# Patient Record
Sex: Female | Born: 1940 | Race: White | Hispanic: No | Smoking: Former smoker
Health system: Southern US, Community
[De-identification: ages and names within clinical notes are randomized; demographics above are authoritative.]

## PROBLEM LIST (undated history)

## (undated) DIAGNOSIS — I272 Pulmonary hypertension, unspecified: Secondary | ICD-10-CM

## (undated) DIAGNOSIS — E785 Hyperlipidemia, unspecified: Secondary | ICD-10-CM

## (undated) DIAGNOSIS — M169 Osteoarthritis of hip, unspecified: Secondary | ICD-10-CM

## (undated) DIAGNOSIS — I10 Essential (primary) hypertension: Secondary | ICD-10-CM

## (undated) DIAGNOSIS — Z9889 Other specified postprocedural states: Secondary | ICD-10-CM

## (undated) DIAGNOSIS — M19019 Primary osteoarthritis, unspecified shoulder: Secondary | ICD-10-CM

## (undated) DIAGNOSIS — G5600 Carpal tunnel syndrome, unspecified upper limb: Secondary | ICD-10-CM

## (undated) DIAGNOSIS — I340 Nonrheumatic mitral (valve) insufficiency: Secondary | ICD-10-CM

## (undated) DIAGNOSIS — I4891 Unspecified atrial fibrillation: Secondary | ICD-10-CM

## (undated) DIAGNOSIS — R600 Localized edema: Secondary | ICD-10-CM

## (undated) DIAGNOSIS — I38 Endocarditis, valve unspecified: Secondary | ICD-10-CM

## (undated) DIAGNOSIS — I5022 Chronic systolic (congestive) heart failure: Secondary | ICD-10-CM

## (undated) DIAGNOSIS — L03115 Cellulitis of right lower limb: Secondary | ICD-10-CM

## (undated) DIAGNOSIS — I872 Venous insufficiency (chronic) (peripheral): Secondary | ICD-10-CM

## (undated) HISTORY — DX: Chronic systolic (congestive) heart failure: I50.22

## (undated) HISTORY — DX: Venous insufficiency (chronic) (peripheral): I87.2

## (undated) HISTORY — DX: Localized edema: R60.0

## (undated) HISTORY — DX: Nonrheumatic mitral (valve) insufficiency: I34.0

## (undated) HISTORY — DX: Osteoarthritis of hip, unspecified: M16.9

## (undated) HISTORY — DX: Cellulitis of right lower limb: L03.115

## (undated) HISTORY — DX: Primary osteoarthritis, unspecified shoulder: M19.019

## (undated) HISTORY — DX: Essential (primary) hypertension: I10

## (undated) HISTORY — DX: Other specified postprocedural states: Z98.890

## (undated) HISTORY — PX: OTHER SURGICAL HISTORY: SHX169

## (undated) HISTORY — DX: Carpal tunnel syndrome, unspecified upper limb: G56.00

## (undated) HISTORY — PX: ABDOMINAL HYSTERECTOMY: SHX81

## (undated) HISTORY — DX: Unspecified atrial fibrillation: I48.91

---

## 2007-09-29 ENCOUNTER — Ambulatory Visit: Payer: Self-pay | Admitting: Family Medicine

## 2008-08-26 ENCOUNTER — Ambulatory Visit: Payer: Self-pay | Admitting: Cardiology

## 2008-08-26 ENCOUNTER — Ambulatory Visit: Payer: Self-pay | Admitting: Otolaryngology

## 2008-09-30 ENCOUNTER — Ambulatory Visit: Payer: Self-pay | Admitting: Otolaryngology

## 2009-02-26 ENCOUNTER — Emergency Department: Payer: Self-pay | Admitting: Unknown Physician Specialty

## 2012-05-04 ENCOUNTER — Emergency Department: Payer: Self-pay | Admitting: Internal Medicine

## 2012-05-07 ENCOUNTER — Ambulatory Visit: Payer: Self-pay | Admitting: Specialist

## 2012-05-07 LAB — HEMOGLOBIN: HGB: 13.9 g/dL (ref 12.0–16.0)

## 2012-05-09 ENCOUNTER — Ambulatory Visit: Payer: Self-pay | Admitting: Specialist

## 2014-05-05 ENCOUNTER — Ambulatory Visit: Payer: Self-pay | Admitting: General Practice

## 2014-05-05 LAB — CBC
HCT: 41.2 % (ref 35.0–47.0)
HGB: 13.2 g/dL (ref 12.0–16.0)
MCH: 28.5 pg (ref 26.0–34.0)
MCHC: 32.1 g/dL (ref 32.0–36.0)
MCV: 89 fL (ref 80–100)
Platelet: 152 10*3/uL (ref 150–440)
RBC: 4.65 10*6/uL (ref 3.80–5.20)
RDW: 14.5 % (ref 11.5–14.5)
WBC: 6.6 10*3/uL (ref 3.6–11.0)

## 2014-05-05 LAB — BASIC METABOLIC PANEL
ANION GAP: 6 — AB (ref 7–16)
BUN: 13 mg/dL (ref 7–18)
CO2: 34 mmol/L — AB (ref 21–32)
CREATININE: 0.74 mg/dL (ref 0.60–1.30)
Calcium, Total: 8.7 mg/dL (ref 8.5–10.1)
Chloride: 96 mmol/L — ABNORMAL LOW (ref 98–107)
EGFR (Non-African Amer.): >60
GLUCOSE: 120 mg/dL — AB (ref 65–99)
Osmolality: 273 (ref 275–301)
POTASSIUM: 4.1 mmol/L (ref 3.5–5.1)
Sodium: 136 mmol/L (ref 136–145)

## 2014-05-05 LAB — SEDIMENTATION RATE: ERYTHROCYTE SED RATE: 10 mm/h (ref 0–30)

## 2014-05-05 LAB — URINALYSIS, COMPLETE
BILIRUBIN, UR: NEGATIVE
Glucose,UR: NEGATIVE mg/dL (ref 0–75)
Ketone: NEGATIVE
Nitrite: POSITIVE
PROTEIN: NEGATIVE
Ph: 6 (ref 4.5–8.0)
Specific Gravity: 1.01 (ref 1.003–1.030)

## 2014-05-05 LAB — PROTIME-INR
INR: 1.1
PROTHROMBIN TIME: 14.4 s (ref 11.5–14.7)

## 2014-05-05 LAB — APTT: Activated PTT: 28.3 secs (ref 23.6–35.9)

## 2014-05-05 LAB — MRSA PCR SCREENING

## 2014-05-07 LAB — URINE CULTURE

## 2014-05-18 DIAGNOSIS — I5022 Chronic systolic (congestive) heart failure: Secondary | ICD-10-CM | POA: Insufficient documentation

## 2014-05-18 DIAGNOSIS — I340 Nonrheumatic mitral (valve) insufficiency: Secondary | ICD-10-CM

## 2014-05-18 HISTORY — DX: Nonrheumatic mitral (valve) insufficiency: I34.0

## 2014-05-18 HISTORY — DX: Chronic systolic (congestive) heart failure: I50.22

## 2014-06-08 ENCOUNTER — Ambulatory Visit: Payer: Self-pay | Admitting: General Practice

## 2014-06-08 DIAGNOSIS — M169 Osteoarthritis of hip, unspecified: Secondary | ICD-10-CM

## 2014-06-08 HISTORY — DX: Osteoarthritis of hip, unspecified: M16.9

## 2014-06-08 HISTORY — PX: JOINT REPLACEMENT: SHX530

## 2014-06-08 LAB — BASIC METABOLIC PANEL
Anion Gap: 6 — ABNORMAL LOW (ref 7–16)
BUN: 19 mg/dL — AB (ref 7–18)
CALCIUM: 9.7 mg/dL (ref 8.5–10.1)
CO2: 36 mmol/L — AB (ref 21–32)
CREATININE: 0.83 mg/dL (ref 0.60–1.30)
Chloride: 94 mmol/L — ABNORMAL LOW (ref 98–107)
Glucose: 100 mg/dL — ABNORMAL HIGH (ref 65–99)
Osmolality: 274 (ref 275–301)
Potassium: 3.9 mmol/L (ref 3.5–5.1)
SODIUM: 136 mmol/L (ref 136–145)

## 2014-06-08 LAB — URINALYSIS, COMPLETE
BILIRUBIN, UR: NEGATIVE
BLOOD: NEGATIVE
Bacteria: NONE SEEN
Glucose,UR: NEGATIVE mg/dL (ref 0–75)
KETONE: NEGATIVE
LEUKOCYTE ESTERASE: NEGATIVE
Nitrite: NEGATIVE
PH: 5 (ref 4.5–8.0)
Protein: NEGATIVE
RBC,UR: <1 /HPF (ref 0–5)
SPECIFIC GRAVITY: 1.017 (ref 1.003–1.030)
Squamous Epithelial: 2

## 2014-06-08 LAB — CBC
HCT: 45.4 % (ref 35.0–47.0)
HGB: 14.6 g/dL (ref 12.0–16.0)
MCH: 28.8 pg (ref 26.0–34.0)
MCHC: 32.1 g/dL (ref 32.0–36.0)
MCV: 90 fL (ref 80–100)
PLATELETS: 162 10*3/uL (ref 150–440)
RBC: 5.07 10*6/uL (ref 3.80–5.20)
RDW: 14.8 % — AB (ref 11.5–14.5)
WBC: 7.6 10*3/uL (ref 3.6–11.0)

## 2014-06-08 LAB — APTT: Activated PTT: 28.6 secs (ref 23.6–35.9)

## 2014-06-08 LAB — SEDIMENTATION RATE: Erythrocyte Sed Rate: 14 mm/hr (ref 0–30)

## 2014-06-08 LAB — PROTIME-INR
INR: 1.1
PROTHROMBIN TIME: 13.9 s (ref 11.5–14.7)

## 2014-06-08 LAB — MRSA PCR SCREENING

## 2014-06-09 LAB — URINE CULTURE

## 2014-06-23 ENCOUNTER — Inpatient Hospital Stay: Payer: Self-pay | Admitting: General Practice

## 2014-06-24 LAB — BASIC METABOLIC PANEL
Anion Gap: 5 — ABNORMAL LOW (ref 7–16)
BUN: 10 mg/dL (ref 7–18)
CALCIUM: 8.2 mg/dL — AB (ref 8.5–10.1)
CHLORIDE: 96 mmol/L — AB (ref 98–107)
Co2: 31 mmol/L (ref 21–32)
Creatinine: 0.97 mg/dL (ref 0.60–1.30)
EGFR (African American): >60
GFR CALC NON AF AMER: 60 — AB
Glucose: 122 mg/dL — ABNORMAL HIGH (ref 65–99)
Osmolality: 265 (ref 275–301)
Potassium: 3.5 mmol/L (ref 3.5–5.1)
SODIUM: 132 mmol/L — AB (ref 136–145)

## 2014-06-24 LAB — PLATELET COUNT: PLATELETS: 112 10*3/uL — AB (ref 150–440)

## 2014-06-24 LAB — HEMOGLOBIN: HGB: 10.9 g/dL — ABNORMAL LOW (ref 12.0–16.0)

## 2014-06-25 LAB — BASIC METABOLIC PANEL WITH GFR
Anion Gap: 8 (ref 7–16)
BUN: 10 mg/dL (ref 7–18)
Calcium, Total: 8.1 mg/dL — ABNORMAL LOW (ref 8.5–10.1)
Chloride: 94 mmol/L — ABNORMAL LOW (ref 98–107)
Co2: 30 mmol/L (ref 21–32)
Creatinine: 0.8 mg/dL (ref 0.60–1.30)
EGFR (African American): >60
EGFR (Non-African Amer.): >60
Glucose: 98 mg/dL (ref 65–99)
Osmolality: 264 (ref 275–301)
Potassium: 3.2 mmol/L — ABNORMAL LOW (ref 3.5–5.1)
Sodium: 132 mmol/L — ABNORMAL LOW (ref 136–145)

## 2014-06-25 LAB — HEMOGLOBIN: HGB: 10 g/dL — ABNORMAL LOW (ref 12.0–16.0)

## 2014-06-25 LAB — PLATELET COUNT: Platelet: 92 x10 3/mm 3 — ABNORMAL LOW (ref 150–440)

## 2014-06-26 LAB — BASIC METABOLIC PANEL
Anion Gap: 6 — ABNORMAL LOW (ref 7–16)
BUN: 12 mg/dL (ref 7–18)
CALCIUM: 8 mg/dL — AB (ref 8.5–10.1)
CHLORIDE: 94 mmol/L — AB (ref 98–107)
CREATININE: 0.81 mg/dL (ref 0.60–1.30)
Co2: 32 mmol/L (ref 21–32)
EGFR (Non-African Amer.): >60
Glucose: 104 mg/dL — ABNORMAL HIGH (ref 65–99)
Osmolality: 265 (ref 275–301)
POTASSIUM: 3.7 mmol/L (ref 3.5–5.1)
SODIUM: 132 mmol/L — AB (ref 136–145)

## 2014-06-27 ENCOUNTER — Encounter: Payer: Self-pay | Admitting: Internal Medicine

## 2014-07-06 LAB — CBC WITH DIFFERENTIAL/PLATELET
Basophil #: 0 10*3/uL (ref 0.0–0.1)
Basophil %: 0.5 %
Eosinophil #: 0.2 10*3/uL (ref 0.0–0.7)
Eosinophil %: 2.8 %
HCT: 36.1 % (ref 35.0–47.0)
HGB: 11.8 g/dL — AB (ref 12.0–16.0)
LYMPHS PCT: 25.5 %
Lymphocyte #: 1.9 10*3/uL (ref 1.0–3.6)
MCH: 29.7 pg (ref 26.0–34.0)
MCHC: 32.7 g/dL (ref 32.0–36.0)
MCV: 91 fL (ref 80–100)
MONOS PCT: 7.3 %
Monocyte #: 0.6 x10 3/mm (ref 0.2–0.9)
NEUTROS ABS: 4.9 10*3/uL (ref 1.4–6.5)
NEUTROS PCT: 63.9 %
Platelet: 308 10*3/uL (ref 150–440)
RBC: 3.97 10*6/uL (ref 3.80–5.20)
RDW: 15.3 % — ABNORMAL HIGH (ref 11.5–14.5)
WBC: 7.6 10*3/uL (ref 3.6–11.0)

## 2014-07-06 LAB — BASIC METABOLIC PANEL
Anion Gap: 3 — ABNORMAL LOW (ref 7–16)
BUN: 9 mg/dL (ref 7–18)
CHLORIDE: 91 mmol/L — AB (ref 98–107)
CREATININE: 0.83 mg/dL (ref 0.60–1.30)
Calcium, Total: 8.8 mg/dL (ref 8.5–10.1)
Co2: 38 mmol/L — ABNORMAL HIGH (ref 21–32)
EGFR (African American): >60
EGFR (Non-African Amer.): >60
Glucose: 94 mg/dL (ref 65–99)
Osmolality: 263 (ref 275–301)
POTASSIUM: 3.4 mmol/L — AB (ref 3.5–5.1)
Sodium: 132 mmol/L — ABNORMAL LOW (ref 136–145)

## 2014-07-09 ENCOUNTER — Encounter: Payer: Self-pay | Admitting: Internal Medicine

## 2014-07-13 LAB — BASIC METABOLIC PANEL
ANION GAP: 6 — AB (ref 7–16)
BUN: 11 mg/dL (ref 7–18)
Calcium, Total: 9.1 mg/dL (ref 8.5–10.1)
Chloride: 94 mmol/L — ABNORMAL LOW (ref 98–107)
Co2: 34 mmol/L — ABNORMAL HIGH (ref 21–32)
Creatinine: 0.86 mg/dL (ref 0.60–1.30)
Glucose: 86 mg/dL (ref 65–99)
Osmolality: 267 (ref 275–301)
Potassium: 3.7 mmol/L (ref 3.5–5.1)
SODIUM: 134 mmol/L — AB (ref 136–145)

## 2014-08-22 ENCOUNTER — Inpatient Hospital Stay: Payer: Self-pay | Admitting: Internal Medicine

## 2014-08-27 ENCOUNTER — Encounter: Payer: Self-pay | Admitting: Internal Medicine

## 2014-09-07 ENCOUNTER — Encounter
Admit: 2014-09-07 | Disposition: A | Discharge status: Home / Self Care | Payer: Self-pay | Attending: Internal Medicine | Admitting: Internal Medicine

## 2014-09-13 DIAGNOSIS — IMO0002 Reserved for concepts with insufficient information to code with codable children: Secondary | ICD-10-CM | POA: Insufficient documentation

## 2014-09-13 DIAGNOSIS — E782 Mixed hyperlipidemia: Secondary | ICD-10-CM | POA: Insufficient documentation

## 2014-09-13 DIAGNOSIS — I890 Lymphedema, not elsewhere classified: Secondary | ICD-10-CM | POA: Insufficient documentation

## 2014-09-13 DIAGNOSIS — R7881 Bacteremia: Secondary | ICD-10-CM | POA: Insufficient documentation

## 2014-09-19 DIAGNOSIS — Z9889 Other specified postprocedural states: Secondary | ICD-10-CM | POA: Insufficient documentation

## 2014-09-19 HISTORY — DX: Other specified postprocedural states: Z98.890

## 2014-10-08 ENCOUNTER — Encounter
Admit: 2014-10-08 | Disposition: A | Discharge status: Home / Self Care | Payer: Self-pay | Attending: Internal Medicine | Admitting: Internal Medicine

## 2014-10-25 DIAGNOSIS — I10 Essential (primary) hypertension: Secondary | ICD-10-CM | POA: Insufficient documentation

## 2014-10-25 HISTORY — DX: Essential (primary) hypertension: I10

## 2014-10-26 NOTE — Consult Note (Signed)
Brief Consult Note: Diagnosis: Left distal radius fx.   Patient was seen by consultant.   Consult note dictated.   Recommend to proceed with surgery or procedure.   Recommend further assessment or treatment.   Orders entered.   Discussed with Attending MD.   Comments: 74 [facility]; fall onto left wrist; had immediate onset of pain and deformity about the left DR; denies open wounds, bleeding or exposed bone; RHD; works at a [employer];  PE AFVSS Left UE swelling and deformity noted about the left distal radius small dorsal abrasion over the 1st metacarpal; no active bleeding no exposed bone no evidence of an open fx;  decreased sensation to light touch in the median n dist; radial and ulnar nerves; motor function appears intact in the r/u/m nerves but difficult to determine secondary to guarding due to pain cap refill < 2sec in all 5 digits and warm;  xray show a dorsally displaced and comminuted fx of the left distal radius with intra-articular extension  A/P 74 yo female with comminuted intra-articular left distal radius fx consent for hematoma block and closed reduction reduce and splint post reduction xrays f/u in next 3 days with Dr. Katrinka BlazingSmith for further eval and possible surgical intervention keep splint clean and dry ice and elevate the left UE to help with swelling and pain control.  Electronic Signatures: Francis Gainesalderon, [place] Maulden D (MD)  (Signed 27-Oct-13 14:30)  Authored: Brief Consult Note   Last Updated: 27-Oct-13 14:30 by Francis Gainesalderon, Drusilla Wampole D (MD)

## 2014-10-26 NOTE — Op Note (Signed)
PATIENT NAME:  [name], [name] MR#:  [id number] DATE OF BIRTH:  [birth year]  DATE OF PROCEDURE:  05/09/2012  PREOPERATIVE DIAGNOSIS: Severely comminuted distal left radius fracture.   POSTOPERATIVE DIAGNOSIS: Severely comminuted distal left radius fracture.  PROCEDURE: Closed reduction and application of external fixator distal left radius fracture.   SURGEON: Myra Rudehristopher Lamaya Hyneman, MD   ANESTHESIA: General.   COMPLICATIONS: None.   DESCRIPTION OF PROCEDURE: One gram of Ancef was given intravenously prior to the procedure. General anesthesia is induced. The left upper extremity is thoroughly prepped with alcohol and ChloraPrep and draped in standard sterile fashion. The fracture is reduced with the usual reduction maneuver under FluoroScan control. Two parallel external fixator pins are inserted into the second metacarpal and then also into the midportion of the radial shaft. The fingers were placed in the finger traps with 10 pounds longitudinal traction. The reduction is checked in the AP and lateral views and was seen to be satisfactory. The external fixator was then applied in the distracted position and secured. Final AP and lateral views demonstrated satisfactory positioning given the degree of comminution of this fracture. Betadine dressings were applied to the pin sites, and a soft bulky dressing is applied. The patient is awakened and returned to the recovery room in satisfactory condition having tolerated the procedure quite well.  ____________________________ Marie Gandyhristopher E. Maximo Spratling, MD ces:cbb D: 05/09/2012 11:51:07 ET T: 05/09/2012 12:12:35 ET JOB#: [id number]  cc: Marie Gandyhristopher E. Zadaya Cuadra, MD, <Dictator> Marie GandyHRISTOPHER E Tailer Volkert MD ELECTRONICALLY SIGNED 05/09/2012 16:16

## 2014-10-26 NOTE — Consult Note (Signed)
PATIENT NAME:  [name], [name] MR#:  [id number] DATE OF BIRTH:  09-06-1940  DATE OF CONSULTATION:  05/04/2012  ER REFERRING PHYSICIAN:   Dr. Mindi Junker CONSULTING PHYSICIAN:  Dimas Alexandria. Jeanelle Malling, MD  CHIEF COMPLAINT: Left wrist pain.   HISTORY OF PRESENT ILLNESS: This is a pleasant 74 year old female who was in her usual state of health until this afternoon when she was exiting a [employer] where she slipped on some wet pavement, fell to the ground, tried to break her fall with her left upper extremity and upon impact had immediate onset of deformity and pain about the left distal wrist. She denies any exposed bone or bleeding or open wounds noted at that time. She was brought to Emergency Department, evaluated, and found to have a left intraarticular comminuted distal radius fracture and I was consulted. She is right-hand dominant.   PAST MEDICAL HISTORY: Varicose veins, hypertension, brain tumor, blood clots.   PAST SURGICAL HISTORY: Significant for right eye surgery, right wrist surgery, and partial hysterectomy.   CURRENT MEDICATIONS:  1. Tylenol. 2. Ibuprofen. 3. Carvedilol. 4. ASA 81 mg.   ALLERGIES: No known drug allergies.   SOCIAL HISTORY: The patient works at a winery putting labels on wine bottles and is right-hand dominant.   PHYSICAL EXAMINATION:  VITAL SIGNS: She is afebrile. Vital signs stable.   LEFT UPPER EXTREMITY:  Focused examination of the left upper extremity shows shoulder and elbow are intact with good and pain-free range of motion. She is able to move all digits. Motor function is intact in the radial, ulnar, and median nerve distribution. There is some numbness to light touch in the median nerve distribution. The radial and ulnar nerve distribution appear intact. Motor function appears intact, however is limited secondary to pain. There is a small abrasion noted distal to the fracture area over the base of the first metacarpal. This is not actively bleeding and does  not communicate with the fracture, and thus not an open fracture. She has capillary refill less than 2 seconds in all five digits and all digits are warm. She is tender to palpation over the area of the fracture. The remainder of her left upper extremity is intact with no tenderness to palpation. No known lymphadenopathy.   X-RAYS: X-rays of the left distal radius were reviewed and show a dorsally displaced and angulated comminuted and intraarticular left distal radius fracture.   ASSESSMENT: This is a 74 year old female with a left intraarticular comminuted distal radius fracture.   PLAN: We will go in consent for hematoma block and closed reduction. After this we will splint her.  We will have her ice and elevate the left upper extremity at home.  She will need to follow up with Dr. Katrinka Blazing in the next three days for further evaluation and x-rays to determine need for surgical intervention. I discussed all the above with the patient and she voiced her understanding and agreed with the above-noted plan.   PROCEDURE NOTE: I discussed the risks, benefits, and alternatives to a hematoma block and closed reduction of the left distal radius. The patient had an opportunity to discuss the procedure. She had an opportunity to ask questions, was given satisfactory answers, and then signed a written consent for a hematoma block and closed reduction of the left distal radius.   The area over the left distal radius was prepped using an alcohol swab and then approximately 10 mL of 1% lidocaine were injected into the fracture site. The needle was withdrawn and  an alcohol swab was replaced and pressure was applied.   Once the lidocaine had set up and provided adequate anesthesia, the left distal radius was closed reduced using longitudinal traction, recreation of the injury, and a volar-directed force and angulation. Mini C-arm was used to evaluate the reduction to near anatomic alignment. There was significant  comminution both dorsally and intra-articularly. Her radial length and inclination were returned to normal. Her radial/volar tilt was returned to neutral. The patient was then placed in a well-padded sugar tong splint.   The patient tolerated the procedure well.   There were no complications.   Postreduction physical exam of the left upper extremity showed continued mild numbness in the median nerve distribution with intact sensation in the radial and ulnar nerve distribution. Motor function had significantly increased after the reduction in all three nerve distributions- radial, median, and ulnar nerves.   The patient was then sent for postreduction x-rays. Those were pending at the time of this dictation.    ____________________________ Dimas Alexandriaoberto D. Jeanelle Mallingalderon, MD rdc:bjt D: 05/04/2012 14:38:38 ET T: 05/04/2012 15:04:05 ET JOB#: [id number]  cc: Dimas Alexandriaoberto D. Jeanelle Mallingalderon, MD, <Dictator> Francis GainesOBERTO D Madolin Twaddle MD ELECTRONICALLY SIGNED 05/08/2012 9:43

## 2014-10-30 NOTE — Op Note (Signed)
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   PROCEDURE PERFORMED: Left total hip arthroplasty.   SURGEON: Illene Labrador. Angie Fava., MD.   ASSISTANTMarland Kitchen Van Clines, PA (required to maintain retraction throughout the procedure).   ANESTHESIA: Spinal.   ESTIMATED BLOOD LOSS: 150 mL.   FLUIDS REPLACED: 800 mL of crystalloid.   DRAINS: Two medium drains to Hemovac reservoir.   IMPLANTS UTILIZED: DePuy 19.5 mm small stature AML femoral stem, 56 mm outer diameter Pinnacle 100 acetabular shell, +4 mm neutral Pinnacle Marathon polyethylene liner, and a 36 mm M-SPEC femoral head with a +1.5 mm neck length.   INDICATIONS FOR SURGERY: The patient is a 74 year old female who was seen for complaints of severe progressive left hip and groin pain. X-rays demonstrate severe degenerative changes with bone-on-bone articulation appreciated. After discussion of the risks and benefits of surgical intervention, the patient expressed understanding of the risks and benefits, and agreed with plans for surgical intervention.   PROCEDURE IN DETAIL: The patient was brought into the Operating Room, and after adequate spinal anesthesia was achieved, the patient was placed a right lateral decubitus position. Axillary roll was placed and all bony prominences were well padded. The patient's left hip and leg were cleaned and prepped with alcohol and DuraPrep, draped in the usual sterile fashion. A "timeout" was performed as per usual protocol. A lateral curvilinear incision was made gently curving towards the posterior superior iliac spine. IT band was incised in line with the skin incision and fibers of the gluteus maximus were split in line. Piriformis tendon was identified, skeletonized, incised at its insertion on the  proximal femur and reflected posteriorly. In a similar fashion, short external rotators were incised and reflected posteriorly. A T-Type posterior capsulotomy was performed. Prior to dislocation of the femoral head, a threaded Steinmann pin was inserted through a separate stab incision into the pelvis, superior to the acetabulum and then bent in the form of a stylus so as to assess limb length and hip offset throughout the procedure. The femoral head was then dislocated posteriorly. Severe degenerative changes were noted with full-thickness loss of articular cartilage and gross irregularity of the surface of the femoral head. The femoral neck cut was performed using an oscillating saw. The anterior capsule was elevated off the femoral neck. Inspection of the acetabulum also demonstrated severe degenerative changes. The labrum was excised using electrocautery. The acetabulum was reamed in a sequential fashion up to a 55 mm diameter. This allowed for good punctate bleeding bone. A 56 mm Pinnacle 100 acetabular component was positioned and impacted into place. Good scratch fit was appreciated. A +4 mm neutral polyethylene trial was inserted and attention was directed to the proximal femur. Pilot hole for reaming of the proximal femoral canal was created using a high-speed bur. Proximal femoral canal was reamed in a sequential fashion up to a 19 mm  diameter. Given the quality of the bone, it was subsequently elected to ream line-to-line to a 19.5 mm in anticipation of placement of a 19.5 mm stem. Proximal femur was then prepared using 19.5 mm aggressive side-biting reamer. Serial broaches were inserted up to a 19.5 mm small stature broach. The calcar region was planed and a 36 mm hip ball with a +1.5 mm neck length was placed on the trunnion and  the hip was reduced. Good equalization of limb lengths was appreciated and excellent stability was appreciated both anteriorly and posteriorly. The hip was then dislocated and  the trial components removed. The acetabular shell was irrigated with normal saline with antibiotic solution and then suctioned dry. A +4 mm neutral Pinnacle Marathon polyethylene liner was positioned and impacted into place. Next, a 19.5 mm small stature AML femoral component was positioned and impacted into place. Good scratch fit was appreciated. Trial reduction was again performed with a 36 mm hip ball with a +1.5 mm neck length. Again, good equalization of limb lengths and excellent anterior and posterior stability was appreciated. Trial hip ball was removed. [facility] was cleaned and dried and a 36 mm M-SPEC femoral head with a +1.5 mm neck length was placed on the trunnion and impacted into place. The hip was reduced and placed through a range of motion. Excellent stability and equalization of limb lengths was appreciated.   The wound was irrigated with copious amounts of normal saline with antibiotic solution using pulsatile lavage and then suctioned dry. Good hemostasis was appreciated. The posterior capsulotomy was repaired using #5 Ethibond. The piriformis tendon was reapproximated on the surface of the gluteus medius tendon using #5 Ethibond. Two medium drains were placed in the wound bed and brought out through a separate stab incision to be attached to a reinfusion system. The IT band was repaired using interrupted sutures of #1 Vicryl. The subcutaneous tissue was approximated in layers using first #0 Vicryl, followed by #2-0 Vicryl. Skin was closed with skin staples. Sterile dressing was applied.   The patient tolerated the procedure well. She was transported to the recovery room in stable condition.   ____________________________ Illene LabradorJames P. Angie FavaHooten Jr., MD jph:mw D: 06/24/2014 06:24:15 ET T: 06/24/2014 11:12:55 ET JOB#: [id number]  cc: Illene LabradorJames P. Angie FavaHooten Jr., MD, <Dictator> Illene LabradorJAMES P Angie FavaHOOTEN JR MD ELECTRONICALLY SIGNED 06/27/2014 18:18

## 2014-11-01 LAB — SURGICAL PATHOLOGY

## 2014-11-03 DIAGNOSIS — I872 Venous insufficiency (chronic) (peripheral): Secondary | ICD-10-CM | POA: Insufficient documentation

## 2014-11-03 NOTE — Discharge Summary (Signed)
PATIENT NAME:  Marie MaplesSIMMONS, Milla L MR#:  [id number] DATE OF BIRTH:  03/01/1941  DATE OF ADMISSION:  06/23/2014 DATE OF DISCHARGE:  06/26/2014  ADMITTING DIAGNOSIS: Degenerative arthrosis of the left hip.   DISCHARGE DIAGNOSIS: Degenerative arthrosis of the left hip.   OPERATION: On 06/23/2014, the patient had a left total hip arthroplasty.   SURGEON: Francesco SorJames Hooten, M.D.   ASSISTANT: Van ClinesJon Wolfe, [facility]   ANESTHESIA: Spinal.   ESTIMATED BLOOD LOSS:  Was 150 mL.   IMPLANTS USED: DePuy 19.5 mm small stature, AML, femoral stem, 56 mm outer diameter, Pinnacle 100 acetabular shell, +4 mm neutral Pinnacle Marathon polyethylene liner, 36 mm M-Spec femoral head with +1.5 mm neck length. The patient was stabilized, brought to the recovery room and then brought down to the orthopedic floor.   HISTORY: The patient is a 74 year old female, who presented for an upcoming total hip replacement on the left side on 06/23/2014. The patient has been refractory to conservative treatments continued to have groin pain, difficulties with activities of daily living.   PHYSICAL EXAMINATION: GENERAL: Well-developed, well-nourished female with an antalgic gait and a hip lurch on the left.  LUNGS: Clear to auscultation.  CARDIAC: Regular rate and rhythm with no murmur.  MUSCULOSKELETAL: In regard to the left hip, the patient has soft tissue swelling with tenderness on the anterior aspect of the hip joint. The patient has range of motion, full extension, 90 degrees of flexion; the patient has 10 degrees internal rotation, and 20 degrees external rotation.   HOSPITAL COURSE: After initial admission on 06/23/2014, the patient was brought to the orthopedic floor. On postoperative day 1, the patient had the hemoglobin of 10.9, which went to 10.0, on postoperative day 2. The patient did well and is ready to go to rehab on 06/26/2014.   CONDITION AT DISCHARGE: Stable.   DISPOSITION: The patient was sent to rehab.   DISCHARGE  INSTRUCTIONS: The patient will do weight bear as tolerated on the affected leg. The patient will elevate her leg with 1 to 2 pillows. The patient will use knee-high TED hose on both legs to be removed 1 hour every 8 hour shift. The patient will elevate her heels off the bed and use incentive spirometer. The patient was encouraged to do cough and deep breathing. The patient will do a regular diet. The patient will try not to get her dressing dirty or wet. The patient will have her dressing left on and then changed on a p.r.n. basis. The patient will have her staples removed around December 30th, with application of Steri-Strips and benzoin. The patient will call the clinic if there is any bright red bleeding, calf pain, or bowel or bladder difficulty, or any fever greater than 101.5. The patient will do physical therapy and occupational therapy per protocol. The patient is not to get her staples wet or take any shower until her staples are out. The patient does have a follow up on 08/05/2014.   DISCHARGE MEDICATIONS:  Carvedilol 25 mg 1 tablet b.i.d., Tylenol 500 mg 2 tablets q. 6 hours as needed for pain, furosemide 80 mg 1 tablet daily; tramadol 50 mg 1 to 2 tablets every 4 hours as needed for pain that is mild to moderate; Lovenox 40 mg subcutaneous once a day for 14 days, then discontinue, and start of 81 mg aspirin once a day; oxycodone 5 mg 1 to 2 tablets every 4 hours as needed for severe pain; milk of magnesia 30 mL b.i.d. or p.r.n., bisacodyl  10 mg per rectally p.r.n. for constipation, Senokot-S 1 tablet b.i.d., pantoprazole 40 mg 1 tablet b.i.d.    ____________________________ J. Dedra Skeens, [place] jtm:nt D: 06/28/2014 15:02:10 ET T: 06/28/2014 15:19:24 ET JOB#: [id number]  cc: J. Dedra Skeens, [place], <Dictator> J Dequarius Jeffries [facility] PA ELECTRONICALLY SIGNED 07/13/2014 11:53

## 2014-11-07 NOTE — Discharge Summary (Signed)
PATIENT NAME:  [name] MR#:  [name] DATE OF BIRTH:  Mar 05, 1941  DATE OF ADMISSION:  08/22/2014 DATE OF DISCHARGE:  08/27/2014 (anticipated)   ADMITTING DIAGNOSES:  1.  Sepsis with tachycardia, tachypnea and leukocytosis probably secondary to right lower extremity cellulitis.  2.  Chronic atrial fibrillation with rapid ventricular response.  3.  Encephalopathy. 4.  Hypertension.   DISCHARGE DIAGNOSES:  1.  Sepsis with right lower extremity cellulitis and group B streptococcus bacteremia.  2.  Acute on chronic atrial fibrillation with rapid ventricular response.  3.  Encephalopathy, resolved.  4.  Hypertension, stable.   PROCEDURES: None.   CONSULTATIONS: Infectious disease Dr. Sampson GoonFitzgerald, neurology and palliative care.     BRIEF HISTORY OF PRESENT ILLNESS AND HOSPITAL COURSE: The patient is a 74 year old female. 1.  Sepsis. The patient is a 74 year old female brought in to the ED for shortness of breath and weakness. The patient was having problems with worsening weakness associated with shortness of breath for 2 weeks and at the time of arrival, the patient was hypoxic and she was requiring supplemental oxygen to maintain her saturation greater than 92%. Temperature was 104 degrees Fahrenheit at home, but at the hospital she was afebrile. Tachycardic with heart rate 130s to 140s.  The sepsis was thought to be from right lower extremity cellulitis. The patient has received aggressive hydration with IV fluids. Blood cultures were obtained and she was started on IV vancomycin. The patient was found to be encephalopathic and found to be lethargic during the hospital course. Her blood cultures were also positive with some gram positive cocci. Delirium was thought to be from sepsis from right lower extremity cellulitis bacteremia. However, as the patient has been persistently lethargic, a CAT scan of the head was done which was negative.  Cardiac enzymes were negative. A 2-D echocardiogram  was performed which was also normal. Her TSH level was normal. As there was a great concern about the patient's lethargy, ID consult, neurology consult and palliative care consult was placed.  MRA of the brain was ordered, but as per my discussion with neurology, it was deferred. The patient was evaluated by Dr. Sampson GoonFitzgerald as well and he has recommended to discontinue Zosyn, Levaquin and recommended to continue Rocephin and to change to Keflex 500 mg for a total of 10 day course.  Eventually the patient's delirium was resolved and the patient started feeling better. Physical therapy has evaluated the patient and they have recommended a skilled nursing facility for rehabilitation. The patient will be continued with Keflex 500 mg p.o. every 8 hours for the next 5 days as she already has finished the IV antibiotics for 5 days.  2.  Chronic atrial fibrillation with RVR with IV fluids the rate is improved. We will continue her home medications, including Coreg and aspirin.  3.  Hypertension. Blood pressure is stable. We will continue her home medication of Coreg.    Overall condition is stable and the patient is getting transferred to a skilled nursing facility soon as a bed is available.  VITAL SIGNS: Today, temperature 97.8, pulse 82, respirations 20, blood pressure (Dictation Anomaly) <<MISSING TEXT>, pulse oximetry 95%.  GENERAL APPEARANCE: Not in acute distress. Moderately built and nourished.  HEENT: Normocephalic, atraumatic. Pupils are equally reactive taken to light and accommodation. No scleral icterus. No conjunctival injection. No sinus tenderness. Moist mucous membranes.  NECK: Supple. No JVD. No thyromegaly. No masses.  LUNGS: Clear to auscultation bilaterally, no accessory muscle usage.  No anterior  chest wall tenderness on palpation.   CARDIOVASCULAR: Irregularly irregular.  GASTROINTESTINAL: Soft.  Bowel sounds are positive in all 4 quadrants. Nontender. Nondistended.  No masse.    NEUROLOGIC:  More awake and alert, oriented x 3. Answer questions appropriately, following verbal commands. Reflexes are 2+.  EXTREMITIES: Positive lymphedema right lower extremity erythema and edema significantly improved, still 1+ pitting edema is present. No cyanosis. No clubbing.  LABORATORY AND IMAGING STUDIES: On 08/26/2014 glucose is 93, BUN and creatinine are normal. Sodium is 134, potassium 3.5, chloride is 89, GFR greater than 60. Anion gap is 1. Serum osmolality and calcium are normal. TSH is normal.  WBC 7.3, hemoglobin 11.1, hematocrit 36.0, platelet count is at 128. Blood cultures collected on 08/22/2014 have revealed Streptococcus agalactiae, sensitivity to levofloxacin, ampicillin and vancomycin.  Urine culture 1000 colony-forming units of gram-negative rods.  Repeat blood culture on 08/25/2014 has revealed no growth x 2.  CAT scan of the head performed on 08/24/2014 no acute intracranial abnormalities, old lacunar infarct, paranasal sinus disease with evidence of acute maxillary sinusitis bilaterally.   MEDICATIONS AT THE TIME OF DISCHARGE: Coreg 25 mg 1 tablet p.o. 2 times a day, furosemide 80 mg p.o. once daily, aspirin 81 mg p.o. once daily, Colace 100 mg p.o. 2 times a day, Proventil 2 puffs inhalation every 4 hours as needed for shortness of breath, Keflex 500 mg 1 capsule p.o. 2 times a day for 5 days.   DIET: Low sodium.   ACTIVITY: As tolerated, as recommended by physical therapy.   FOLLOW-UP:  With primary care physician in 1 week and infectious disease as-needed basis in 2 to 4 weeks.   The patient will be transferred to a skilled nursing facility as soon as a bed is available.  The diagnosis and plan of care was discussed in detail with the patient and her family members.  They all verbalized understanding of the plan.   CODE STATUS: She is FULL CODE.   TOTAL TIME SPENT ON THE DISCHARGE: 40 minutes.     ____________________________ Ramonita Lab,  MD ag:DT D: 08/27/2014 13:17:53 ET T: 08/27/2014 13:58:23 ET JOB#: [id number]  cc: Ramonita Lab, MD, <Dictator> Ramonita Lab MD ELECTRONICALLY SIGNED 09/03/2014 15:06

## 2014-11-07 NOTE — Consult Note (Signed)
Referring Physician:  Lytle Butte :   Reason for Consult: Admit Date: 22-Aug-2014  Chief Complaint: "The food sucks"  Reason for Consult: altered mental status   History of Present Illness: History of Present Illness:   [name] is a 74 yo woman with PMH notable for HTN, atrial fibrillation, and prior TIA who presented with weakness, shortness of breath, and confusion and was noted to have sepsis in the setting of cellulitis and UTI. Blood cultures have since returned positive for Strep, presumably from her cellulitis. The Neurology service has been consulted to assist in evaluation of ongoing confusion and fluctuating level of arousal.  has been confused and disoriented since admission. Per family, has been excessively somnolent. Last night had an episode of near-unresponsiveness and required sternal rub for arousal. Brain CT x 2 (including after RRT for unresponsiveness) has showed no acute intracranial process.  ROS:  Review of Systems   Unable to accurately obtain due to the patient's altered mental status.  Past Medical/Surgical Hx:  Varicose Veins:   Hypertension:   Blood Clots:   L Hip Surgery:   Eye Surgery - Right:   Wrist Surgery - Right:   Hysterectomy - Partial:   Home Medications: Medication Instructions Last Modified Date/Time  Aspirin Enteric Coated 81 mg oral delayed release tablet 1 tab(s) orally once a day 14-Feb-16 18:26  carvedilol 25 mg oral tablet 1 tab(s) orally 2 times a day 14-Feb-16 18:26  furosemide 80 mg oral tablet 1 tab(s) orally once a day (in the morning) 14-Feb-16 18:26   Allergies:  No Known Allergies:   Social/Family History: Social History: Denies smoking, EtOH, or illicit drug use  Family History: No FH of stroke or seizure   Vital Signs: **Vital Signs.:   17-Feb-16 13:07  Temperature Temperature (F) 99.7  Celsius 37.6  Temperature Source axillary  Pulse Pulse 101  Respirations Respirations 22  Systolic BP Systolic BP 423   Diastolic BP (mmHg) Diastolic BP (mmHg) 88  Mean BP 107  Pulse Ox % Pulse Ox % 95  Pulse Ox Activity Level  At rest  Oxygen Delivery 2L   EXAM: Well-developed, well-nourished, in NAD. No conjunctival injection or scleral edema. Oropharynx clear with poor dentition. No carotid bruits auscultated. Normal S1, S2 and irregular cardiac rhythm on exam. Lungs clear to auscultation bilaterally. Abdomen soft and nontender. Peripheral pulses difficult to palpate in legs; radial pulses palpable. 3+ pitting edema to bilateral legs, with warmth and erythema to right shin.  MENTAL STATUS: Alert and oriented to person, place, and month (year is "19-something"). Language fluent and appropriate. Fund of knowledge is poor and her thought process is relatively disorganized and disinhibited (kept asking me about my gender despite my clearly quite manly beard). CRANIAL NERVES: Visual fields full to confrontation. PERRL. EOMI. Facial sensation intact. Facial muscles full and symmetric. Hearing intact to finger rub. Uvula midline with symmetric palatal elevation. Tongue midline without fasciculations. MOTOR: Normal bulk and tone. Strength 5/5 in deltoids, biceps, triceps, wrist flexors and extensors, and hand intrinsics bilaterally. Strength 5/5 in iliopsoas, glutes, hamstrings, quads, and tib ant bilaterally. REFLEXES: trace in biceps, triceps, patella, and achilles bilaterally. Flexor plantar responses bilaterally. SENSORY: Intact to vibration and pinprick throughout without extinction. COORDINATION: No ataxia or dysmetria on finger-nose maneuvers; unable to perform heel-shin GAIT: Not assessed.  Lab Results: LabObservation:  15-Feb-16 07:26   OBSERVATION Reason for Test  Hepatic:  14-Feb-16 18:24   Bilirubin, Total 0.7  Alkaline Phosphatase  128  SGPT (ALT)  17  SGOT (AST) 31  Total Protein, Serum 7.9  Albumin, Serum 3.5  TDMs:  16-Feb-16 14:15   Vancomycin, Trough LAB 11 (Result(s) reported on 24 Aug 2014 at 03:01PM.)  Routine Micro:  14-Feb-16 18:24   Organism Name Streptococcus agalactiae  Ampicillin Sensitivity S  Clindamycin Sensitivity R  Vancomycin Sensitivity S  Levofloxacin Sensitivity S  Linezolid Sensitivity S  Micro Text Report BLOOD CULTURE   ORGANISM 1                Streptococcus agalactiae   COMMENT                   IN AEROBIC BOTTLE ONLY   GRAM STAIN                GRAM POSITIVE COCCI IN CHAINS   ANTIBIOTIC                    ORG#1     AMPICILLIN   S         CLINDAMYCIN                   R         LEVOFLOXACIN                  S         LINEZOLID                     S         VANCOMYCIN                    S  Organism 1 Streptococcus agalactiae  Culture Comment IN AEROBIC BOTTLE ONLY  Culture Comment . ID TO FOLLOW  Gram Stain 1 GRAM POSITIVE COCCI IN CHAINS  Organism Quantity 1000 CFU/ML  Specimen Source IN/OUT CATH  Routine Chem:  14-Feb-16 18:24   Result Comment AEROBIC BOTTLE - NOTIFIED OF CRITICAL VALUE  - READ-BACK PROCESS PERFORMED.  - SHARON YOW 08/23/14 @ 0715.Marland KitchenMarland KitchenMTV GROUP B STREP - There is no known Penicillin Resistant  - Beta Streptococcus in the [place]  For   - patients that are Penicillin-allergic,  - Erythromycin is 85-94% susceptible, and  - Clindamycin is 80% susceptible.  Contact   - Microbiology within 7 days if   - sensitivity testing is required.  Result(s) reported on 25 Aug 2014 at 08:21AM.  Magnesium, Serum 1.9 (1.8-2.4 THERAPEUTIC RANGE: 4-7 mg/dL TOXIC: > 10 mg/dL  -----------------------)  Phosphorus, Serum 2.5 (Result(s) reported on 22 Aug 2014 at 07:03PM.)  15-Feb-16 04:57   Glucose, Serum  164  BUN  20  Sodium, Serum 141  Potassium, Serum 3.5  Chloride, Serum 98  CO2, Serum  37  Calcium (Total), Serum  8.0  Anion Gap  6  Osmolality (calc) 288  16-Feb-16 04:22   Creatinine (comp) 1.20  eGFR (African American)  57  eGFR (Non-African American)  47 (eGFR values <10mL/min/1.73 m2 may be an indication of chronic kidney  disease (CKD). Calculated eGFR, using the MRDR Study equation, is useful in  patients with stable renal function. The eGFR calculation will not be reliable in acutely ill patients when serum creatinine is changing rapidly. It is not useful in patients on dialysis. The eGFR calculation may not be applicable to patients at the low and high extremes of body sizes, pregnant women, and vegetarians.)    10:35   B-Type Natriuretic Peptide([facility])  2048 (Result(s)  reported on 24 Aug 2014 at 11:23AM.)  Cardiac:  16-Feb-16 15:05   Troponin I < 0.02 (0.00-0.05 0.05 ng/mL or less: NEGATIVE  Repeat testing in 3-6 hrs  if clinically indicated. >0.05 ng/mL: POTENTIAL  MYOCARDIAL INJURY. Repeat  testing in 3-6 hrs if  clinically indicated. NOTE: An increase or decrease  of 30% or more on serial  testing suggests a  clinically important change)  CK, Total 28 (Result(s) reported on 24 Aug 2014 at 03:54PM.)  Routine UA:  14-Feb-16 18:24   Color (UA) Yellow  Clarity (UA) Clear  Glucose (UA) Negative  Bilirubin (UA) Negative  Ketones (UA) Trace  Specific Gravity (UA) 1.020  Blood (UA) 1+  pH (UA) 7.0  Protein (UA) 100 mg/dL  Nitrite (UA) Negative  Leukocyte Esterase (UA) Trace (Result(s) reported on 22 Aug 2014 at 07:06PM.)  RBC (UA) 4 /HPF  WBC (UA) 9 /HPF  Bacteria (UA) NONE SEEN  Epithelial Cells (UA) 1 /HPF (Result(s) reported on 22 Aug 2014 at 07:06PM.)  Routine Coag:  14-Feb-16 18:24   Prothrombin 14.9 (11.4-15.0 NOTE: New Reference Range  08/06/14)  INR 1.2 (INR reference interval applies to patients on anticoagulant therapy. A single INR therapeutic range for coumarins is not optimal for all indications; however, the suggested range for most indications is 2.0 - 3.0. Exceptions to the INR Reference Range may include: Prosthetic heart valves, acute myocardial infarction, prevention of myocardial infarction, and combinations of aspirin and anticoagulant. The need for a higher or  lower target INR must be assessed individually. Reference: The Pharmacology and Management of the Vitamin K  antagonists: the seventh ACCP Conference on Antithrombotic and Thrombolytic Therapy. Chest.2004 Sept:126 (3suppl): [id number]. A HCT value >55% may artifactually increase the PT.  In one study,  the increase was an average of 25%. Reference:  "Effect on Routine and Special Coagulation Testing Values of Citrate Anticoagulant Adjustment in Patients with High HCT Values." American Journal of Clinical Pathology 2006;126:400-405.)  Routine Hem:  16-Feb-16 10:35   WBC (CBC) 8.9  RBC (CBC) 4.13  Hemoglobin (CBC)  11.4  Hematocrit (CBC) 36.4  MCV 88  MCH 27.7  MCHC  31.4  RDW  14.9  Neutrophil % 68.8  Lymphocyte % 21.2  Monocyte % 8.7  Eosinophil % 0.8  Basophil % 0.5  Neutrophil # 6.1  Lymphocyte # 1.9  Monocyte # 0.8  Eosinophil # 0.1  Basophil # 0.0 (Result(s) reported on 24 Aug 2014 at 10:54AM.)  17-Feb-16 03:40   Platelet Count (CBC)  128 (Result(s) reported on 25 Aug 2014 at 04:54AM.)   Radiology Results: CT:    15-Feb-16 01:00, CT Head Without Contrast  CT Head Without Contrast   REASON FOR EXAM:    encephalopathy  COMMENTS:       PROCEDURE: CT  - CT HEAD WITHOUT CONTRAST  - Aug 23 2014  1:00AM     CLINICAL DATA:  Weakness and altered mental status for 2 weeks.    EXAM:  CT HEAD WITHOUT CONTRAST    TECHNIQUE:  Contiguous axial images were obtained from the base of the skull  through the vertex without intravenous contrast.    COMPARISON:  None.  FINDINGS:  There is no acute intracranial abnormality including hemorrhage,  infarct, mass lesion, mass effect, midline shift orabnormal  extra-axial fluid collection. No hydrocephalus or pneumocephalus.  Short air-fluid levels are seen the maxillary sinuses bilaterally.  Scattered ethmoid air cell disease is noted.     IMPRESSION:  No acute intracranial abnormality.  Shortair-fluid levels in the maxillary  sinuses compatible with  acute sinusitis.      Electronically Signed    By: Inge Rise M.D.    On: 08/23/2014 01:08         Verified By: Ramond Dial, M.D.,    16-Feb-16 10:54, CT Head Without Contrast  CT Head Without Contrast   REASON FOR EXAM:    change in LOC  COMMENTS:       PROCEDURE: CT  - CT HEAD WITHOUT CONTRAST  - Aug 24 2014 10:54AM     CLINICAL DATA:  74 year old female with altered level of  consciousness.    EXAM:  CT HEAD WITHOUT CONTRAST    TECHNIQUE:  Contiguous axial images were obtained from the base of the skull  through the vertex without intravenous contrast.  COMPARISON:  Head CT 08/23/2014.    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 Well-defined low-attenuation in the right  basal ganglia (image 13 of series 2) is also unchanged, compatible  with old lacunar infarction. Faint physiologic calcifications in the  basal ganglia bilaterally. No acute intracranial abnormalities.  Specifically, no evidence of acute intracranial hemorrhage, no  definite findings of acute/subacute cerebral ischemia, no mass  effect, and no hydrocephalus or abnormal intra or extra-axial fluid  collections. No acute displaced skull fractures are identified.  Small air-fluid levels are again noted in the maxillary sinuses  bilaterally, and multiple ethmoid sinuses are opacified.     IMPRESSION:  1. No acute intracranial abnormalities.  2. 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  3. 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  4. Paranasal sinus disease with evidence of acute maxillary  sinusitis bilaterally redemonstrated.      Electronically Signed    By: Vinnie Langton M.D.    On: 08/24/2014 11:05         Verified By: Etheleen Mayhew, M.D.,   Impression/Recommendations: Recommendations:   [name] is a 74 yo woman with a history of atrial fibrillation, HTN, and prior TIA who presented with weakness, confusion, and shortness of breath and was found to have sepsis likely due to cellulitis, as well as a UTI. Her neurologic exam shows mild executive dysfunction but otherwise no focal or lateralizing deficits. Brain CT has been negative for acute intracranial process x 2 during this hospital stay. Her history of fluctuating arousal in the setting of systemic infection is most consistent with delirium. continue excellent management of infectious issueswould defer MRI at this time given reassuring exam and history very consistent with deliriummaintain day/night cuing (quiet and dark during night, open shades during day)minimize medications when possible and avoid benzodiazepines. If agitation becomes a problem, would recommend a short course of an antipsychotic such as risperidone you for the opportunity to participate in [name]' care. Please page Neurology with further questions. Mar Daring, MD   Electronic Signatures: Carmin Richmond (MD)  (Signed 17-Feb-16 15:15)  Authored: REFERRING PHYSICIAN, Consult, History of Present Illness, Review of Systems, PAST MEDICAL/SURGICAL HISTORY, HOME MEDICATIONS, ALLERGIES, Social/Family History,  NURSING VITAL SIGNS, Physical Exam-, LAB RESULTS, RADIOLOGY RESULTS, Recommendations   Last Updated: 17-Feb-16 15:15 by Carmin Richmond (MD)

## 2014-11-07 NOTE — H&P (Signed)
PATIENT NAME:  Marie MaplesSIMMONS, Alesandra L MR#:  [id number] DATE OF BIRTH:  1940-12-21  DATE OF ADMISSION:  08/22/2014  REFERRING PHYSICIAN:  Kathreen DevoidKevin A. Paduchowski, MD  PRIMARY CARE PHYSICIAN:  Duanne Limerickeanna C. Jones, MD   CHIEF COMPLAINT:  Shortness of breath and weakness.   HISTORY OF PRESENT ILLNESS:  The patient is unable to provide any meaningful information given mental status and medical condition. Apparently, she has been having gradual progressive weakness for the last 2 weeks' duration, particularly worse today. She also describes shortness of breath and noted to have worsening lower extremity edema for the same duration. Upon arrival to the Emergency Department, she was noted to be hypoxic, requiring supplemental O2 to keep SaO2 greater than 92%. Temperature at home:  She was febrile, documented at 104 degrees Fahrenheit; however, here, she is afebrile. Regardless, on arrival, she was noted to be in atrial fibrillation with rapid ventricular response and heart rate into the 130s and 140s. She received IV fluid hydration and heart rate has improved.   REVIEW OF SYSTEMS:  Unobtainable given the patient's mental status and medical condition.   PAST MEDICAL HISTORY:  Includes atrial fibrillation and essential hypertension as well as remote history of DVT.   SOCIAL HISTORY:  Unable to obtain given the patient's mental status and medical condition.   FAMILY HISTORY:  Unable to obtain given the patient's mental status and medical condition.   ALLERGIES:  No known drug allergies.   HOME MEDICATIONS:  Include aspirin 81 mg p.o. daily, Coreg 25 mg p.o. b.i.d., Lasix 80 mg p.o. daily.   PHYSICAL EXAMINATION: VITAL SIGNS:  Temperature 98.3, heart rate on arrival 133, currently 91, respirations 36, blood pressure 151/97, saturating 95% on 5 liters nasal cannula. Weight 103.2 kg, BMI 36.7.  GENERAL:  Ill-appearing Caucasian female currently in minimal-to-moderate distress given mental status and respiratory  status.  HEAD:  Normocephalic, atraumatic.  EYES:  Pupils are equal, round, and reactive to light. Extraocular muscles:  Unable to fully assess given the patient's mental status. No scleral icterus.  MOUTH:  Moist mucosal membranes. Dentition is intact. No abscess noted.  EARS, NOSE, AND THROAT:  Clear without exudates. No external lesions.  NECK:  Supple. No thyromegaly. No nodules. No JVD.  PULMONARY:  Decreased breath sounds secondary to poor respiratory effort; however, no frank wheezes, rales, or rhonchi. No use of accessory muscles. Poor respiratory effort.  CHEST:  Nontender to palpation.  CARDIOVASCULAR:  S1 and S2. Irregular rate, irregular rhythm. No murmurs, rubs, or gallops. She has 3+ edema to the thighs bilaterally with the right slightly worse than the left. Pedal pulses 2+ bilaterally.  GASTROINTESTINAL:  Soft, nontender, nondistended. No masses. Positive bowel sounds. No hepatosplenomegaly.  MUSCULOSKELETAL:  Passive range of motion full in all extremities. No swelling, clubbing, or edema other than stated above.  NEUROLOGIC:  Unable to fully assess given the patient's mental status and medical condition. She is currently lethargic and will respond briefly to painful stimuli; however, falls promptly back to sleep. Unable to provide any meaningful information or follow commands.  SKIN:  Grossly erythematous right lower extremity essentially from the knee and distally, which is warm to touch. No actual lesions noted.  PSYCHIATRIC:  Unable to fully assess given the patient's mental status and medical condition. She is once again lethargic. She will awake briefly to painful stimuli, but falls promptly back to sleep.   LABORATORY DATA:  Chest x-ray performed, which reveals cardiomegaly, however, no acute cardiopulmonary findings. Ultrasound of  the right lower extremity reveals no DVT. However, there is extensive soft tissue edema. Remainder of laboratory data:  Sodium 140, potassium 3.4,  chloride 98, bicarbonate 35, BUN 17, creatinine 0.86, glucose 132. LFTs:  Alkaline phosphatase 128, otherwise within normal limits. Troponin is less than 0.02. WBC is 16.1, hemoglobin 13.9, platelets 172,000. Urinalysis:  WBCs 9, RBCs 4, leukocyte esterase trace, nitrite negative. Lactic acid is 1.5.   ASSESSMENT AND PLAN:  This is a 74 year old Caucasian female with a history of hypertension and atrial fibrillation, presenting with weakness and shortness of breath.   1.  Sepsis, meeting septic criteria by heart rate, respiratory rate, and leukocytosis, likely secondary to right lower extremity cellulitis. She has received 30 mL per kg intravenous fluid bolus in the Emergency Department, however, does have evidence of gross volume overload with bilateral lower extremity edema. She has no pulmonary compromise. We will hold on further intravenous fluid hydration, however, keep mean arterial pressure greater than 65. Blood cultures have been obtained. Antibiotic coverage with vancomycin.  2.  Atrial fibrillation with rapid ventricular response. Rate is improved after intravenous fluid hydration. Goal heart rate is less than 120. Provide intravenous Cardizem if required. We will also get a transthoracic echocardiogram.  3.  Encephalopathy. Avoid further sedating agents. Neurological checks q. 4 hours. We will get a CT of the head now to see if there are any further etiologies.  4.  Hypertension, essential. Continue with Coreg.  5.  Venous thromboembolism prophylaxis with heparin subcutaneously.   CODE STATUS:  The patient is a full code.   TIME SPENT:  45 minutes.    ____________________________ Cletis Athens. Lorette Peterkin, MD dkh:nb D: 08/23/2014 00:07:42 ET T: 08/23/2014 03:00:23 ET JOB#: [id number]  cc: Cletis Athens. Aspasia Rude, MD, <Dictator> Deandrew Hoecker Synetta Shadow MD ELECTRONICALLY SIGNED 08/23/2014 20:46

## 2014-11-07 NOTE — Consult Note (Signed)
PATIENT NAME:  [name], [name] MR#:  [id number] DATE OF BIRTH:  January 11, 1941  DATE OF CONSULTATION:  08/25/2014  REFERRING PHYSICIAN:  Ramonita LabAruna Gouru, MD  CONSULTING PHYSICIAN:  Stann Mainlandavid P. Sampson GoonFitzgerald, MD  REASON FOR CONSULTATION: Sepsis and bacteremia and cellulitis.   HISTORY OF PRESENT ILLNESS: This is a pleasant 74 year old female with a history of atrial fibrillation, hypertension, and prior DVT. She was admitted February 14 with shortness of breath, weakness, leg swelling, and fever to 104. She was found to be in atrial fibrillation with rapid ventricular response. The patient was also found to have a cellulitis. Since then, she has been treated with IV antibiotics. She has had positive blood cultures. She has been somewhat delirious and has been seen by neurology. Atrial fibrillation has been controlled with Cardizem .   PAST MEDICAL HISTORY: 1. Hypertension.  2. Varicose veins.  3. Prior blood clots.   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 She has also had eye surgery, wrist surgery, and a hysterectomy.   ALLERGIES: No known drug allergies.   SOCIAL HISTORY: Does not smoke, drink, or use drugs. Family is at the bedside.   FAMILY HISTORY: Noncontributory.   ANTIBIOTICS SINCE ADMISSION: Include levofloxacin given February 15 and 16, vancomycin begun February 14, and Zosyn given February 13.  REVIEW OF SYSTEMS: Unable to be obtained, as the patient is somewhat lethargic.   PHYSICAL EXAMINATION:  VITAL SIGNS: Temperature 99.7, pulse 101, blood pressure 147/88, respirations 22, saturation 95% on 2 liters. She has been afebrile since admission, but did have a fever on admission.  GENERAL: She is sleepy, somewhat lethargic.  HEENT: Pupils are reactive.  OROPHARYNX: Clear.  NECK: Supple.  HEART: Regular tachycardic.  LUNGS: Clear.  ABDOMEN: Soft, obese, nontender, nondistended. No hepatosplenomegaly.  EXTREMITIES: Bilateral 1+ edema. On her right, she has  fading erythema. She has no open sores.  NEUROLOGIC: She is confused, somewhat lethargic.   LABORATORY DATA: White blood count on admission was 16.1 on February 14, now is 8.9, hemoglobin 11.4, platelets 122,000. INR was normal. Blood cultures 2 of 2 are growing group B streptococcus sensitive to ampicillin, vancomycin, levofloxacin, and linezolid, but resistant to clindamycin. Urine culture has 1000 colony-forming units of a gram-negative rod. Urinalysis had only 9 white cells. Echocardiogram showed EF 55% to 60%, mild left ventricular hypertrophy, severe increased left ventricular posterior wall thickness, mild mitral and mild tricuspid regurgitation. BNP was elevated at 2000. LFTs were normal. Troponins were negative.   IMPRESSION: A 74 year old female with a history of bilateral lower extremity lymphedema, admitted with cellulitis in her right lower extremity, presented with sepsis, atrial fibrillation, temperature of 104, and a white count of 16. She is currently improving, but remains delirious. White count has normalized and she is afebrile. She has group B Streptococcus bacteremia.   RECOMMENDATIONS: 1. Transition to ceftriaxone 2 grams q. 24.  2. Discontinue vancomycin and levofloxacin. There is no evidence of a urinary tract infection.  3. Elevate the leg with 4-5 pillows to help decrease the edema.  4. She will likely need another day or so of IV antibiotics, then can be transitioned to oral Keflex 500 mg t.i.d.  Would suggest a total of 10 days of antibiotics from admission.  5. I can see in clinic in 10-14 days to discuss prevention of recurrent cellulitis, as she is at high risk given her lymphedema.  6. Thank you for the consult. I will be glad to follow with you  ____________________________ Stann Mainland. Sampson Goon, MD dpf:mw D: 08/25/2014 15:52:14 ET T: 08/25/2014 16:09:35 ET JOB#: [id number]  cc: Stann Mainland. Sampson Goon, MD, <Dictator> Trevionne Advani Sampson Goon MD ELECTRONICALLY SIGNED  08/31/2014 21:27

## 2014-12-18 ENCOUNTER — Inpatient Hospital Stay
Admission: EM | Admit: 2014-12-18 | Discharge: 2014-12-21 | DRG: 872 | Disposition: A | Discharge status: Home / Self Care | Payer: Medicare Other | Attending: Internal Medicine | Admitting: Internal Medicine

## 2014-12-18 ENCOUNTER — Encounter: Payer: Self-pay | Admitting: Emergency Medicine

## 2014-12-18 ENCOUNTER — Emergency Department: Payer: Medicare Other

## 2014-12-18 DIAGNOSIS — Z96642 Presence of left artificial hip joint: Secondary | ICD-10-CM | POA: Diagnosis present

## 2014-12-18 DIAGNOSIS — A419 Sepsis, unspecified organism: Principal | ICD-10-CM | POA: Diagnosis present

## 2014-12-18 DIAGNOSIS — E876 Hypokalemia: Secondary | ICD-10-CM | POA: Diagnosis present

## 2014-12-18 DIAGNOSIS — Z87891 Personal history of nicotine dependence: Secondary | ICD-10-CM | POA: Diagnosis not present

## 2014-12-18 DIAGNOSIS — Z7982 Long term (current) use of aspirin: Secondary | ICD-10-CM | POA: Diagnosis not present

## 2014-12-18 DIAGNOSIS — Z823 Family history of stroke: Secondary | ICD-10-CM | POA: Diagnosis not present

## 2014-12-18 DIAGNOSIS — I10 Essential (primary) hypertension: Secondary | ICD-10-CM | POA: Diagnosis present

## 2014-12-18 DIAGNOSIS — I482 Chronic atrial fibrillation, unspecified: Secondary | ICD-10-CM | POA: Insufficient documentation

## 2014-12-18 DIAGNOSIS — I890 Lymphedema, not elsewhere classified: Secondary | ICD-10-CM | POA: Diagnosis present

## 2014-12-18 DIAGNOSIS — R600 Localized edema: Secondary | ICD-10-CM | POA: Diagnosis present

## 2014-12-18 DIAGNOSIS — I272 Other secondary pulmonary hypertension: Secondary | ICD-10-CM | POA: Diagnosis present

## 2014-12-18 DIAGNOSIS — D696 Thrombocytopenia, unspecified: Secondary | ICD-10-CM | POA: Diagnosis present

## 2014-12-18 DIAGNOSIS — I38 Endocarditis, valve unspecified: Secondary | ICD-10-CM | POA: Diagnosis present

## 2014-12-18 DIAGNOSIS — E785 Hyperlipidemia, unspecified: Secondary | ICD-10-CM | POA: Diagnosis present

## 2014-12-18 DIAGNOSIS — N3001 Acute cystitis with hematuria: Secondary | ICD-10-CM | POA: Diagnosis present

## 2014-12-18 DIAGNOSIS — Z79899 Other long term (current) drug therapy: Secondary | ICD-10-CM

## 2014-12-18 DIAGNOSIS — L0390 Cellulitis, unspecified: Secondary | ICD-10-CM | POA: Diagnosis present

## 2014-12-18 DIAGNOSIS — Z8249 Family history of ischemic heart disease and other diseases of the circulatory system: Secondary | ICD-10-CM

## 2014-12-18 DIAGNOSIS — I4891 Unspecified atrial fibrillation: Secondary | ICD-10-CM

## 2014-12-18 DIAGNOSIS — L03115 Cellulitis of right lower limb: Secondary | ICD-10-CM | POA: Diagnosis present

## 2014-12-18 HISTORY — DX: Localized edema: R60.0

## 2014-12-18 HISTORY — DX: Cellulitis of right lower limb: L03.115

## 2014-12-18 HISTORY — DX: Unspecified atrial fibrillation: I48.91

## 2014-12-18 HISTORY — DX: Essential (primary) hypertension: I10

## 2014-12-18 HISTORY — DX: Hyperlipidemia, unspecified: E78.5

## 2014-12-18 HISTORY — DX: Endocarditis, valve unspecified: I38

## 2014-12-18 HISTORY — DX: Pulmonary hypertension, unspecified: I27.20

## 2014-12-18 LAB — CBC WITH DIFFERENTIAL/PLATELET
BASOS PCT: 1 %
Basophils Absolute: 0.1 10*3/uL (ref 0–0.1)
EOS ABS: 0.1 10*3/uL (ref 0–0.7)
EOS PCT: 1 %
HCT: 44 % (ref 35.0–47.0)
Hemoglobin: 14.2 g/dL (ref 12.0–16.0)
Lymphocytes Relative: 14 %
Lymphs Abs: 2 10*3/uL (ref 1.0–3.6)
MCH: 27.9 pg (ref 26.0–34.0)
MCHC: 32.3 g/dL (ref 32.0–36.0)
MCV: 86.4 fL (ref 80.0–100.0)
MONO ABS: 1.4 10*3/uL — AB (ref 0.2–0.9)
MONOS PCT: 9 %
NEUTROS PCT: 75 %
Neutro Abs: 11.2 10*3/uL — ABNORMAL HIGH (ref 1.4–6.5)
PLATELETS: 144 10*3/uL — AB (ref 150–440)
RBC: 5.09 MIL/uL (ref 3.80–5.20)
RDW: 16.5 % — ABNORMAL HIGH (ref 11.5–14.5)
WBC: 14.7 10*3/uL — ABNORMAL HIGH (ref 3.6–11.0)

## 2014-12-18 LAB — URINALYSIS COMPLETE WITH MICROSCOPIC (ARMC ONLY)
Bilirubin Urine: NEGATIVE
Glucose, UA: NEGATIVE mg/dL
KETONES UR: NEGATIVE mg/dL
NITRITE: POSITIVE — AB
PROTEIN: 30 mg/dL — AB
SPECIFIC GRAVITY, URINE: 1.023 (ref 1.005–1.030)
pH: 5 (ref 5.0–8.0)

## 2014-12-18 LAB — LACTIC ACID, PLASMA: Lactic Acid, Venous: 0.8 mmol/L (ref 0.5–2.0)

## 2014-12-18 LAB — BASIC METABOLIC PANEL
ANION GAP: 11 (ref 5–15)
BUN: 19 mg/dL (ref 6–20)
CALCIUM: 9.3 mg/dL (ref 8.9–10.3)
CO2: 32 mmol/L (ref 22–32)
CREATININE: 0.9 mg/dL (ref 0.44–1.00)
Chloride: 95 mmol/L — ABNORMAL LOW (ref 101–111)
GLUCOSE: 118 mg/dL — AB (ref 65–99)
Potassium: 3.2 mmol/L — ABNORMAL LOW (ref 3.5–5.1)
SODIUM: 138 mmol/L (ref 135–145)

## 2014-12-18 LAB — BRAIN NATRIURETIC PEPTIDE: B NATRIURETIC PEPTIDE 5: 136 pg/mL — AB (ref 0.0–100.0)

## 2014-12-18 MED ORDER — CEFTRIAXONE SODIUM IN DEXTROSE 20 MG/ML IV SOLN
1.0000 g | INTRAVENOUS | Status: DC
  Administered 2014-12-18: 1 g via INTRAVENOUS
  Filled 2014-12-18 (×2): qty 50

## 2014-12-18 MED ORDER — SODIUM CHLORIDE 0.9 % IJ SOLN
3.0000 mL | Freq: Two times a day (BID) | INTRAMUSCULAR | Status: DC
  Administered 2014-12-18 – 2014-12-20 (×5): 3 mL via INTRAVENOUS

## 2014-12-18 MED ORDER — SODIUM CHLORIDE 0.9 % IV SOLN
INTRAVENOUS | Status: DC
  Administered 2014-12-18: 23:00:00 via INTRAVENOUS

## 2014-12-18 MED ORDER — VANCOMYCIN HCL 10 G IV SOLR: 1000.0000 mg | INTRAVENOUS | Status: DC

## 2014-12-18 MED ORDER — ACETAMINOPHEN 650 MG RE SUPP: 650.0000 mg | Freq: Four times a day (QID) | RECTAL | Status: DC | PRN

## 2014-12-18 MED ORDER — ACETAMINOPHEN 325 MG PO TABS: 650.0000 mg | ORAL_TABLET | Freq: Four times a day (QID) | ORAL | Status: DC | PRN

## 2014-12-18 MED ORDER — VANCOMYCIN HCL IN DEXTROSE 1-5 GM/200ML-% IV SOLN
1000.0000 mg | INTRAVENOUS | Status: AC
  Administered 2014-12-18: 1000 mg via INTRAVENOUS
  Filled 2014-12-18: qty 200

## 2014-12-18 MED ORDER — SENNOSIDES-DOCUSATE SODIUM 8.6-50 MG PO TABS: 1.0000 | ORAL_TABLET | Freq: Every evening | ORAL | Status: DC | PRN

## 2014-12-18 MED ORDER — IPRATROPIUM-ALBUTEROL 0.5-2.5 (3) MG/3ML IN SOLN
RESPIRATORY_TRACT | Status: AC
  Administered 2014-12-18: 3 mL via RESPIRATORY_TRACT
  Filled 2014-12-18: qty 3

## 2014-12-18 MED ORDER — CARVEDILOL 25 MG PO TABS
12.5000 mg | ORAL_TABLET | ORAL | Status: AC
  Administered 2014-12-18: 12.5 mg via ORAL

## 2014-12-18 MED ORDER — ENOXAPARIN SODIUM 40 MG/0.4ML ~~LOC~~ SOLN
40.0000 mg | SUBCUTANEOUS | Status: DC
  Administered 2014-12-18 – 2014-12-20 (×3): 40 mg via SUBCUTANEOUS
  Filled 2014-12-18 (×3): qty 0.4

## 2014-12-18 MED ORDER — IPRATROPIUM-ALBUTEROL 0.5-2.5 (3) MG/3ML IN SOLN
3.0000 mL | Freq: Once | RESPIRATORY_TRACT | Status: AC
  Administered 2014-12-18: 3 mL via RESPIRATORY_TRACT

## 2014-12-18 MED ORDER — CARVEDILOL 25 MG PO TABS
ORAL_TABLET | ORAL | Status: AC
  Administered 2014-12-18: 12.5 mg via ORAL
  Filled 2014-12-18: qty 1

## 2014-12-18 MED ORDER — CARVEDILOL 25 MG PO TABS
25.0000 mg | ORAL_TABLET | Freq: Two times a day (BID) | ORAL | Status: DC
  Administered 2014-12-19 – 2014-12-21 (×5): 25 mg via ORAL
  Filled 2014-12-18 (×5): qty 1

## 2014-12-18 MED ORDER — ASPIRIN EC 81 MG PO TBEC
81.0000 mg | DELAYED_RELEASE_TABLET | Freq: Every day | ORAL | Status: DC
  Administered 2014-12-19 – 2014-12-21 (×3): 81 mg via ORAL
  Filled 2014-12-18 (×3): qty 1

## 2014-12-18 NOTE — ED Provider Notes (Signed)
[facility] Emergency Department Provider Note  ____________________________________________  Time seen: Approximately 8:21 PM  I have reviewed the triage vital signs and the nursing notes.   HISTORY  Chief Complaint Leg Swelling and Shortness of Breath    HPI [name] is a 74 y.o. female who presents with a red swollen leg. She states she's had a similar back in February and was quite ill with similar symptoms. She denies pain. She does have mild feeling of shortness of breath, but states that is an ongoing issue for her. She denies chest pain.  Swelling, location right leg, redness, believes it cellulitis, no surgery but has been admitted for the same with "sepsis" per family.   Past Medical History  Diagnosis Date  . Hypertension   . Atrial fibrillation   . HLD (hyperlipidemia)   . Valvular heart disease   . Pulmonary HTN   . Lower extremity edema     Patient Active Problem List   Diagnosis Date Noted  . Cellulitis of leg, right 12/18/2014  . HTN (hypertension) 12/18/2014  . A-fib 12/18/2014  . Bilateral lower extremity edema 12/18/2014  . Atrial fibrillation with RVR 12/18/2014  . HLD (hyperlipidemia) 12/18/2014  . Sepsis 12/18/2014    Past Surgical History  Procedure Laterality Date  . Abdominal hysterectomy    . Bilateral wrist surgery    . Left hip replacement      Current Outpatient Rx  Name  Route  Sig  Dispense  Refill  . aspirin EC 81 MG tablet   Oral   Take 81 mg by mouth daily.         . carvedilol (COREG) 25 MG tablet   Oral   Take 25 mg by mouth 2 (two) times daily with a meal.         . Cyanocobalamin (VITAMIN B-12 PO)   Oral   Take 1 tablet by mouth daily.         . furosemide (LASIX) 40 MG tablet   Oral   Take 40 mg by mouth daily.           Allergies Review of patient's allergies indicates no known allergies.  Family History  Problem Relation Age of Onset  . Heart attack Father   .  Stroke Mother     Social History History  Substance Use Topics  . Smoking status: Former Smoker    Quit date: 04/01/1984  . Smokeless tobacco: Never Used  . Alcohol Use: No    Review of Systems Constitutional: Chills, no fever Eyes: No visual changes. ENT: No sore throat. Cardiovascular: Denies chest pain. Respiratory: Mild shortness of breath Gastrointestinal: No abdominal pain.  No nausea, no vomiting.  No diarrhea.  No constipation. Genitourinary: Negative for dysuria. Musculoskeletal: Negative for back pain. Skin: Or swelling in both legs, this is chronic but the redness on the right is new. Neurological: Negative for headaches, focal weakness or numbness.  10-point ROS otherwise negative.  ____________________________________________   PHYSICAL EXAM:  VITAL SIGNS: ED Triage Vitals  Enc Vitals Group     BP 12/18/14 1707 125/74 mmHg     Pulse Rate 12/18/14 1707 102     Resp 12/18/14 1818 20     Temp 12/18/14 1707 98.1 F (36.7 C)     Temp Source 12/18/14 1707 Oral     SpO2 12/18/14 1707 88 %     Weight 12/18/14 1707 209 lb (94.802 kg)     Height 12/18/14 1707 5'  7" (1.702 m)     Head Cir --      Peak Flow --      Pain Score 12/18/14 1953 0     Pain Loc --      Pain Edu? --      Excl. in GC? --     Constitutional: Alert and oriented. Fatigued appearing, no distress.. Eyes: Conjunctivae are normal. PERRL. EOMI. Head: Atraumatic. Nose: No congestion/rhinnorhea. Mouth/Throat: Mucous membranes are moist.  Oropharynx non-erythematous. Neck: No stridor.   Cardiovascular: Irregular rate and irregular rhythm Grossly normal heart sounds.  Good peripheral circulation. Respiratory: Mild increased work of breathing, lungs are clear though difficult to fully auscultate due to patient's size Gastrointestinal: Soft and nontender. No distention. No abdominal bruits. No CVA tenderness. Musculoskeletal: Bilateral 3+ lower extremity pitting edema. There is erythema from the  right mid leg down which is circumferential, tender and blanching.  Neurologic:  Normal speech and language. No gross focal neurologic deficits are appreciated. Speech is normal Skin:  Skin is warm, dry and intact.  Psychiatric: Mood and affect are normal. Speech and behavior are normal.  ____________________________________________   LABS (all labs ordered are listed, but only abnormal results are displayed)  Labs Reviewed  BASIC METABOLIC PANEL - Abnormal; Notable for the following:    Potassium 3.2 (*)    Chloride 95 (*)    Glucose, Bld 118 (*)    All other components within normal limits  CBC WITH DIFFERENTIAL/PLATELET - Abnormal; Notable for the following:    WBC 14.7 (*)    RDW 16.5 (*)    Platelets 144 (*)    Neutro Abs 11.2 (*)    Monocytes Absolute 1.4 (*)    All other components within normal limits  BRAIN NATRIURETIC PEPTIDE - Abnormal; Notable for the following:    B Natriuretic Peptide 136.0 (*)    All other components within normal limits  URINALYSIS COMPLETEWITH MICROSCOPIC ([facility] ONLY) - Abnormal; Notable for the following:    Color, Urine YELLOW (*)    APPearance HAZY (*)    Hgb urine dipstick 2+ (*)    Protein, ur 30 (*)    Nitrite POSITIVE (*)    Leukocytes, UA 2+ (*)    Bacteria, UA MANY (*)    Squamous Epithelial / LPF 0-5 (*)    All other components within normal limits  CULTURE, BLOOD (ROUTINE X 2)  CULTURE, BLOOD (ROUTINE X 2)  LACTIC ACID, PLASMA   ____________________________________________  EKG  ED ECG REPORT I, QUALE, MARK, the attending physician, personally viewed and interpreted this ECG.   Date: 12/18/2014  EKG Time: 1835  Rate: 114  Rhythm: Atrial fibrillation  ST&T Change: Nonspecific T-wave abnormality  No obvious acute ischemic change.  ____________________________________________  RADIOLOGY   COMPARISON: 08/24/2014 and prior radiographs dating back to 02/26/2009  FINDINGS: Cardiomegaly again noted.  There is no  evidence of focal airspace disease, pulmonary edema, suspicious pulmonary nodule/mass, pleural effusion, or pneumothorax. No acute bony abnormalities are identified.  IMPRESSION: Cardiomegaly without evidence of active cardiopulmonary disease. ______________________________________   PROCEDURES  Procedure(s) performed: None  Critical Care performed: No  ____________________________________________   INITIAL IMPRESSION / ASSESSMENT AND PLAN / ED COURSE  Pertinent labs & imaging results that were available during my care of the patient were reviewed by me and considered in my medical decision making (see chart for details).  Patient presents with a swollen right leg, mild tachypnea but clear lungs. She is a long-time smoker and her oxygen  level is noted to be slightly low and we will attempt to give her some do not. Her chest x-ray does not evidence CHF. Patient does have significant pedal edema as well though.  Primary concern is cellulitis of the right leg. She does have an elevated white count, she is tachycardic, and the patient is slightly tachypneic. I believe she has cellulitis of the right leg with early evidence of sepsis. ____________________________________________   FINAL CLINICAL IMPRESSION(S) / ED DIAGNOSES  Final diagnoses:  Cellulitis of right leg  Sepsis affecting skin      Sharyn Creamer, MD 12/18/14 2147

## 2014-12-18 NOTE — ED Notes (Signed)
Attempted to call report.  Secretary states receiving nurse needs to pass one more med and will call back in minutes.

## 2014-12-18 NOTE — ED Notes (Signed)
Patient arrives to the ED with right leg swelling and redness worsening since yesterday. Hx of cellulitis requiring hospitalization. Patient also noted to have a SPO2 of 88% on room air while speaking and noticeable ShOB worsening today

## 2014-12-18 NOTE — H&P (Signed)
[facility] - [place] at [facility]   PATIENT NAME: [name]    MR#:  [id number]  DATE OF BIRTH:  04-24-1941  DATE OF ADMISSION:  12/18/2014  PRIMARY CARE PHYSICIAN: Elizabeth Sauer, MD   REQUESTING/REFERRING PHYSICIAN: Quale  CHIEF COMPLAINT:   Chief Complaint  Patient presents with  . Leg Swelling  . Shortness of Breath    HISTORY OF PRESENT ILLNESS:  [name]  is a 74 y.o. female who presents with right lower extremity cellulitis. Patient states that she recently had a similar episode of cellulitis and was in the hospital for several days, this occurred a couple months ago. Patient states that she first noticed the erythema of her right lower extremity about a day and a half ago. She really didn't notice any change in swelling as she has chronic bilateral lower extremity edema at baseline. Her right lower shunt is not very tender. She came to the ED because of the erythema, in combination with about a day of some mild diarrhea, some malaise and generalized weakness. She denies overt chills or fever, but states that she has been feeling cold. In the ED she was found to be in A. fib with RVR, and had a white count of 14.7. Hospitalists were called for admission for sepsis due to cellulitis.  PAST MEDICAL HISTORY:   Past Medical History  Diagnosis Date  . Hypertension   . Atrial fibrillation   . HLD (hyperlipidemia)   . Valvular heart disease   . Pulmonary HTN   . Lower extremity edema     PAST SURGICAL HISTORY:   Past Surgical History  Procedure Laterality Date  . Abdominal hysterectomy    . Bilateral wrist surgery    . Left hip replacement      SOCIAL HISTORY:   History  Substance Use Topics  . Smoking status: Former Smoker    Quit date: 04/01/1984  . Smokeless tobacco: Never Used  . Alcohol Use: No    FAMILY HISTORY:   Family History  Problem Relation Age of Onset  . Heart attack Father   . Stroke Mother     DRUG  ALLERGIES:  No Known Allergies  MEDICATIONS AT HOME:   Prior to Admission medications   Medication Sig Start Date End Date Taking? Authorizing Provider  aspirin EC 81 MG tablet Take 81 mg by mouth daily.   Yes Historical Provider, MD  carvedilol (COREG) 25 MG tablet Take 25 mg by mouth 2 (two) times daily with a meal.   Yes Historical Provider, MD  furosemide (LASIX) 40 MG tablet Take 40 mg by mouth daily.   Yes Historical Provider, MD    REVIEW OF SYSTEMS:  Review of Systems  Constitutional: Positive for malaise/fatigue. Negative for fever, chills and weight loss.  HENT: Negative for ear pain, hearing loss and tinnitus.   Eyes: Negative for blurred vision, double vision, pain and redness.  Respiratory: Positive for shortness of breath (mild) and wheezing (mild). Negative for cough and hemoptysis.   Cardiovascular: Negative for chest pain, palpitations, orthopnea and leg swelling.  Gastrointestinal: Positive for diarrhea (1 day). Negative for nausea, vomiting, abdominal pain and constipation.  Genitourinary: Negative for dysuria, frequency and hematuria.  Musculoskeletal: Negative for back pain, joint pain and neck pain.  Skin: Positive for rash (right lower extremity erythema).       No acne or lesions  Neurological: Positive for weakness ( mild, for 1 day). Negative for dizziness, tremors and focal weakness.  Endo/Heme/Allergies: Negative for polydipsia. Does not bruise/bleed easily.  Psychiatric/Behavioral: Negative for depression. The patient is not nervous/anxious and does not have insomnia.      VITAL SIGNS:   Filed Vitals:   12/18/14 1818 12/18/14 1951 12/18/14 1953 12/18/14 2040  BP: 123/85 112/94 126/81 101/72  Pulse: 115 106 104 90  Temp:      TempSrc:      Resp: Height:      Weight:      SpO2: 93%  96% 95%   Wt Readings from Last 3 Encounters:  12/18/14 94.802 kg (209 lb)    PHYSICAL EXAMINATION:  Physical Exam  Constitutional: She is oriented to  person, place, and time. She appears well-developed and well-nourished. No distress.  HENT:  Head: Normocephalic and atraumatic.  Mouth/Throat: Oropharynx is clear and moist.  Eyes: Conjunctivae and EOM are normal. Pupils are equal, round, and reactive to light. No scleral icterus.  Neck: Normal range of motion. Neck supple. No JVD present. No thyromegaly present.  Cardiovascular: Normal rate and intact distal pulses.  Exam reveals no gallop and no friction rub.   No murmur heard. Irregular rhythm  Respiratory: Effort normal. No respiratory distress. She has wheezes (mild expiratory). She has no rales.  GI: Soft. Bowel sounds are normal. She exhibits no distension. There is no tenderness.  Musculoskeletal: Normal range of motion. She exhibits edema (chronic bilateral lower extremity).  No arthritis, no gout  Lymphadenopathy:    She has no cervical adenopathy.  Neurological: She is alert and oriented to person, place, and time. No cranial nerve deficit.  No dysarthria, no aphasia  Skin: Skin is warm and dry. No rash noted. There is erythema (right lower extremity from mid shin down to the ankle).  Psychiatric: She has a normal mood and affect. Her behavior is normal. Judgment and thought content normal.    LABORATORY PANEL:   CBC  Recent Labs Lab 12/18/14 1934  WBC 14.7*  HGB 14.2  HCT 44.0  PLT 144*   ------------------------------------------------------------------------------------------------------------------  Chemistries   Recent Labs Lab 12/18/14 1934  NA 138  K 3.2*  CL 95*  CO2 32  GLUCOSE 118*  BUN 19  CREATININE 0.90  CALCIUM 9.3   ------------------------------------------------------------------------------------------------------------------  Cardiac Enzymes No results for input(s): TROPONINI in the last 168 hours. ------------------------------------------------------------------------------------------------------------------  RADIOLOGY:  Dg  Chest Port 1 View  12/18/2014   CLINICAL DATA:  Increasing shortness of breath today.  EXAM: PORTABLE CHEST - 1 VIEW  COMPARISON:  08/24/2014 and prior radiographs dating back to 02/26/2009  FINDINGS: Cardiomegaly again noted.  There is no evidence of focal airspace disease, pulmonary edema, suspicious pulmonary nodule/mass, pleural effusion, or pneumothorax. No acute bony abnormalities are identified.  IMPRESSION: Cardiomegaly without evidence of active cardiopulmonary disease.   Electronically Signed   By: Harmon Pier M.D.   On: 12/18/2014 19:24    EKG:   Orders placed or performed in visit on 08/22/14  . EKG 12-Lead  . EKG 12-Lead    IMPRESSION AND PLAN:  Principal Problem:   Sepsis - due to cellulitis of the right leg. Check lactic acid. She is hemodynamically stable. She received a dose of vancomycin in the ED. Given that she has moderate nonpurulent cellulitis, we will continue with IV Rocephin per protocol. Treatment of other problems as below. Blood cultures were sent from the ED. We'll check UA as well. Active Problems:   Cellulitis of leg, right - antibiotics as above. Follow white  count for downtrend.   Atrial fibrillation with RVR - improved after she received her home dose beta blocker, continue home dose beta blocker.   HTN (hypertension) - trending on low end normal. We'll continue her beta blocker as above for her A. fib, we'll hold her Lasix for now until her blood pressure improves.   Bilateral lower extremity edema - chronic problem, on Lasix at home for the same. We'll hold this for now as above.   HLD (hyperlipidemia) - not on medication, heart healthy diet while here.  All the records are reviewed and case discussed with ED provider. Management plans discussed with the patient and/or family.  DVT PROPHYLAXIS: SubQ lovenox  ADMISSION STATUS: Inpatient  CODE STATUS: Full Advance Directive Documentation        Most Recent Value   Type of Advance Directive  Living  will   Pre-existing out of facility DNR order (yellow form or pink MOST form)     "MOST" Form in Place?        TOTAL TIME TAKING CARE OF THIS PATIENT: 45 minutes.    Gusta Marksberry FIELDING 12/18/2014, 8:56 PM  [facility]  Office  [phone]  CC: Primary care physician; Elizabeth Sauer, MD

## 2014-12-18 NOTE — ED Notes (Signed)
Hospitalist at bedside 

## 2014-12-19 LAB — CBC
HEMATOCRIT: 37.9 % (ref 35.0–47.0)
Hemoglobin: 12.4 g/dL (ref 12.0–16.0)
MCH: 28.3 pg (ref 26.0–34.0)
MCHC: 32.6 g/dL (ref 32.0–36.0)
MCV: 86.9 fL (ref 80.0–100.0)
Platelets: 118 10*3/uL — ABNORMAL LOW (ref 150–440)
RBC: 4.36 MIL/uL (ref 3.80–5.20)
RDW: 16.3 % — ABNORMAL HIGH (ref 11.5–14.5)
WBC: 11.4 10*3/uL — ABNORMAL HIGH (ref 3.6–11.0)

## 2014-12-19 LAB — BASIC METABOLIC PANEL
Anion gap: 6 (ref 5–15)
BUN: 17 mg/dL (ref 6–20)
CALCIUM: 8.4 mg/dL — AB (ref 8.9–10.3)
CO2: 32 mmol/L (ref 22–32)
CREATININE: 0.73 mg/dL (ref 0.44–1.00)
Chloride: 99 mmol/L — ABNORMAL LOW (ref 101–111)
GFR calc non Af Amer: >60 mL/min (ref >60)
Glucose, Bld: 115 mg/dL — ABNORMAL HIGH (ref 65–99)
Potassium: 3 mmol/L — ABNORMAL LOW (ref 3.5–5.1)
Sodium: 137 mmol/L (ref 135–145)

## 2014-12-19 LAB — MAGNESIUM: MAGNESIUM: 1.9 mg/dL (ref 1.7–2.4)

## 2014-12-19 MED ORDER — POTASSIUM CHLORIDE CRYS ER 20 MEQ PO TBCR
40.0000 meq | EXTENDED_RELEASE_TABLET | Freq: Once | ORAL | Status: AC
  Administered 2014-12-19: 40 meq via ORAL
  Filled 2014-12-19: qty 2

## 2014-12-19 MED ORDER — CEFAZOLIN SODIUM 1-5 GM-% IV SOLN
1.0000 g | Freq: Three times a day (TID) | INTRAVENOUS | Status: DC
  Administered 2014-12-19: 1 g via INTRAVENOUS
  Filled 2014-12-19 (×7): qty 50

## 2014-12-19 MED ORDER — POTASSIUM CHLORIDE CRYS ER 20 MEQ PO TBCR
20.0000 meq | EXTENDED_RELEASE_TABLET | Freq: Two times a day (BID) | ORAL | Status: DC
  Administered 2014-12-19 – 2014-12-21 (×4): 20 meq via ORAL
  Filled 2014-12-19 (×4): qty 1

## 2014-12-19 MED ORDER — FUROSEMIDE 40 MG PO TABS
40.0000 mg | ORAL_TABLET | Freq: Every day | ORAL | Status: DC
  Administered 2014-12-19: 40 mg via ORAL
  Filled 2014-12-19: qty 1

## 2014-12-19 MED ORDER — PNEUMOCOCCAL VAC POLYVALENT 25 MCG/0.5ML IJ INJ
0.5000 mL | INJECTION | INTRAMUSCULAR | Status: DC
  Filled 2014-12-19: qty 0.5

## 2014-12-19 MED ORDER — CEFAZOLIN SODIUM 1-5 GM-% IV SOLN
1.0000 g | Freq: Three times a day (TID) | INTRAVENOUS | Status: DC
  Administered 2014-12-19 – 2014-12-20 (×2): 1 g via INTRAVENOUS
  Filled 2014-12-19 (×3): qty 50

## 2014-12-19 NOTE — Progress Notes (Signed)
Patient experiencing shortness of breath stating "it feels like I ate too much and there's pressure on my stomach". Checked oxygen saturation, 97%. Put 2L of oxygen back on patient for comfort due to SOB. Patient is currently lying back in bed with some improvement. Will continue to monitor.

## 2014-12-19 NOTE — Progress Notes (Signed)
Pt. Ate 100% sandwich and went to sleep. Pt slept most of the night. She used bedside commode. This A.M. With stand by assist. VSS. O2 taken off and pt. Has been sating in mid-nineties on R/A. No signs or c/o pan noted. Will continue to monitor pt.

## 2014-12-19 NOTE — Progress Notes (Signed)
Pt. Arrived to unit via stretcher. Skin assessed. RLE has redness, warmth and edema, LLE has edema only. Pt. Given room orientation. Tele placed on pt. Pt assisted in bed. Pt is a stand by assist. NS at 60ml/h was started. Rocephin was given. Pt. Verbally stated she under stand all education given. Pt. Given CHF pamphlet and [place] booklet. Will continue to monitor pt.

## 2014-12-19 NOTE — Progress Notes (Signed)
Patient ID: [name], female   DOB: 1940-09-05, 74 y.o.   MRN: [id number] [facility] PROGRESS NOTE  PCP: Elizabeth Sauer, MD  HPI/Subjective: Patient feeling tired. Noticed redness on her leg and family member wanted her come in for further evaluation. Patient has had a history of this before where she had a cellulitis on the right lower extremity and had an infection in the blood.  Objective: Filed Vitals:   12/19/14 0830  BP: 128/60  Pulse: 78  Temp:   Resp:     Intake/Output Summary (Last 24 hours) at 12/19/14 0938 Last data filed at 12/19/14 0834  Gross per 24 hour  Intake    653 ml  Output      0 ml  Net    653 ml   Filed Weights   12/18/14 1707 12/18/14 2214 12/19/14 0358  Weight: 94.802 kg (209 lb) 95.754 kg (211 lb 1.6 oz) 96.253 kg (212 lb 3.2 oz)    ROS: Review of Systems  Constitutional: Negative for fever and chills.  Eyes: Negative for blurred vision.  Respiratory: Positive for shortness of breath. Negative for cough.   Cardiovascular: Negative for chest pain.  Gastrointestinal: Negative for nausea, vomiting, abdominal pain, diarrhea and constipation.  Genitourinary: Negative for dysuria.  Musculoskeletal: Negative for joint pain.  Neurological: Negative for dizziness and headaches.   Exam: Physical Exam  HENT:  Nose: No mucosal edema.  Mouth/Throat: No oropharyngeal exudate or posterior oropharyngeal edema.  Eyes: Conjunctivae, EOM and lids are normal. Pupils are equal, round, and reactive to light.  Neck: No JVD present. Carotid bruit is not present. No edema present. No thyroid mass and no thyromegaly present.  Cardiovascular: S1 normal and S2 normal.  An irregularly irregular rhythm present. Exam reveals no gallop.   No murmur heard. Pulses:      Dorsalis pedis pulses are 1+ on the right side, and 1+ on the left side.  Respiratory: No respiratory distress. She has decreased breath sounds in the right lower field and the left lower  field. She has no wheezes. She has no rhonchi. She has no rales.  GI: Soft. Bowel sounds are normal. There is no tenderness.  Musculoskeletal:       Right ankle: She exhibits swelling.       Left ankle: She exhibits swelling.  Lymphadenopathy:    She has no cervical adenopathy.  Neurological: She is alert. No cranial nerve deficit.  Skin: Skin is warm. Rash noted. Nails show no clubbing.  Warmth and erythema seen on the right lower extremity. Erythema mostly anteriorly, very blotchy in nature, some small pustules.  Psychiatric: She has a normal mood and affect.   Data Reviewed: Basic Metabolic Panel:  Recent Labs Lab 12/18/14 1934 12/19/14 0349  NA 138 137  K 3.2* 3.0*  CL 95* 99*  CO2 32 32  GLUCOSE 118* 115*  BUN 19 17  CREATININE 0.90 0.73  CALCIUM 9.3 8.4*  MG  --  1.9   CBC:  Recent Labs Lab 12/18/14 1934 12/19/14 0349  WBC 14.7* 11.4*  NEUTROABS 11.2*  --   HGB 14.2 12.4  HCT 44.0 37.9  MCV 86.4 86.9  PLT 144* 118*     Studies: Dg Chest Port 1 View  12/18/2014   CLINICAL DATA:  Increasing shortness of breath today.  EXAM: PORTABLE CHEST - 1 VIEW  COMPARISON:  08/24/2014 and prior radiographs dating back to 02/26/2009  FINDINGS: Cardiomegaly again noted.  There is no evidence of  focal airspace disease, pulmonary edema, suspicious pulmonary nodule/mass, pleural effusion, or pneumothorax. No acute bony abnormalities are identified.  IMPRESSION: Cardiomegaly without evidence of active cardiopulmonary disease.   Electronically Signed   By: Harmon Pier M.D.   On: 12/18/2014 19:24    Scheduled Meds: . aspirin EC  81 mg Oral Daily  . carvedilol  25 mg Oral BID WC  .  ceFAZolin (ANCEF) IV  1 g Intravenous 3 times per day  . enoxaparin (LOVENOX) injection  40 mg Subcutaneous Q24H  . [START ON 12/20/2014] pneumococcal 23 valent vaccine  0.5 mL Intramuscular Tomorrow-1000  . sodium chloride  3 mL Intravenous Q12H   Continuous Infusions:    Assessment/Plan:  1. Clinical sepsis, cellulitis of the right lower extremity, tachycardia and leukocytosis. Also acute cystitis with hematuria. Follow-up blood cultures. I will order a urine culture (not sent from ER). Switch antibiotically to Ancef. 2. Atrial fibrillation with rapid ventricular response- now rate controlled on Coreg. Anticoagulation with aspirin. 3. Lymphedema and swelling of the lower extremities restart Lasix and stop IV fluid hydration. 4. Hypokalemia replace orally and continue replacement with diuretic. 5. Thrombocytopenia could be secondary to sepsis. Continue to monitor.  Code Status:     Code Status Orders        Start     Ordered   12/18/14 2228  Full code   Continuous     12/18/14 2227    Advance Directive Documentation        Most Recent Value   Type of Advance Directive  Living will   Pre-existing out of facility DNR order (yellow form or pink MOST form)     "MOST" Form in Place?       Disposition Plan: Home  Time spent: 25 minutes  Alford Highland  [facility]

## 2014-12-19 NOTE — Progress Notes (Signed)
Alert and oriented. No complaints of pain. Afib on tele. Right leg remains red and warm to the touch, per patient it has improved slightly. Blood cultures have been negative so far. Urine culture pending. Abx changed to ancef every 8 hours today. Patient has ambulated well with walker to bathroom. No complaints. Will continue to monitor.

## 2014-12-20 LAB — CBC
HEMATOCRIT: 39.1 % (ref 35.0–47.0)
HEMOGLOBIN: 12.5 g/dL (ref 12.0–16.0)
MCH: 28.6 pg (ref 26.0–34.0)
MCHC: 32.1 g/dL (ref 32.0–36.0)
MCV: 88.9 fL (ref 80.0–100.0)
PLATELETS: 110 10*3/uL — AB (ref 150–440)
RBC: 4.4 MIL/uL (ref 3.80–5.20)
RDW: 16.2 % — ABNORMAL HIGH (ref 11.5–14.5)
WBC: 6.7 10*3/uL (ref 3.6–11.0)

## 2014-12-20 LAB — BASIC METABOLIC PANEL
ANION GAP: 5 (ref 5–15)
BUN: 17 mg/dL (ref 6–20)
CHLORIDE: 100 mmol/L — AB (ref 101–111)
CO2: 36 mmol/L — ABNORMAL HIGH (ref 22–32)
Calcium: 8.7 mg/dL — ABNORMAL LOW (ref 8.9–10.3)
Creatinine, Ser: 0.86 mg/dL (ref 0.44–1.00)
GFR calc Af Amer: >60 mL/min (ref >60)
GFR calc non Af Amer: >60 mL/min (ref >60)
GLUCOSE: 118 mg/dL — AB (ref 65–99)
POTASSIUM: 3.8 mmol/L (ref 3.5–5.1)
Sodium: 141 mmol/L (ref 135–145)

## 2014-12-20 MED ORDER — IPRATROPIUM-ALBUTEROL 0.5-2.5 (3) MG/3ML IN SOLN
3.0000 mL | Freq: Four times a day (QID) | RESPIRATORY_TRACT | Status: DC
  Administered 2014-12-20 (×2): 3 mL via RESPIRATORY_TRACT
  Filled 2014-12-20 (×3): qty 3

## 2014-12-20 MED ORDER — FUROSEMIDE 10 MG/ML IJ SOLN
40.0000 mg | Freq: Once | INTRAMUSCULAR | Status: AC
  Administered 2014-12-20: 40 mg via INTRAVENOUS
  Filled 2014-12-20: qty 4

## 2014-12-20 MED ORDER — POLYETHYLENE GLYCOL 3350 17 G PO PACK
17.0000 g | PACK | Freq: Every day | ORAL | Status: DC
  Administered 2014-12-20: 17 g via ORAL
  Filled 2014-12-20 (×2): qty 1

## 2014-12-20 MED ORDER — VANCOMYCIN HCL IN DEXTROSE 1-5 GM/200ML-% IV SOLN
1000.0000 mg | Freq: Once | INTRAVENOUS | Status: AC
  Administered 2014-12-20: 1000 mg via INTRAVENOUS
  Filled 2014-12-20: qty 200

## 2014-12-20 MED ORDER — CEPHALEXIN 500 MG PO CAPS
500.0000 mg | ORAL_CAPSULE | Freq: Three times a day (TID) | ORAL | Status: DC
  Administered 2014-12-20 – 2014-12-21 (×4): 500 mg via ORAL
  Filled 2014-12-20 (×6): qty 1

## 2014-12-20 NOTE — Care Management (Signed)
Discussed during progression the need to maintain independent functional status and to check room air 02 sats.  It is not known why patient has been placed on 02.  It is reported that patient has not been admitted with any type of condition that is affecting  Respiratory status.

## 2014-12-20 NOTE — Progress Notes (Signed)
VSS. Up to chair and tolerated it well. WEan to room air. A fib. Takes meds ok. IV abx started. Ambulated around the nurses station. Family at the bedside. Leg edema is +3. Pt has not reported any pain. Pt has no further concerns at this time.

## 2014-12-20 NOTE — Progress Notes (Signed)
Notified MD weiting that aerobic bottle is positive for cocci and clusters

## 2014-12-20 NOTE — Progress Notes (Signed)
Notified MD weiting of 5 beat run of svt, will follow

## 2014-12-20 NOTE — Progress Notes (Signed)
Patient ID: [name], female   DOB: 1940/09/25, 74 y.o.   MRN: [id number] Nurse got called with positive blood culture in one bottle gram-positive cocci in clusters. I will give a dose of IV vancomycin now. This could still be a contaminant. We'll have to wait for ID and sensitivities prior to discharge home.

## 2014-12-20 NOTE — Progress Notes (Signed)
Patient ID: [name], female   DOB: 1941-05-12, 74 y.o.   MRN: [id number] [facility] PROGRESS NOTE  PCP: Elizabeth Sauer, MD  HPI/Subjective: Patient feeling short of breath this morning. She feels like there is something in her upper stomach area. She feels this quite often. She doesn't want anything for her stomach. She gets this way when she hasn't had a bowel movement. Right leg is looking and feeling better.  Objective: Filed Vitals:   12/20/14 0400  BP: 122/86  Pulse: 66  Temp: 98 F (36.7 C)  Resp: 18    Intake/Output Summary (Last 24 hours) at 12/20/14 0747 Last data filed at 12/20/14 0412  Gross per 24 hour  Intake 1000.5 ml  Output   1850 ml  Net -849.5 ml   Filed Weights   12/18/14 2214 12/19/14 0358 12/20/14 0400  Weight: 95.754 kg (211 lb 1.6 oz) 96.253 kg (212 lb 3.2 oz) 95.8 kg (211 lb 3.2 oz)    ROS: Review of Systems  Constitutional: Negative for fever and chills.  Eyes: Negative for blurred vision.  Respiratory: Positive for shortness of breath. Negative for cough.   Cardiovascular: Negative for chest pain.  Gastrointestinal: Negative for nausea, vomiting, abdominal pain, diarrhea and constipation.  Genitourinary: Negative for dysuria.  Musculoskeletal: Negative for joint pain.  Neurological: Negative for dizziness and headaches.   Exam: Physical Exam  HENT:  Nose: No mucosal edema.  Mouth/Throat: No oropharyngeal exudate or posterior oropharyngeal edema.  Eyes: Conjunctivae, EOM and lids are normal. Pupils are equal, round, and reactive to light.  Neck: No JVD present. Carotid bruit is not present. No edema present. No thyroid mass and no thyromegaly present.  Cardiovascular: S1 normal and S2 normal.  An irregularly irregular rhythm present. Exam reveals no gallop.   No murmur heard. Pulses:      Dorsalis pedis pulses are 1+ on the right side, and 1+ on the left side.  Respiratory: No respiratory distress. She has decreased  breath sounds in the right lower field and the left lower field. She has wheezes in the left upper field. She has no rhonchi. She has rales in the right lower field and the left lower field.  GI: Soft. Bowel sounds are normal. There is no tenderness.  Musculoskeletal:       Right ankle: She exhibits swelling.       Left ankle: She exhibits swelling.  Lymphadenopathy:    She has no cervical adenopathy.  Neurological: She is alert. No cranial nerve deficit.  Skin: Skin is warm. Rash noted. Nails show no clubbing.  Warmth and erythema much improved this morning. Chronic lower extremity discoloration bilaterally.  Psychiatric: She has a normal mood and affect.   Data Reviewed: Basic Metabolic Panel:  Recent Labs Lab 12/18/14 1934 12/19/14 0349 12/20/14 0414  NA 138 137 141  K 3.2* 3.0* 3.8  CL 95* 99* 100*  CO2 32 32 36*  GLUCOSE 118* 115* 118*  BUN 19 17 17   CREATININE 0.90 0.73 0.86  CALCIUM 9.3 8.4* 8.7*  MG  --  1.9  --    CBC:  Recent Labs Lab 12/18/14 1934 12/19/14 0349 12/20/14 0414  WBC 14.7* 11.4* 6.7  NEUTROABS 11.2*  --   --   HGB 14.2 12.4 12.5  HCT 44.0 37.9 39.1  MCV 86.4 86.9 88.9  PLT 144* 118* 110*   Scheduled Meds: . aspirin EC  81 mg Oral Daily  . carvedilol  25 mg Oral BID WC  .  cephALEXin  500 mg Oral 3 times per day  . enoxaparin (LOVENOX) injection  40 mg Subcutaneous Q24H  . furosemide  40 mg Intravenous Once  . ipratropium-albuterol  3 mL Nebulization Q6H  . pneumococcal 23 valent vaccine  0.5 mL Intramuscular Tomorrow-1000  . polyethylene glycol  17 g Oral Daily  . potassium chloride  20 mEq Oral BID  . sodium chloride  3 mL Intravenous Q12H   Continuous Infusions:   Assessment/Plan:  1. Clinical sepsis, cellulitis of the right lower extremity, tachycardia and leukocytosis. Also acute cystitis with hematuria. Cellulitis improved. Will switch over to by mouth Keflex. 2. Shortness of breath: This could be fluid overload secondary to the  patient being given IV fluids yesterday. I will give a dose of IV Lasix this morning. And reevaluate late morning and early afternoon for potential discharge today versus tomorrow. 3. Atrial fibrillation with rapid ventricular response- now rate controlled on Coreg. Anticoagulation with aspirin. 4. Lymphedema and swelling of the lower extremities restart Lasix and stop IV fluid hydration. 5. Hypokalemia replace orally and continue replacement with diuretic. 6. Thrombocytopenia could be secondary to sepsis. Continue to monitor.  Code Status:     Code Status Orders        Start     Ordered   12/18/14 2228  Full code   Continuous     12/18/14 2227    Advance Directive Documentation        Most Recent Value   Type of Advance Directive  Living will   Pre-existing out of facility DNR order (yellow form or pink MOST form)     "MOST" Form in Place?       Disposition Plan: Home  Time spent: 25 minutes now.  Alford Highland  [facility]

## 2014-12-21 LAB — URINE CULTURE
Culture: >=100000
Special Requests: NORMAL

## 2014-12-21 MED ORDER — FUROSEMIDE 40 MG PO TABS
40.0000 mg | ORAL_TABLET | Freq: Every day | ORAL | Status: DC
  Administered 2014-12-21: 40 mg via ORAL
  Filled 2014-12-21: qty 1

## 2014-12-21 MED ORDER — POTASSIUM CHLORIDE ER 10 MEQ PO TBCR: 10.0000 meq | EXTENDED_RELEASE_TABLET | Freq: Every day | ORAL | Status: DC

## 2014-12-21 MED ORDER — CEPHALEXIN 500 MG PO CAPS: 500.0000 mg | ORAL_CAPSULE | Freq: Three times a day (TID) | ORAL | Status: DC

## 2014-12-21 MED ORDER — IPRATROPIUM-ALBUTEROL 0.5-2.5 (3) MG/3ML IN SOLN: 3.0000 mL | Freq: Four times a day (QID) | RESPIRATORY_TRACT | Status: DC | PRN

## 2014-12-21 NOTE — Discharge Instructions (Signed)

## 2014-12-21 NOTE — Discharge Summary (Signed)
[facility] [place] [facility]   PATIENT NAME: [name]    MR#:  [id number]  DATE OF BIRTH:  12-25-1940  DATE OF ADMISSION:  12/18/2014 ADMITTING PHYSICIAN: Oralia Manis, MD  DATE OF DISCHARGE: 12/21/2014  PRIMARY CARE PHYSICIAN: Elizabeth Sauer, MD    ADMISSION DIAGNOSIS:  Cellulitis of right leg [L03.115] Sepsis affecting skin [L02.91]  DISCHARGE DIAGNOSIS:  Principal Problem:   Sepsis Active Problems:   Cellulitis of leg, right   HTN (hypertension)   Bilateral lower extremity edema   Atrial fibrillation with RVR   HLD (hyperlipidemia)   SECONDARY DIAGNOSIS:   Past Medical History  Diagnosis Date  . Hypertension   . Atrial fibrillation   . HLD (hyperlipidemia)   . Valvular heart disease   . Pulmonary HTN   . Lower extremity edema     HOSPITAL COURSE:   1. Clinical sepsis, cellulitis of the right lower extremity. Patient was initially started on aggressive antibiotics. I switched her over to IV Ancef and then to by mouth Keflex. Cellulitis improved rapidly. A blood culture came back positive on 12/20/2014 but final results is a coagulase negative Staphylococcus which is a contaminant.   2. Atrial fibrillation with rapid ventricular response-this improved rapidly with restarting her beta blocker she is on aspirin for anticoagulation. 3. Shortness of breath on 12/20/2014. Could be secondary to fluid overload since the patient was held on her Lasix and given IV fluids. This improved rapidly with 1 dose of IV Lasix. 4. Lower extremity edema- patient restarted on her Lasix. 5. Essential hypertension blood pressure is stable on Coreg. 6. Hypokalemia- replaced during hospital course and upon discharge.  DISCHARGE CONDITIONS:   Satisfactory  CONSULTS OBTAINED:  None   DRUG ALLERGIES:  No Known Allergies  DISCHARGE MEDICATIONS:   Current Discharge Medication List    START taking these medications   Details  cephALEXin  (KEFLEX) 500 MG capsule Take 1 capsule (500 mg total) by mouth 3 (three) times daily. Qty: 24 capsule, Refills: 0    potassium chloride (K-DUR) 10 MEQ tablet Take 1 tablet (10 mEq total) by mouth daily. Qty: 30 tablet, Refills: 0      CONTINUE these medications which have NOT CHANGED   Details  aspirin EC 81 MG tablet Take 81 mg by mouth daily.    carvedilol (COREG) 25 MG tablet Take 25 mg by mouth 2 (two) times daily with a meal.    Cyanocobalamin (VITAMIN B-12 PO) Take 1 tablet by mouth daily.    furosemide (LASIX) 40 MG tablet Take 40 mg by mouth daily.         DISCHARGE INSTRUCTIONS:   Follow-up with DNA Jones 1-2 weeks. Follow-up with Dr. Gwen Pounds as needed and as scheduled.   If you experience worsening of your admission symptoms, develop shortness of breath, life threatening emergency, suicidal or homicidal thoughts you must seek medical attention immediately by calling 911 or calling your MD immediately  if symptoms less severe.  You Must read complete instructions/literature along with all the possible adverse reactions/side effects for all the Medicines you take and that have been prescribed to you. Take any new Medicines after you have completely understood and accept all the possible adverse reactions/side effects.   Please note  You were cared for by a hospitalist during your hospital stay. If you have any questions about your discharge medications or the care you received while you were in the hospital after you are discharged, you can call the  unit and asked to speak with the hospitalist on call if the hospitalist that took care of you is not available. Once you are discharged, your primary care physician will handle any further medical issues. Please note that NO REFILLS for any discharge medications will be authorized once you are discharged, as it is imperative that you return to your primary care physician (or establish a relationship with a primary care physician  if you do not have one) for your aftercare needs so that they can reassess your need for medications and monitor your lab values.    Today   CHIEF COMPLAINT:   Chief Complaint  Patient presents with  . Leg Swelling  . Shortness of Breath    HISTORY OF PRESENT ILLNESS:  [name]  is a 74 y.o. female with a known history of A. fib and hypertension presented with leg swelling and redness and was found to have clinical sepsis with cellulitis of the right lower extremity and also atrial fibrillation with rapid ventricular response.   VITAL SIGNS:  Blood pressure 140/81, pulse 80, temperature 97.8 F (36.6 C), temperature source Oral, resp. rate 18, height  (1.702 m), weight 95.8 kg (211 lb 3.2 oz), SpO2 92 %.  I/O:    Intake/Output Summary (Last 24 hours) at 12/21/14 0841 Last data filed at 12/21/14 0427  Gross per 24 hour  Intake    840 ml  Output   3700 ml  Net  -2860 ml    PHYSICAL EXAMINATION:  GENERAL:  74 y.o.-year-old patient lying in the bed with no acute distress.  EYES: Pupils equal, round, reactive to light and accommodation. No scleral icterus. Extraocular muscles intact.  HEENT: Head atraumatic, normocephalic. Oropharynx and nasopharynx clear.  NECK:  Supple, no jugular venous distention. No thyroid enlargement, no tenderness.  LUNGS: Normal breath sounds bilaterally, no wheezing, rales,rhonchi or crepitation. No use of accessory muscles of respiration.  CARDIOVASCULAR: S1, S2  irregularly irregular with normal rate. Positive 2/6 systolic ejection murmur, no rubs, or gallops.  ABDOMEN: Soft, non-tender, non-distended. Bowel sounds present. No organomegaly or mass.  EXTREMITIES: No pedal edema, cyanosis, or clubbing.  NEUROLOGIC: Cranial nerves II through XII are intact. Muscle strength 5/5 in all extremities. Sensation intact. Gait not checked.  PSYCHIATRIC: The patient is alert and oriented x 3.  SKIN: No obvious rash, lesion, or ulcer.   DATA REVIEW:    CBC  Recent Labs Lab 12/20/14 0414  WBC 6.7  HGB 12.5  HCT 39.1  PLT 110*    Chemistries   Recent Labs Lab 12/19/14 0349 12/20/14 0414  NA 137 141  K 3.0* 3.8  CL 99* 100*  CO2 32 36*  GLUCOSE 115* 118*  BUN 17 17  CREATININE 0.73 0.86  CALCIUM 8.4* 8.7*  MG 1.9  --     Management plans discussed with the patient, and she is  in agreement.  CODE STATUS:     Code Status Orders        Start     Ordered   12/18/14 2228  Full code   Continuous     12/18/14 2227    Advance Directive Documentation        Most Recent Value   Type of Advance Directive  Living will   Pre-existing out of facility DNR order (yellow form or pink MOST form)     "MOST" Form in Place?        TOTAL TIME TAKING CARE OF THIS PATIENT: 35 MINUTES.  greater than 50% of the time spent with counseling and coordination of care.    Alford Highland M.D on 12/21/2014 at 8:41 AM  Between 7am to 6pm - Pager - [phone].  After 6pm go to [email or web address] - password [email or web address] [facility]  [facility]  Office  [phone]  CC: Primary care physician; Elizabeth Sauer, MD

## 2014-12-23 LAB — CULTURE, BLOOD (ROUTINE X 2)
CULTURE: NO GROWTH
SPECIAL REQUESTS: NORMAL
SPECIAL REQUESTS: NORMAL

## 2014-12-29 ENCOUNTER — Ambulatory Visit (INDEPENDENT_AMBULATORY_CARE_PROVIDER_SITE_OTHER): Payer: Medicare Other | Admitting: Family Medicine

## 2014-12-29 ENCOUNTER — Encounter: Payer: Self-pay | Admitting: Family Medicine

## 2014-12-29 VITALS — BP 120/70 | HR 68 | Ht 67.0 in | Wt 214.0 lb

## 2014-12-29 DIAGNOSIS — M19019 Primary osteoarthritis, unspecified shoulder: Secondary | ICD-10-CM | POA: Insufficient documentation

## 2014-12-29 DIAGNOSIS — Z09 Encounter for follow-up examination after completed treatment for conditions other than malignant neoplasm: Secondary | ICD-10-CM | POA: Diagnosis not present

## 2014-12-29 DIAGNOSIS — I872 Venous insufficiency (chronic) (peripheral): Secondary | ICD-10-CM

## 2014-12-29 DIAGNOSIS — R0681 Apnea, not elsewhere classified: Secondary | ICD-10-CM | POA: Insufficient documentation

## 2014-12-29 DIAGNOSIS — G5690 Unspecified mononeuropathy of unspecified upper limb: Secondary | ICD-10-CM | POA: Diagnosis not present

## 2014-12-29 DIAGNOSIS — IMO0002 Reserved for concepts with insufficient information to code with codable children: Secondary | ICD-10-CM

## 2014-12-29 DIAGNOSIS — I10 Essential (primary) hypertension: Secondary | ICD-10-CM | POA: Diagnosis not present

## 2014-12-29 DIAGNOSIS — R600 Localized edema: Secondary | ICD-10-CM | POA: Diagnosis not present

## 2014-12-29 DIAGNOSIS — L03119 Cellulitis of unspecified part of limb: Secondary | ICD-10-CM

## 2014-12-29 DIAGNOSIS — L02419 Cutaneous abscess of limb, unspecified: Secondary | ICD-10-CM | POA: Diagnosis not present

## 2014-12-29 HISTORY — DX: Primary osteoarthritis, unspecified shoulder: M19.019

## 2014-12-29 HISTORY — DX: Venous insufficiency (chronic) (peripheral): I87.2

## 2014-12-29 NOTE — Progress Notes (Signed)
Name: [name]   MRN: [id number]    DOB: Jan 21, 1941   Date:12/29/2014       Progress Note  Subjective  Chief Complaint  Chief Complaint  Patient presents with  . Peripheral Neuropathy    hands go numb- can't pick anything up with a "tight grip"    HPI Comments: Hx of significant leg edema / followup for sepsis/ resolving on keflex  Illness The current episode started 1 to 4 weeks ago. The problem has been gradually improving since onset. The pain is moderate. Associated symptoms include fatigue and a fever. Pertinent negatives include no ear discharge, ear pain, headaches, sore throat, stridor, weight gain, weight loss, chest pain, coughing, shortness of breath, wheezing, abdominal pain, constipation, diarrhea, nausea, joint pain, neck pain or rash. Past treatments include one or more prescription drugs. The treatment provided moderate relief.  Extremity Weakness  This is a new problem. The current episode started 1 to 4 weeks ago. There has been a history of trauma. The problem occurs constantly. The problem has been gradually worsening. Associated symptoms include a fever and numbness. Pertinent negatives include no inability to bear weight, itching, joint locking, joint swelling, limited range of motion, stiffness or tingling. The symptoms are aggravated by activity. She has tried nothing for the symptoms. The treatment provided no relief.  Congestive Heart Failure Presents for follow-up visit. Associated symptoms include fatigue and muscle weakness. Pertinent negatives include no abdominal pain, chest pain, palpitations or shortness of breath. The symptoms have been worsening. Past treatments include beta blockers and salt and fluid restriction. The treatment provided mild relief. Compliance with prior treatments has been variable. There is no history of anemia, arrhythmia, CAD, CVA, DM, hyperthyroidism or myocarditis.    No problem-specific assessment & plan notes found for this  encounter.   Past Medical History  Diagnosis Date  . Hypertension   . Atrial fibrillation   . HLD (hyperlipidemia)   . Valvular heart disease   . Pulmonary HTN   . Lower extremity edema     Past Surgical History  Procedure Laterality Date  . Abdominal hysterectomy    . Bilateral wrist surgery    . Left hip replacement    . Joint replacement      hip    Family History  Problem Relation Age of Onset  . Heart attack Father   . Stroke Mother     History   Social History  . Marital Status: Married    Spouse Name: N/A  . Number of Children: N/A  . Years of Education: N/A   Occupational History  . Not on file.   Social History Main Topics  . Smoking status: Former Smoker    Quit date: 04/01/1984  . Smokeless tobacco: Never Used  . Alcohol Use: No  . Drug Use: No  . Sexual Activity: No   Other Topics Concern  . Not on file   Social History Narrative    No Known Allergies   Review of Systems  Constitutional: Positive for fever, malaise/fatigue and fatigue. Negative for chills, weight loss and weight gain.  HENT: Negative for ear discharge, ear pain and sore throat.   Eyes: Negative for blurred vision.  Respiratory: Negative for cough, sputum production, shortness of breath, wheezing and stridor.   Cardiovascular: Negative for chest pain, palpitations, orthopnea and leg swelling.  Gastrointestinal: Negative for heartburn, nausea, abdominal pain, diarrhea, constipation, blood in stool and melena.  Genitourinary: Negative for dysuria, urgency, frequency and hematuria.  Musculoskeletal: Positive for extremity weakness and muscle weakness. Negative for myalgias, back pain, joint pain, stiffness and neck pain.  Skin: Negative for itching and rash.  Neurological: Positive for focal weakness and numbness. Negative for dizziness, tingling, sensory change and headaches.  Endo/Heme/Allergies: Negative for environmental allergies and polydipsia. Does not bruise/bleed  easily.  Psychiatric/Behavioral: Negative for depression and suicidal ideas. The patient is not nervous/anxious and does not have insomnia.      Objective  Filed Vitals:   12/29/14 1046  BP: 120/70  Pulse: 68  Height:  (1.702 m)  Weight: 214 lb (97.07 kg)    Physical Exam  Constitutional: She is well-developed, well-nourished, and in no distress. No distress.  HENT:  Head: Normocephalic and atraumatic.  Right Ear: External ear normal.  Left Ear: External ear normal.  Nose: Nose normal.  Mouth/Throat: Oropharynx is clear and moist.  Eyes: Conjunctivae and EOM are normal. Pupils are equal, round, and reactive to light. Right eye exhibits no discharge. Left eye exhibits no discharge.  Neck: Normal range of motion. Neck supple. No JVD present. No thyromegaly present.  Cardiovascular: Normal rate, regular rhythm, S1 normal, S2 normal, normal heart sounds and intact distal pulses.  Exam reveals no gallop and no friction rub.   No murmur heard. Bilateral edema  Pulmonary/Chest: Effort normal and breath sounds normal. No respiratory distress. She has no wheezes. She has no rales.  Abdominal: Soft. Bowel sounds are normal. She exhibits no mass. There is no tenderness. There is no guarding.  Musculoskeletal: Normal range of motion. She exhibits edema.       Right shoulder: She exhibits swelling and spasm.  Lymphadenopathy:    She has no cervical adenopathy.  Neurological: She is alert. She has normal reflexes.  Skin: Skin is warm and dry. She is not diaphoretic.  Psychiatric: Mood and affect normal.  Nursing note and vitals reviewed.     Assessment & Plan  Problem List Items Addressed This Visit      Cardiovascular and Mediastinum   Benign essential HTN     Other   Bilateral lower extremity edema (Chronic)   Relevant Orders   Ambulatory referral to Vascular Surgery   Abscess or cellulitis of leg   Relevant Orders   Ambulatory referral to Vascular Surgery    Other  Visit Diagnoses    Hospital discharge follow-up    -  Primary    Neuropathy of hand, unspecified laterality        Relevant Orders    Ambulatory referral to Neurology         Dr. Elizabeth Sauer [facility]  12/29/2014

## 2014-12-30 DIAGNOSIS — G5600 Carpal tunnel syndrome, unspecified upper limb: Secondary | ICD-10-CM

## 2014-12-30 HISTORY — DX: Carpal tunnel syndrome, unspecified upper limb: G56.00

## 2015-01-27 ENCOUNTER — Encounter: Payer: Self-pay | Admitting: Family Medicine

## 2015-01-27 ENCOUNTER — Ambulatory Visit (INDEPENDENT_AMBULATORY_CARE_PROVIDER_SITE_OTHER): Payer: Medicare Other | Admitting: Family Medicine

## 2015-01-27 VITALS — BP 105/65 | HR 92 | Temp 98.2°F | Resp 16 | Wt 214.2 lb

## 2015-01-27 DIAGNOSIS — L02419 Cutaneous abscess of limb, unspecified: Secondary | ICD-10-CM | POA: Diagnosis not present

## 2015-01-27 DIAGNOSIS — I872 Venous insufficiency (chronic) (peripheral): Secondary | ICD-10-CM | POA: Diagnosis not present

## 2015-01-27 DIAGNOSIS — Z9119 Patient's noncompliance with other medical treatment and regimen: Secondary | ICD-10-CM | POA: Diagnosis not present

## 2015-01-27 DIAGNOSIS — IMO0002 Reserved for concepts with insufficient information to code with codable children: Secondary | ICD-10-CM

## 2015-01-27 DIAGNOSIS — L03119 Cellulitis of unspecified part of limb: Secondary | ICD-10-CM | POA: Diagnosis not present

## 2015-01-27 DIAGNOSIS — Z91199 Patient's noncompliance with other medical treatment and regimen due to unspecified reason: Secondary | ICD-10-CM

## 2015-01-27 DIAGNOSIS — I890 Lymphedema, not elsewhere classified: Secondary | ICD-10-CM | POA: Diagnosis not present

## 2015-01-27 NOTE — Progress Notes (Signed)
Name: [name]   MRN: [id number]    DOB: Dec 10, 1940   Date:01/27/2015       Progress Note  Subjective  Chief Complaint  Chief Complaint  Patient presents with  . Med follow up    Leg Pain  The incident occurred more than 1 week ago (using for f/u cellulitis). There was no injury mechanism. The patient is experiencing no pain. The pain has been constant since onset. Pertinent negatives include no inability to bear weight, loss of motion, loss of sensation, muscle weakness, numbness or tingling. She has tried elevation (with compression) for the symptoms. The treatment provided no relief.  Constipation This is a recurrent problem. The current episode started more than 1 month ago. The problem has been waxing and waning since onset. Her stool frequency is 2 to 3 times per week. The stool is described as formed and firm. The patient is not on a high fiber diet. She does not exercise regularly. There has been adequate water intake. Pertinent negatives include no abdominal pain, anorexia, back pain, diarrhea, difficulty urinating, fecal incontinence, fever, flatus, hematochezia, hemorrhoids, melena, nausea, rectal pain, vomiting or weight loss. Risk factors include obesity. She has tried nothing for the symptoms. There is no history of abdominal surgery, endocrine disease, inflammatory bowel disease, irritable bowel syndrome, metabolic disease, neurologic disease, neuromuscular disease, psychiatric history or radiation treatment.    No problem-specific assessment & plan notes found for this encounter.   Past Medical History  Diagnosis Date  . Hypertension   . Atrial fibrillation   . HLD (hyperlipidemia)   . Valvular heart disease   . Pulmonary HTN   . Lower extremity edema     Past Surgical History  Procedure Laterality Date  . Abdominal hysterectomy    . Bilateral wrist surgery    . Left hip replacement    . Joint replacement      hip    Family History  Problem Relation Age  of Onset  . Heart attack Father   . Stroke Mother     History   Social History  . Marital Status: Married    Spouse Name: N/A  . Number of Children: N/A  . Years of Education: N/A   Occupational History  . Not on file.   Social History Main Topics  . Smoking status: Former Smoker    Quit date: 04/01/1984  . Smokeless tobacco: Never Used  . Alcohol Use: No  . Drug Use: No  . Sexual Activity: No   Other Topics Concern  . Not on file   Social History Narrative    No Known Allergies   Review of Systems  Constitutional: Negative for fever, chills, weight loss, malaise/fatigue and diaphoresis.  HENT: Negative for congestion, ear discharge, ear pain, hearing loss, nosebleeds, sore throat and tinnitus.   Eyes: Negative for blurred vision.  Respiratory: Negative for cough, sputum production, shortness of breath, wheezing and stridor.   Cardiovascular: Negative for chest pain, palpitations and leg swelling.  Gastrointestinal: Positive for constipation. Negative for heartburn, nausea, vomiting, abdominal pain, diarrhea, blood in stool, melena, hematochezia, rectal pain, anorexia, flatus and hemorrhoids.  Genitourinary: Negative for dysuria, urgency, frequency, hematuria and difficulty urinating.  Musculoskeletal: Negative for myalgias, back pain, joint pain and neck pain.  Skin: Negative for itching and rash.  Neurological: Negative for dizziness, tingling, sensory change, focal weakness, weakness, numbness and headaches.  Endo/Heme/Allergies: Negative for environmental allergies and polydipsia. Does not bruise/bleed easily.  Psychiatric/Behavioral: Negative for depression and  suicidal ideas. The patient is not nervous/anxious and does not have insomnia.      Objective  Filed Vitals:   01/27/15 1344  BP: 105/65  Pulse: 92  Temp: 98.2 F (36.8 C)  Resp: 16  Weight: 214 lb 3.2 oz (97.16 kg)  SpO2: 95%    Physical Exam  Constitutional: She is well-developed,  well-nourished, and in no distress. No distress.  HENT:  Head: Normocephalic and atraumatic.  Right Ear: External ear normal.  Left Ear: External ear normal.  Nose: Nose normal.  Mouth/Throat: Oropharynx is clear and moist.  Eyes: Conjunctivae and EOM are normal. Pupils are equal, round, and reactive to light. Right eye exhibits no discharge. Left eye exhibits no discharge.  Neck: Normal range of motion. Neck supple. No JVD present. No thyromegaly present.  Cardiovascular: Normal rate, regular rhythm, normal heart sounds and intact distal pulses.  Exam reveals no gallop and no friction rub.   No murmur heard. Pulmonary/Chest: Effort normal and breath sounds normal.  Abdominal: Soft. Bowel sounds are normal. She exhibits no mass. There is no tenderness. There is no guarding.  Musculoskeletal: Normal range of motion. She exhibits edema.  Lymphadenopathy:    She has no cervical adenopathy.  Neurological: She is alert. She has normal reflexes.  Skin: Skin is warm and dry. No rash noted. She is not diaphoretic. No erythema. No pallor.  Psychiatric: Mood and affect normal.      Assessment & Plan  Problem List Items Addressed This Visit      Cardiovascular and Mediastinum   Chronic venous insufficiency - Primary   Relevant Medications   aspirin EC 81 MG tablet   carvedilol (COREG) 25 MG tablet   furosemide (LASIX) 80 MG tablet   Other Relevant Orders   Ambulatory referral to Vascular Surgery     Other   RESOLVED: Abscess or cellulitis of leg   Relevant Orders   Ambulatory referral to Gastroenterology    Other Visit Diagnoses    Chronic acquired lymphedema        Relevant Orders    Ambulatory referral to Vascular Surgery    Noncompliance        unable to use compression regualr basis         Dr. Hayden [facility]  01/27/2015

## 2015-01-28 ENCOUNTER — Telehealth: Payer: Self-pay | Admitting: Surgery

## 2015-01-28 ENCOUNTER — Encounter: Payer: Self-pay | Admitting: Gastroenterology

## 2015-01-28 NOTE — Telephone Encounter (Signed)
I have called pt to make appointment per referral. No answer. I have left a voicemail for pt to call back to schedule.

## 2015-02-23 ENCOUNTER — Other Ambulatory Visit: Payer: Self-pay

## 2015-02-28 ENCOUNTER — Encounter: Payer: Self-pay | Admitting: Gastroenterology

## 2015-02-28 ENCOUNTER — Ambulatory Visit (INDEPENDENT_AMBULATORY_CARE_PROVIDER_SITE_OTHER): Payer: Medicare Other | Admitting: Gastroenterology

## 2015-02-28 VITALS — BP 125/81 | HR 85 | Temp 97.5°F | Ht 67.0 in | Wt 218.0 lb

## 2015-02-28 DIAGNOSIS — K5900 Constipation, unspecified: Secondary | ICD-10-CM | POA: Diagnosis not present

## 2015-02-28 DIAGNOSIS — R194 Change in bowel habit: Secondary | ICD-10-CM | POA: Diagnosis not present

## 2015-02-28 NOTE — Progress Notes (Signed)
Gastroenterology Consultation  Referring Provider:     Duanne Limerick, MD Primary Care Physician:  Elizabeth Sauer, MD Primary Gastroenterologist:  Dr. Servando Snare     Reason for Consultation:     Constipation        HPI:   [name] is a 74 y.o. y/o female referred for consultation & management of constipation by Dr. Elizabeth Sauer, MD.  This patient comes today with a report of not having a colonoscopy for many years. She thinks it was in the 80s that she had her last colonoscopy. The patient now comes in with worsening constipation. She states that this is been going on for the last month. The patient also reports that she has seen some blood in her stools. There is no report of any unexplained weight loss. The patient also reports that she is short of breath today although she is not always short of breath she states she has some intermittent episodes of shortness of breath. The patient suffers from lymphedema of her lower extremities. She also states that she has had surgery on her arms in the past with the last surgery being in 2012. There is no report of any abdominal pain.  Past Medical History  Diagnosis Date  . Hypertension   . Atrial fibrillation   . HLD (hyperlipidemia)   . Valvular heart disease   . Pulmonary HTN   . Lower extremity edema   . HTN (hypertension) 12/18/2014  . A-fib 12/18/2014  . Benign essential HTN 10/25/2014  . Chronic venous insufficiency 12/29/2014  . Chronic systolic heart failure 05/18/2014    Overview:  Global ef 35%   . 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 Carpal tunnel syndrome 12/30/2014  . 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 Degenerative arthritis of hip 06/08/2014  . Bilateral lower extremity edema 12/18/2014  . Cellulitis of leg, right 12/18/2014  . History of repair of hip joint 09/19/2014    Past Surgical History  Procedure Laterality Date  . Abdominal hysterectomy    . Bilateral wrist surgery    . Left hip replacement    . Joint  replacement      hip  . Cataract surgery Bilateral     Prior to Admission medications   Medication Sig Start Date End Date Taking? Authorizing Provider  aspirin EC 81 MG tablet Take by mouth.   Yes Historical Provider, MD  carvedilol (COREG) 25 MG tablet Take by mouth. 09/22/14  Yes Historical Provider, MD  cyanocobalamin ([facility] VITAMIN B12) 2000 MCG tablet Take by mouth.   Yes Historical Provider, MD  furosemide (LASIX) 80 MG tablet Take by mouth. 09/22/14  Yes Historical Provider, MD  triamcinolone cream (KENALOG) 0.5 % Apply topically. 09/13/14 09/13/15 Yes Historical Provider, MD    Family History  Problem Relation Age of Onset  . Heart attack Father   . Stroke Mother      Social History  Substance Use Topics  . Smoking status: Former Smoker    Quit date: 04/01/1984  . Smokeless tobacco: Never Used  . Alcohol Use: No    Allergies as of 02/28/2015  . (No Known Allergies)    Review of Systems:    All systems reviewed and negative except where noted in HPI.   Physical Exam:  BP 125/81 mmHg  Pulse 85  Temp(Src) 97.5 F (36.4 C) (Oral)  Ht 5\' 7"  (1.702 m)  Wt 218 lb (98.884 kg)  BMI 34.14 kg/m2  LMP  (Exact Date) No LMP  recorded (exact date). Patient is postmenopausal. Psych:  Alert and cooperative. Normal mood and affect. General:   Alert,  Well-developed, obese, well-nourished, pleasant and cooperative in NAD Head:  Normocephalic and atraumatic. Eyes:  Sclera clear, no icterus.   Conjunctiva pink. Ears:  Normal auditory acuity. Nose:  No deformity, discharge, or lesions. Mouth:  No deformity or lesions,oropharynx pink & moist. Neck:  Supple; no masses or thyromegaly. Lungs:  Respirations even and unlabored.  Clear throughout to auscultation.   No wheezes, crackles, or rhonchi. No acute distress. Heart:  Regular rate and rhythm; no murmurs, clicks, rubs, or gallops. Abdomen:  Normal bowel sounds.  No bruits.  Soft, non-tender and non-distended without masses,  hepatosplenomegaly or hernias noted.  No guarding or rebound tenderness.  Negative Carnett sign.   Rectal:  Deferred.  Msk:  Symmetrical without gross deformities.  Pulses:   Extremities:  No clubbing but edema with pitting was noted.  No cyanosis. Neurologic:  Alert and oriented x3;  grossly normal neurologically. Skin:  Intact without significant lesions or rashes.  No jaundice. Lymph Nodes:  No significant cervical adenopathy. Psych:  Alert and cooperative. Normal mood and affect.  Imaging Studies: No results found.  Assessment and Plan:   [name] is a 74 y.o. y/o female who comes in with a history of constipation that has been a recent change in her bowel habits. The patient also has had some rectal bleeding. The patient has not had a colonoscopy in many years. The patient will need to be set up for a colonoscopy. The patient reports that Dr. Gwen Pounds is her cardiologist and takes care of her heart. She reports that she has valvular heart disease. The patient will have clearance by her cardiologist prior to setting her up for a colonoscopy. She has been explained the plan and agrees with it.   Note: This dictation was prepared with Dragon dictation along with smaller phrase technology. Any transcriptional errors that result from this process are unintentional.

## 2015-03-12 ENCOUNTER — Encounter: Payer: Self-pay | Admitting: Adult Health

## 2015-03-12 ENCOUNTER — Emergency Department: Payer: Medicare Other

## 2015-03-12 ENCOUNTER — Emergency Department
Admission: EM | Admit: 2015-03-12 | Discharge: 2015-03-13 | Disposition: A | Discharge status: Other Acute Inpatient Hospital | Payer: Medicare Other | Source: Home / Self Care | Attending: Emergency Medicine | Admitting: Emergency Medicine

## 2015-03-12 DIAGNOSIS — E785 Hyperlipidemia, unspecified: Secondary | ICD-10-CM | POA: Diagnosis present

## 2015-03-12 DIAGNOSIS — I10 Essential (primary) hypertension: Secondary | ICD-10-CM

## 2015-03-12 DIAGNOSIS — Y9289 Other specified places as the place of occurrence of the external cause: Secondary | ICD-10-CM

## 2015-03-12 DIAGNOSIS — Z87891 Personal history of nicotine dependence: Secondary | ICD-10-CM

## 2015-03-12 DIAGNOSIS — S12690A Other displaced fracture of seventh cervical vertebra, initial encounter for closed fracture: Secondary | ICD-10-CM

## 2015-03-12 DIAGNOSIS — I5022 Chronic systolic (congestive) heart failure: Secondary | ICD-10-CM | POA: Diagnosis present

## 2015-03-12 DIAGNOSIS — I272 Other secondary pulmonary hypertension: Secondary | ICD-10-CM | POA: Diagnosis present

## 2015-03-12 DIAGNOSIS — Y9301 Activity, walking, marching and hiking: Secondary | ICD-10-CM

## 2015-03-12 DIAGNOSIS — L03119 Cellulitis of unspecified part of limb: Secondary | ICD-10-CM

## 2015-03-12 DIAGNOSIS — S22010A Wedge compression fracture of first thoracic vertebra, initial encounter for closed fracture: Secondary | ICD-10-CM | POA: Insufficient documentation

## 2015-03-12 DIAGNOSIS — Y998 Other external cause status: Secondary | ICD-10-CM

## 2015-03-12 DIAGNOSIS — I96 Gangrene, not elsewhere classified: Secondary | ICD-10-CM | POA: Diagnosis present

## 2015-03-12 DIAGNOSIS — I872 Venous insufficiency (chronic) (peripheral): Secondary | ICD-10-CM | POA: Diagnosis present

## 2015-03-12 DIAGNOSIS — W010XXA Fall on same level from slipping, tripping and stumbling without subsequent striking against object, initial encounter: Secondary | ICD-10-CM | POA: Insufficient documentation

## 2015-03-12 DIAGNOSIS — S22019D Unspecified fracture of first thoracic vertebra, subsequent encounter for fracture with routine healing: Secondary | ICD-10-CM

## 2015-03-12 DIAGNOSIS — L03115 Cellulitis of right lower limb: Secondary | ICD-10-CM

## 2015-03-12 DIAGNOSIS — Y92009 Unspecified place in unspecified non-institutional (private) residence as the place of occurrence of the external cause: Secondary | ICD-10-CM

## 2015-03-12 DIAGNOSIS — Z823 Family history of stroke: Secondary | ICD-10-CM

## 2015-03-12 DIAGNOSIS — I482 Chronic atrial fibrillation: Secondary | ICD-10-CM | POA: Diagnosis present

## 2015-03-12 DIAGNOSIS — Z8249 Family history of ischemic heart disease and other diseases of the circulatory system: Secondary | ICD-10-CM

## 2015-03-12 DIAGNOSIS — S129XXA Fracture of neck, unspecified, initial encounter: Secondary | ICD-10-CM

## 2015-03-12 DIAGNOSIS — S8011XA Contusion of right lower leg, initial encounter: Secondary | ICD-10-CM | POA: Diagnosis present

## 2015-03-12 DIAGNOSIS — Z96642 Presence of left artificial hip joint: Secondary | ICD-10-CM | POA: Diagnosis present

## 2015-03-12 DIAGNOSIS — S129XXD Fracture of neck, unspecified, subsequent encounter: Secondary | ICD-10-CM

## 2015-03-12 DIAGNOSIS — S22009A Unspecified fracture of unspecified thoracic vertebra, initial encounter for closed fracture: Secondary | ICD-10-CM

## 2015-03-12 DIAGNOSIS — W19XXXD Unspecified fall, subsequent encounter: Secondary | ICD-10-CM | POA: Diagnosis present

## 2015-03-12 DIAGNOSIS — Z9181 History of falling: Secondary | ICD-10-CM

## 2015-03-12 NOTE — ED Provider Notes (Signed)
[facility] Emergency Department Provider Note  ____________________________________________  Time seen: Approximately 9:18 PM  I have reviewed the triage vital signs and the nursing notes.   HISTORY  Chief Complaint Fall    HPI [name] is a 74 y.o. female patient said she slipped and fell hitting her head on the floor did not pass out complains of some pain in the right shin as well. Patient also has some cellulitis in her legs. She says her doctor has given her a prescription for Keflex 501-4 times a day to take in case she developed some cellulitis and she had her son pull out the bottle in [place]. I advised him to start taking that now. Patient is not having a fever not having a bad headache only little bit of soreness right where she fell and hit her head. She is not dizzy vomiting or having visual changes.   Past Medical History  Diagnosis Date  . Hypertension   . Atrial fibrillation   . HLD (hyperlipidemia)   . Valvular heart disease   . Pulmonary HTN   . Lower extremity edema   . HTN (hypertension) 12/18/2014  . A-fib 12/18/2014  . Benign essential HTN 10/25/2014  . Chronic venous insufficiency 12/29/2014  . Chronic systolic heart failure 05/18/2014    Overview:  Global ef 35%   . 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 Carpal tunnel syndrome 12/30/2014  . 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 Degenerative arthritis of hip 06/08/2014  . Bilateral lower extremity edema 12/18/2014  . Cellulitis of leg, right 12/18/2014  . History of repair of hip joint 09/19/2014    Patient Active Problem List   Diagnosis Date Noted  . Carpal tunnel syndrome 12/30/2014  . 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 Chronic venous insufficiency 12/29/2014  . Breathlessness on exertion 12/29/2014  . BP (high blood pressure) 12/29/2014  . Cellulitis of leg, right 12/18/2014  . HTN (hypertension) 12/18/2014  . A-fib 12/18/2014  . Bilateral lower  extremity edema 12/18/2014  . Atrial fibrillation with RVR 12/18/2014  . HLD (hyperlipidemia) 12/18/2014  . Sepsis 12/18/2014  . Venous insufficiency of leg 11/03/2014  . Benign essential HTN 10/25/2014  . History of repair of hip joint 09/19/2014  . Acquired lymphedema of leg 09/13/2014  . Bacteremia due to Gram-positive bacteria 09/13/2014  . Combined fat and carbohydrate induced hyperlipemia 09/13/2014  . Degenerative arthritis of hip 06/08/2014  . Chronic systolic heart failure 05/18/2014  . 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 Abdominal hysterectomy    . Bilateral wrist surgery    . Left hip replacement    . Joint replacement      hip  . Cataract surgery Bilateral     Current Outpatient Rx  Name  Route  Sig  Dispense  Refill  . aspirin EC 81 MG tablet   Oral   Take by mouth.         . carvedilol (COREG) 25 MG tablet   Oral   Take by mouth.         . cyanocobalamin ([employer] VITAMIN B12) 2000 MCG tablet   Oral   Take by mouth.         . furosemide (LASIX) 80 MG tablet   Oral   Take by mouth.         . triamcinolone cream (KENALOG) 0.5 %   Topical   Apply topically.  Allergies Review of patient's allergies indicates no known allergies.  Family History  Problem Relation Age of Onset  . Heart attack Father   . Stroke Mother     Social History Social History  Substance Use Topics  . Smoking status: Former Smoker    Quit date: 04/01/1984  . Smokeless tobacco: Never Used  . Alcohol Use: No    Review of Systems Constitutional: No fever/chills Eyes: No visual changes. ENT: No sore throat. Cardiovascular: Denies chest pain. Respiratory: Denies shortness of breath. Gastrointestinal: No abdominal pain.  No nausea, no vomiting.  No diarrhea.  No constipation. Genitourinary: Negative for dysuria. Musculoskeletal: Negative for back pain. Skin: Negative for rash. Neurological:  Negative for headaches, focal weakness or numbness.  10-point ROS otherwise negative.  ____________________________________________   PHYSICAL EXAM:  VITAL SIGNS: ED Triage Vitals  Enc Vitals Group     BP 03/12/15 2045 180/112 mmHg     Pulse Rate 03/12/15 2045 96     Resp 03/12/15 2045 18     Temp 03/12/15 2045 97.7 F (36.5 C)     Temp Source 03/12/15 2045 Oral     SpO2 03/12/15 2045 96 %     Weight --      Height --      Head Cir --      Peak Flow --      Pain Score --      Pain Loc --      Pain Edu? --      Excl. in GC? --     Constitutional: Alert and oriented. Well appearing and in no acute distress. Eyes: Conjunctivae are normal. PERRL. EOMI. Head: Atraumatic except for a silver dollar size half centimeter thick lump on forehead. She reports this is where she hit her head Nose: No congestion/rhinnorhea. Mouth/Throat: Mucous membranes are moist.  Oropharynx non-erythematous. Neck: No stridor.  Cardiovascular: Normal rate, regular rhythm. Grossly normal heart sounds.  Good peripheral circulation. Respiratory: Normal respiratory effort.  No retractions. Lungs CTAB. Gastrointestinal: Soft and nontender. No distention. No abdominal bruits. No CVA tenderness. Musculoskeletal: Patient has marked edema in both lower extremities equally bilaterally. Patient also has some redness in the area and some warmth consistent with early cellulitis. Patient also has a little bit of tenderness in the lateral side of the right shin..  No joint effusions. Neurologic:  Normal speech and language. No gross focal neurologic deficits are appreciated. No gait instability. Skin:  Skin is warm, dry and intact. No rash noted. Psychiatric: Mood and affect are normal. Speech and behavior are normal.  ____________________________________________   LABS (all labs ordered are listed, but only abnormal results are displayed)  Labs Reviewed - No data to  display ____________________________________________  EKG   ____________________________________________  RADIOLOGY  CT is read as moderate chronic appearing C7 burst fracture and age indeterminate T1 fracture patient reports she fell about 3 months ago her neck is been hurting for about a week and her legs and in her a little bit weak for a month she says patient is somewhat tender over her very C-spine and upper T-spine patient said if she wanted to be transferred she would rather go to [place]. I will therefore call [facility] neurosurgery and talked to them and see what they suggest we do for this patient ----------------------------------------- 10:42 PM on 03/12/2015 -----------------------------------------  Did transfer center discusses my report with Dr. Saverio Danker AKA RIK ARI and report that he wishes to see the patient in the ER for  further evaluation and very happy with this and will begin arranging transport ____________________________________________   PROCEDURES    ____________________________________________   INITIAL IMPRESSION / ASSESSMENT AND PLAN / ED COURSE  Pertinent labs & imaging results that were available during my care of the patient were reviewed by me and considered in my medical decision making (see chart for details).  ____________________________________________   FINAL CLINICAL IMPRESSION(S) / ED DIAGNOSES  Final diagnoses:  Cervical spine fracture, initial encounter  Thoracic spine fracture, closed, initial encounter  Cellulitis of lower extremity, unspecified laterality      Arnaldo Natal, MD 03/12/15 2243

## 2015-03-12 NOTE — ED Notes (Signed)
Presents post fall from standing postion while walking with walker and slipped ans fell denies pain. Bilateral lower   Extremity cellulitis noted-denies LOC and denis syncope-admits to hitting forehead on left side. Pt does not want to be here-her daughter told her to come. Pt says she feels okay.

## 2015-03-15 ENCOUNTER — Inpatient Hospital Stay
Admission: EM | Admit: 2015-03-15 | Discharge: 2015-03-18 | DRG: 571 | Disposition: A | Discharge status: Skilled Nursing Facility | Payer: Medicare Other | Attending: Internal Medicine | Admitting: Internal Medicine

## 2015-03-15 ENCOUNTER — Encounter: Payer: Self-pay | Admitting: Emergency Medicine

## 2015-03-15 DIAGNOSIS — I10 Essential (primary) hypertension: Secondary | ICD-10-CM | POA: Diagnosis present

## 2015-03-15 DIAGNOSIS — W19XXXD Unspecified fall, subsequent encounter: Secondary | ICD-10-CM | POA: Diagnosis present

## 2015-03-15 DIAGNOSIS — L03115 Cellulitis of right lower limb: Secondary | ICD-10-CM | POA: Diagnosis present

## 2015-03-15 DIAGNOSIS — S129XXD Fracture of neck, unspecified, subsequent encounter: Secondary | ICD-10-CM | POA: Diagnosis not present

## 2015-03-15 DIAGNOSIS — S8011XA Contusion of right lower leg, initial encounter: Secondary | ICD-10-CM | POA: Diagnosis present

## 2015-03-15 DIAGNOSIS — S22019D Unspecified fracture of first thoracic vertebra, subsequent encounter for fracture with routine healing: Secondary | ICD-10-CM | POA: Diagnosis not present

## 2015-03-15 DIAGNOSIS — L8990 Pressure ulcer of unspecified site, unspecified stage: Secondary | ICD-10-CM | POA: Insufficient documentation

## 2015-03-15 DIAGNOSIS — I272 Other secondary pulmonary hypertension: Secondary | ICD-10-CM | POA: Diagnosis present

## 2015-03-15 DIAGNOSIS — I5022 Chronic systolic (congestive) heart failure: Secondary | ICD-10-CM | POA: Diagnosis present

## 2015-03-15 DIAGNOSIS — E785 Hyperlipidemia, unspecified: Secondary | ICD-10-CM | POA: Diagnosis present

## 2015-03-15 DIAGNOSIS — I96 Gangrene, not elsewhere classified: Secondary | ICD-10-CM | POA: Diagnosis present

## 2015-03-15 DIAGNOSIS — W010XXA Fall on same level from slipping, tripping and stumbling without subsequent striking against object, initial encounter: Secondary | ICD-10-CM | POA: Diagnosis present

## 2015-03-15 DIAGNOSIS — Z823 Family history of stroke: Secondary | ICD-10-CM | POA: Diagnosis not present

## 2015-03-15 DIAGNOSIS — Z8249 Family history of ischemic heart disease and other diseases of the circulatory system: Secondary | ICD-10-CM | POA: Diagnosis not present

## 2015-03-15 DIAGNOSIS — I872 Venous insufficiency (chronic) (peripheral): Secondary | ICD-10-CM | POA: Diagnosis present

## 2015-03-15 DIAGNOSIS — Y92009 Unspecified place in unspecified non-institutional (private) residence as the place of occurrence of the external cause: Secondary | ICD-10-CM | POA: Diagnosis not present

## 2015-03-15 DIAGNOSIS — Z9181 History of falling: Secondary | ICD-10-CM | POA: Diagnosis not present

## 2015-03-15 DIAGNOSIS — I482 Chronic atrial fibrillation: Secondary | ICD-10-CM | POA: Diagnosis present

## 2015-03-15 DIAGNOSIS — Z96642 Presence of left artificial hip joint: Secondary | ICD-10-CM | POA: Diagnosis present

## 2015-03-15 DIAGNOSIS — Z87891 Personal history of nicotine dependence: Secondary | ICD-10-CM | POA: Diagnosis not present

## 2015-03-15 LAB — COMPREHENSIVE METABOLIC PANEL
ALBUMIN: 3.7 g/dL (ref 3.5–5.0)
ALT: 14 U/L (ref 14–54)
AST: 23 U/L (ref 15–41)
Alkaline Phosphatase: 71 U/L (ref 38–126)
Anion gap: 9 (ref 5–15)
BILIRUBIN TOTAL: 1.1 mg/dL (ref 0.3–1.2)
BUN: 17 mg/dL (ref 6–20)
CHLORIDE: 101 mmol/L (ref 101–111)
CO2: 32 mmol/L (ref 22–32)
CREATININE: 0.76 mg/dL (ref 0.44–1.00)
Calcium: 9 mg/dL (ref 8.9–10.3)
GFR calc Af Amer: >60 mL/min (ref >60)
GLUCOSE: 105 mg/dL — AB (ref 65–99)
Potassium: 3.7 mmol/L (ref 3.5–5.1)
Sodium: 142 mmol/L (ref 135–145)
Total Protein: 6.5 g/dL (ref 6.5–8.1)

## 2015-03-15 LAB — CBC WITH DIFFERENTIAL/PLATELET
BASOS ABS: 0 10*3/uL (ref 0–0.1)
BASOS PCT: 0 %
Eosinophils Absolute: 0.1 10*3/uL (ref 0–0.7)
Eosinophils Relative: 1 %
HEMATOCRIT: 35.9 % (ref 35.0–47.0)
Hemoglobin: 11.6 g/dL — ABNORMAL LOW (ref 12.0–16.0)
LYMPHS PCT: 14 %
Lymphs Abs: 1.6 10*3/uL (ref 1.0–3.6)
MCH: 28 pg (ref 26.0–34.0)
MCHC: 32.4 g/dL (ref 32.0–36.0)
MCV: 86.4 fL (ref 80.0–100.0)
Monocytes Absolute: 0.9 10*3/uL (ref 0.2–0.9)
Monocytes Relative: 7 %
NEUTROS ABS: 9.5 10*3/uL — AB (ref 1.4–6.5)
NEUTROS PCT: 78 %
PLATELETS: 130 10*3/uL — AB (ref 150–440)
RBC: 4.15 MIL/uL (ref 3.80–5.20)
RDW: 15.4 % — ABNORMAL HIGH (ref 11.5–14.5)
WBC: 12.2 10*3/uL — AB (ref 3.6–11.0)

## 2015-03-15 LAB — APTT: APTT: 27 s (ref 24–36)

## 2015-03-15 LAB — PROTIME-INR
INR: 1.18
Prothrombin Time: 15.2 seconds — ABNORMAL HIGH (ref 11.4–15.0)

## 2015-03-15 MED ORDER — ONDANSETRON HCL 4 MG/2ML IJ SOLN
4.0000 mg | Freq: Once | INTRAMUSCULAR | Status: AC
  Administered 2015-03-15: 4 mg via INTRAVENOUS

## 2015-03-15 MED ORDER — HYDROMORPHONE HCL 1 MG/ML IJ SOLN
INTRAMUSCULAR | Status: AC
  Administered 2015-03-15: 0.5 mg via INTRAVENOUS
  Filled 2015-03-15: qty 1

## 2015-03-15 MED ORDER — ACETAMINOPHEN 650 MG RE SUPP: 650.0000 mg | Freq: Four times a day (QID) | RECTAL | Status: DC | PRN

## 2015-03-15 MED ORDER — MORPHINE SULFATE (PF) 4 MG/ML IV SOLN: 4.0000 mg | INTRAVENOUS | Status: DC | PRN

## 2015-03-15 MED ORDER — SENNA 8.6 MG PO TABS
1.0000 | ORAL_TABLET | Freq: Two times a day (BID) | ORAL | Status: DC
  Administered 2015-03-15 – 2015-03-18 (×5): 8.6 mg via ORAL
  Filled 2015-03-15 (×6): qty 1

## 2015-03-15 MED ORDER — ONDANSETRON HCL 4 MG/2ML IJ SOLN: 4.0000 mg | Freq: Four times a day (QID) | INTRAMUSCULAR | Status: DC | PRN

## 2015-03-15 MED ORDER — ALUM & MAG HYDROXIDE-SIMETH 200-200-20 MG/5ML PO SUSP
30.0000 mL | Freq: Four times a day (QID) | ORAL | Status: DC | PRN
  Administered 2015-03-18: 30 mL via ORAL
  Filled 2015-03-15: qty 30

## 2015-03-15 MED ORDER — HYDROMORPHONE HCL 1 MG/ML IJ SOLN
1.0000 mg | INTRAMUSCULAR | Status: DC | PRN
Start: 2015-03-15 — End: 2015-03-18
  Administered 2015-03-15 – 2015-03-18 (×5): 1 mg via INTRAVENOUS
  Filled 2015-03-15 (×5): qty 1

## 2015-03-15 MED ORDER — HYDROMORPHONE HCL 1 MG/ML IJ SOLN
0.5000 mg | INTRAMUSCULAR | Status: AC | PRN
  Administered 2015-03-15 (×2): 0.5 mg via INTRAVENOUS

## 2015-03-15 MED ORDER — DEXTROSE 5 % IV SOLN
1.0000 g | INTRAVENOUS | Status: DC
  Administered 2015-03-15 – 2015-03-17 (×3): 1 g via INTRAVENOUS
  Filled 2015-03-15 (×5): qty 10

## 2015-03-15 MED ORDER — SODIUM CHLORIDE 0.9 % IJ SOLN: 3.0000 mL | INTRAMUSCULAR | Status: DC | PRN

## 2015-03-15 MED ORDER — FUROSEMIDE 40 MG PO TABS
40.0000 mg | ORAL_TABLET | Freq: Every day | ORAL | Status: DC
  Administered 2015-03-16 – 2015-03-18 (×3): 40 mg via ORAL
  Filled 2015-03-15 (×3): qty 1

## 2015-03-15 MED ORDER — POLYETHYLENE GLYCOL 3350 17 G PO PACK
17.0000 g | PACK | Freq: Every day | ORAL | Status: DC | PRN
Start: 2015-03-15 — End: 2015-03-18
  Administered 2015-03-18: 17 g via ORAL
  Filled 2015-03-15: qty 1

## 2015-03-15 MED ORDER — ONDANSETRON HCL 4 MG PO TABS: 4.0000 mg | ORAL_TABLET | Freq: Four times a day (QID) | ORAL | Status: DC | PRN

## 2015-03-15 MED ORDER — ONDANSETRON HCL 4 MG/2ML IJ SOLN
INTRAMUSCULAR | Status: AC
  Administered 2015-03-15: 4 mg via INTRAVENOUS
  Filled 2015-03-15: qty 2

## 2015-03-15 MED ORDER — SODIUM CHLORIDE 0.9 % IJ SOLN
3.0000 mL | Freq: Two times a day (BID) | INTRAMUSCULAR | Status: DC
  Administered 2015-03-15 – 2015-03-18 (×6): 3 mL via INTRAVENOUS

## 2015-03-15 MED ORDER — SODIUM CHLORIDE 0.9 % IJ SOLN
3.0000 mL | Freq: Two times a day (BID) | INTRAMUSCULAR | Status: DC
  Administered 2015-03-15 – 2015-03-17 (×3): 3 mL via INTRAVENOUS

## 2015-03-15 MED ORDER — SODIUM CHLORIDE 0.9 % IV SOLN: 250.0000 mL | INTRAVENOUS | Status: DC | PRN

## 2015-03-15 MED ORDER — CARVEDILOL 25 MG PO TABS
25.0000 mg | ORAL_TABLET | Freq: Two times a day (BID) | ORAL | Status: DC
  Administered 2015-03-16 – 2015-03-18 (×5): 25 mg via ORAL
  Filled 2015-03-15 (×6): qty 1

## 2015-03-15 MED ORDER — CEFAZOLIN SODIUM 1-5 GM-% IV SOLN
1.0000 g | INTRAVENOUS | Status: AC
  Administered 2015-03-15: 1 g via INTRAVENOUS
  Filled 2015-03-15: qty 50

## 2015-03-15 MED ORDER — ACETAMINOPHEN 325 MG PO TABS
650.0000 mg | ORAL_TABLET | Freq: Four times a day (QID) | ORAL | Status: DC | PRN
  Administered 2015-03-15 – 2015-03-18 (×5): 650 mg via ORAL
  Filled 2015-03-15 (×5): qty 2

## 2015-03-15 NOTE — H&P (Signed)
[facility] [place] [facility]   PATIENT NAME: [name]    MR#:  [id number]  DATE OF BIRTH:  12/27/1940  DATE OF ADMISSION:  03/15/2015  PRIMARY CARE PHYSICIAN: Elizabeth Sauer, MD   REQUESTING/REFERRING PHYSICIAN: Dr. Carollee Massed in  CHIEF COMPLAINT:  right lower extremity swelling and redness  HISTORY OF PRESENT ILLNESS:  [name]  is a 74 y.o. female with a known history of chronic atrial fibrillation, pulmonary hypertension, hypertension, hyperlipidemia and valvular heart disease is brought into the ED with a chief complaint of right lower extremity swelling, redness and pain.  patient reports that she slipped and fell hitting her head on the floor on September 3. She denies any dizziness or passing out at that time. Patient was brought into the ED on September 3 and was evaluated by Dr. Dorothea Glassman on September 3. Patient was eventually transferred to [facility] as she has a cervical vertebral fracture and T1 compression fracture . Patient was placed on cervical collar at [facility] and she was discharged home. She was doing okay until last night and then suddenly started noticing right lower extremity swelling, hematoma associated with redness and pain . Patient was started on Keflex by her primary care physician for right lower extremity cellulitis for 5 days ago. She denies any other episodes of trauma or insect bites. ED physician Dr. Carollee Massed has discussed this with the on-call vascular surgeon Dr. Wyn Quaker, who has recommended to admit the patient for close monitoring , doesn't think she needs any surgical interventions at this time.   PAST MEDICAL HISTORY:   Past Medical History  Diagnosis Date  . Hypertension   . Atrial fibrillation   . HLD (hyperlipidemia)   . Valvular heart disease   . Pulmonary HTN   . Lower extremity edema   . HTN (hypertension) 12/18/2014  . A-fib 12/18/2014  . Benign essential HTN 10/25/2014  . Chronic venous  insufficiency 12/29/2014  . Chronic systolic heart failure 05/18/2014    Overview:  Global ef 35%   . 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 Carpal tunnel syndrome 12/30/2014  . 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 Degenerative arthritis of hip 06/08/2014  . Bilateral lower extremity edema 12/18/2014  . Cellulitis of leg, right 12/18/2014  . History of repair of hip joint 09/19/2014    PAST SURGICAL HISTOIRY:   Past Surgical History  Procedure Laterality Date  . Abdominal hysterectomy    . Bilateral wrist surgery    . Left hip replacement    . Joint replacement      hip  . Cataract surgery Bilateral     SOCIAL HISTORY:   Social History  Substance Use Topics  . Smoking status: Former Smoker    Quit date: 04/01/1984  . Smokeless tobacco: Never Used  . Alcohol Use: No    FAMILY HISTORY:   Family History  Problem Relation Age of Onset  . Heart attack Father   . Stroke Mother     DRUG ALLERGIES:  No Known Allergies  REVIEW OF SYSTEMS:  Has chronic history of atrial fibrillation and congestive heart failure CONSTITUTIONAL: No fever, fatigue or weakness.  EYES: No blurred or double vision.  EARS, NOSE, AND THROAT: No tinnitus or ear pain.  RESPIRATORY: No cough, shortness of breath, wheezing or hemoptysis.  CARDIOVASCULAR: No chest pain, orthopnea, edema. patient denies any history of palpitations   GASTROINTESTINAL: No nausea, vomiting, diarrhea or abdominal pain.  GENITOURINARY: No dysuria, hematuria.  ENDOCRINE:  No polyuria, nocturia,  HEMATOLOGY: No anemia, easy bruising or bleeding SKIN: No rash or lesion. MUSCULOSKELETAL: No joint pain or arthritis.   NEUROLOGIC: No tingling, numbness, weakness.  PSYCHIATRY: No anxiety or depression.   MEDICATIONS AT HOME:   Prior to Admission medications   Medication Sig Start Date End Date Taking? Authorizing Provider  aspirin EC 81 MG tablet Take by mouth.   Yes Historical Provider, MD  carvedilol (COREG) 25 MG  tablet Take 25 mg by mouth 2 (two) times daily with a meal.  09/22/14  Yes Historical Provider, MD  cephALEXin (KEFLEX) 500 MG capsule Take 500 mg by mouth 4 (four) times daily. Only as needed.   Yes Historical Provider, MD  furosemide (LASIX) 80 MG tablet Take 40 mg by mouth daily.  09/22/14  Yes Historical Provider, MD      VITAL SIGNS:  Blood pressure 163/92, pulse 115, temperature 98 F (36.7 C), temperature source Oral, resp. rate 21, height  (1.702 m), weight 96.163 kg (212 lb), SpO2 95 %.  PHYSICAL EXAMINATION:  GENERAL:  74 y.o.-year-old patient lying in the bed with no acute distress.  EYES: Pupils equal, round, reactive to light and accommodation. No scleral icterus. Extraocular muscles intact.  HEENT: Head atraumatic, normocephalic. Oropharynx and nasopharynx clear.  NECK:  Supple, no jugular venous distention. No thyroid enlargement, no tenderness.  LUNGS: Normal breath sounds bilaterally, no wheezing, rales,rhonchi or crepitation. No use of accessory muscles of respiration.  CARDIOVASCULAR: irregularly irregular  No murmurs, rubs, or gallops.  ABDOMEN: Soft, nontender, nondistended. Bowel sounds present. No organomegaly or mass.  EXTREMITIES: Right lower extremity , on the lateral and dorsal aspect of the lower leg with a 23 x 17 x 4 cm hematoma with superficial blebs and dark discoloration , surrounded by erythema and inflammation .serous colored fluid is seeping from the hematoma and very tender to touch . Peripheral pulses including dorsalis pedis and posterior tibialis are 2+ at this time. Neurovascular is intact.  no cyanosis, or clubbing.  NEUROLOGIC: Cranial nerves II through XII are intact.  patient is.  hard of hearingMuscle strength 5/5 in all extremities. Sensation intact. Gait not checked.  PSYCHIATRIC: The patient is alert and oriented x 3.  SKIN: No obvious rash or ulcer.   LABORATORY PANEL:   CBC  Recent Labs Lab 03/15/15 1001  WBC 12.2*  HGB 11.6*   HCT 35.9  PLT 130*   ------------------------------------------------------------------------------------------------------------------  Chemistries   Recent Labs Lab 03/15/15 1001  NA 142  K 3.7  CL 101  CO2 32  GLUCOSE 105*  BUN 17  CREATININE 0.76  CALCIUM 9.0  AST 23  ALT 14  ALKPHOS 71  BILITOT 1.1   ------------------------------------------------------------------------------------------------------------------  Cardiac Enzymes No results for input(s): TROPONINI in the last 168 hours. ------------------------------------------------------------------------------------------------------------------  RADIOLOGY:  No results found.  EKG:   Orders placed or performed during the hospital encounter of 12/18/14  . EKG 12-Lead  . EKG 12-Lead  . EKG    IMPRESSION AND PLAN:   Ms. Gates came into the ED with a chief complaint of right lower extremity swelling, redness and pain.  patient reports that she slipped and fell hitting her head on the floor on September 3. She denies any dizziness or passing out at that time.   1. Right lower extremity hematoma with cellulitis:  Will provide IV Rocephin Consult is placed to vascular surgery Dr. Kizzie Fantasia Wound care consult is placed Pain management with IV morphine and Dilaudid as needed basis  Providing bowel regimen Will admit the patient to MedSurg unit for close monitoring for the development of compartment syndrome /necrosis Neurovascular checks every 4 hours Will monitor hemoglobin and hematocrit and give blood a transfusion as needed basis.  2.chronic history of atrial fibrillation -rate controlled   continue Coreg Will hold baby aspirin at this time in view of hematoma  3. Chronic history of pulmonary hypertension Continue her home medications Lasix   4. Chronic history of hypertension Blood pressure is stable at this time will continue her home medication Coreg as well as Lasix  5. Recent history of cervical  vertebral fracture and T1 compression fracture Continue cervical collar as recommended by [facility] hospital for a total of 2 weeks Pain management as needed  6. Left thyroid nodule per CT scan on September 3 Outpatient thyroid ultrasound is recommended   7. DVT prophylaxis with teds  All the records are reviewed and case discussed with ED provider. Management plans discussed with the patient, and her son at bedside  and they are in agreement.  CODE STATUS: Full code, son is the healthcare power of attorney   TOTAL TIME TAKING CARE OF THIS PATIENT: reviewing medical records, current labs and imaging studies, history and physical, discussion with the ED physician, patient and son and coordination of care-45 minutes.    Ramonita Lab M.D on 03/15/2015 at 11:11 AM  Between 7am to 6pm - Pager - [phone]  After 6pm go to [email or web address] - password [email or web address] [facility]  [place]  Office  [phone]  CC: Primary care physician; Elizabeth Sauer, MD

## 2015-03-15 NOTE — ED Notes (Signed)
Dilauded given, pt resting, sats dropping to upper 80's while asleep, 2L per [place] oxygen put on pt.

## 2015-03-15 NOTE — ED Notes (Signed)
Pedal pulse marked in right foot, doppler strong .

## 2015-03-15 NOTE — Progress Notes (Signed)
patient with a large hematoma and necrotic skin which will need to be evacuated   Will plan OR tomorrow  Given recent C-spine injury I will ask anesthesia to she her today  Full consult to follow

## 2015-03-15 NOTE — ED Provider Notes (Signed)
[facility] Emergency Department Provider Note  ____________________________________________  Time seen: 9:30  I have reviewed the triage vital signs and the nursing notes.   HISTORY  Chief Complaint Leg Swelling and Leg Pain     HPI [name] is a 74 y.o. female who experienced a fall on Saturday night. At that time, she injured her right leg as well as experienced a fracture to her cervical spine. She was initially seen at [facility], and transfer to [facility] She was released next morning with a cervical collar in place.  She returns to emergency department this morning due to pain in her right leg and a very large swollen area which is clearly a hematoma. The son reports that this area was very small earlier this morning and then enlarged fairly rapidly. The patient is in moderate discomfort.  The patient does take an aspirin each morning. She has not taken it today. She does not take any anticoagulants, despite having atrial fibrillation.  She took 2 of her regular medicines this morning but did not take her blood pressure medicine, carvedilol, which also is likely her primary source for rate control. She is currently a little bit tachycardic without palpitations. Heart rate in the 110s.    Past Medical History  Diagnosis Date  . Hypertension   . Atrial fibrillation   . HLD (hyperlipidemia)   . Valvular heart disease   . Pulmonary HTN   . Lower extremity edema   . HTN (hypertension) 12/18/2014  . A-fib 12/18/2014  . Benign essential HTN 10/25/2014  . Chronic venous insufficiency 12/29/2014  . Chronic systolic heart failure 05/18/2014    Overview:  Global ef 35%   . 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 Carpal tunnel syndrome 12/30/2014  . 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 Degenerative arthritis of hip 06/08/2014  . Bilateral lower extremity edema 12/18/2014  . Cellulitis of leg, right 12/18/2014  . History of  repair of hip joint 09/19/2014    Patient Active Problem List   Diagnosis Date Noted  . Hematoma of right lower extremity 03/15/2015  . Carpal tunnel syndrome 12/30/2014  . 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 Chronic venous insufficiency 12/29/2014  . Breathlessness on exertion 12/29/2014  . BP (high blood pressure) 12/29/2014  . Cellulitis of leg, right 12/18/2014  . HTN (hypertension) 12/18/2014  . A-fib 12/18/2014  . Bilateral lower extremity edema 12/18/2014  . Atrial fibrillation with RVR 12/18/2014  . HLD (hyperlipidemia) 12/18/2014  . Sepsis 12/18/2014  . Venous insufficiency of leg 11/03/2014  . Benign essential HTN 10/25/2014  . History of repair of hip joint 09/19/2014  . Acquired lymphedema of leg 09/13/2014  . Bacteremia due to Gram-positive bacteria 09/13/2014  . Combined fat and carbohydrate induced hyperlipemia 09/13/2014  . Degenerative arthritis of hip 06/08/2014  . Chronic systolic heart failure 05/18/2014  . 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 Abdominal hysterectomy    . Bilateral wrist surgery    . Left hip replacement    . Joint replacement      hip  . Cataract surgery Bilateral     No current outpatient prescriptions on file.  Allergies Review of patient's allergies indicates no known allergies.  Family History  Problem Relation Age of Onset  . Heart attack Father   . Stroke Mother     Social History Social History  Substance Use Topics  . Smoking status: Former Smoker  Quit date: 04/01/1984  . Smokeless tobacco: Never Used  . Alcohol Use: No    Review of Systems  Constitutional: Negative for fever. ENT: Negative for sore throat. Cardiovascular: Negative for chest pain. Respiratory: Negative for shortness of breath. Gastrointestinal: Negative for abdominal pain, vomiting and diarrhea. Genitourinary: Negative for dysuria. Musculoskeletal: Notable recent cervical  spine fracture. Patient wearing c-collar. Large hematoma to right lower leg. See history of present illness Skin: Large hematoma, right leg Neurological: Negative for headaches   10-point ROS otherwise negative.  ____________________________________________   PHYSICAL EXAM:  VITAL SIGNS: ED Triage Vitals  Enc Vitals Group     BP 03/15/15 0909 163/92 mmHg     Pulse Rate 03/15/15 0909 115     Resp 03/15/15 0909 21     Temp 03/15/15 0909 98 F (36.7 C)     Temp Source 03/15/15 0909 Oral     SpO2 03/15/15 0909 95 %     Weight 03/15/15 0909 212 lb (96.163 kg)     Height 03/15/15 0909 5\' 7"  (1.702 m)     Head Cir --      Peak Flow --      Pain Score 03/15/15 0910 8     Pain Loc --      Pain Edu? --      Excl. in GC? --     Constitutional:  Alert and oriented. Moderately large body habitus and somewhat deconditioned. She is in mild to moderate discomfort without distress. ENT   Head: Normocephalic and atraumatic.   Nose: No congestion/rhinnorhea. Cardiovascular: Tachycardic with an irregular rhythm. Atrial fibrillation. Respiratory:  Normal respiratory effort, no tachypnea.    Breath sounds are clear and equal bilaterally.  Gastrointestinal: Soft and nontender. No distention.  Back: No muscle spasm, no tenderness, no CVA tenderness. Musculoskeletal: C-collar in place on cervical spine. No deformity to the extremities except for the notable and large hematoma on the anterior lateral right lower leg. This measures approximately 25 cm x 12 cm.  Neurologic:  Normal speech and language. No gross focal neurologic deficits are appreciated.  Skin:  Skin is warm, dry. No rash noted. Psychiatric: Mood and affect are normal. Speech and behavior are normal.  ____________________________________________    LABS (pertinent positives/negatives)  Labs Reviewed  PROTIME-INR - Abnormal; Notable for the following:    Prothrombin Time 15.2 (*)    All other components within normal  limits  CBC WITH DIFFERENTIAL/PLATELET - Abnormal; Notable for the following:    WBC 12.2 (*)    Hemoglobin 11.6 (*)    RDW 15.4 (*)    Platelets 130 (*)    Neutro Abs 9.5 (*)    All other components within normal limits  COMPREHENSIVE METABOLIC PANEL - Abnormal; Notable for the following:    Glucose, Bld 105 (*)    All other components within normal limits  APTT     ____________________________________________  ____________________________________________   INITIAL IMPRESSION / ASSESSMENT AND PLAN / ED COURSE  Pertinent labs & imaging results that were available during my care of the patient were reviewed by me and considered in my medical decision making (see chart for details).  Large hematoma in the right leg. This is clearly temp knotted off, but is uncomfortable for the patient. We will treat her with some pain medication and check her quite relation studies. The patient is having difficulty ambulating and given her total constellation of dysfunction and pain, we will consider admitting her to the hospital for observation and  then placement for further support.  I discussed the case with both vascular surgery and with the hospitalist service.  ____________________________________________   FINAL CLINICAL IMPRESSION(S) / ED DIAGNOSES  Final diagnoses:  Traumatic hematoma of right lower leg, initial encounter      Darien Ramus, MD 03/15/15 1547

## 2015-03-15 NOTE — ED Notes (Signed)
Pt fell and was seen here on Sat night. This am, pt woke up with large hematoma to right lower extremity, skin weeping at site. BLE ongoing per pts son. Pt appears in pain.

## 2015-03-16 ENCOUNTER — Encounter: Payer: Self-pay | Admitting: Anesthesiology

## 2015-03-16 ENCOUNTER — Inpatient Hospital Stay: Payer: Medicare Other | Admitting: Anesthesiology

## 2015-03-16 ENCOUNTER — Encounter
Admission: EM | Disposition: A | Discharge status: Skilled Nursing Facility | Payer: Self-pay | Source: Home / Self Care | Attending: Internal Medicine

## 2015-03-16 DIAGNOSIS — L8990 Pressure ulcer of unspecified site, unspecified stage: Secondary | ICD-10-CM | POA: Insufficient documentation

## 2015-03-16 HISTORY — PX: MINOR APPLICATION OF WOUND VAC: SHX6243

## 2015-03-16 HISTORY — PX: BEDSIDE EVACUATION OF HEMATOMA: SHX6535

## 2015-03-16 LAB — COMPREHENSIVE METABOLIC PANEL
ALT: 21 U/L (ref 14–54)
AST: 76 U/L — ABNORMAL HIGH (ref 15–41)
Albumin: 3.1 g/dL — ABNORMAL LOW (ref 3.5–5.0)
Alkaline Phosphatase: 51 U/L (ref 38–126)
Anion gap: 4 — ABNORMAL LOW (ref 5–15)
BILIRUBIN TOTAL: 0.5 mg/dL (ref 0.3–1.2)
BUN: 15 mg/dL (ref 6–20)
CHLORIDE: 99 mmol/L — AB (ref 101–111)
CO2: 35 mmol/L — ABNORMAL HIGH (ref 22–32)
CREATININE: 0.68 mg/dL (ref 0.44–1.00)
Calcium: 8.4 mg/dL — ABNORMAL LOW (ref 8.9–10.3)
Glucose, Bld: 110 mg/dL — ABNORMAL HIGH (ref 65–99)
Potassium: 3.8 mmol/L (ref 3.5–5.1)
Sodium: 138 mmol/L (ref 135–145)
TOTAL PROTEIN: 5.8 g/dL — AB (ref 6.5–8.1)

## 2015-03-16 LAB — CBC
HCT: 28.1 % — ABNORMAL LOW (ref 35.0–47.0)
Hemoglobin: 9.3 g/dL — ABNORMAL LOW (ref 12.0–16.0)
MCH: 29.1 pg (ref 26.0–34.0)
MCHC: 32.9 g/dL (ref 32.0–36.0)
MCV: 88.2 fL (ref 80.0–100.0)
PLATELETS: 104 10*3/uL — AB (ref 150–440)
RBC: 3.19 MIL/uL — AB (ref 3.80–5.20)
RDW: 15.5 % — AB (ref 11.5–14.5)
WBC: 7.6 10*3/uL (ref 3.6–11.0)

## 2015-03-16 LAB — PROTIME-INR
INR: 1.28
PROTHROMBIN TIME: 16.2 s — AB (ref 11.4–15.0)

## 2015-03-16 SURGERY — BEDSIDE EVACUATION OF HEMATOMA
Anesthesia: General | Site: Leg Lower | Laterality: Right | Wound class: Clean

## 2015-03-16 MED ORDER — ETOMIDATE 2 MG/ML IV SOLN
INTRAVENOUS | Status: DC | PRN
  Administered 2015-03-16: 10 mg via INTRAVENOUS

## 2015-03-16 MED ORDER — PHENYLEPHRINE HCL 10 MG/ML IJ SOLN
INTRAMUSCULAR | Status: DC | PRN
  Administered 2015-03-16 (×7): 100 ug via INTRAVENOUS

## 2015-03-16 MED ORDER — MIDAZOLAM HCL 2 MG/2ML IJ SOLN
INTRAMUSCULAR | Status: DC | PRN
  Administered 2015-03-16: 2 mg via INTRAVENOUS

## 2015-03-16 MED ORDER — NYSTATIN 100000 UNIT/GM EX POWD
Freq: Three times a day (TID) | CUTANEOUS | Status: DC
  Administered 2015-03-16 – 2015-03-18 (×6): via TOPICAL
  Filled 2015-03-16: qty 15

## 2015-03-16 MED ORDER — PROPOFOL 10 MG/ML IV BOLUS
INTRAVENOUS | Status: DC | PRN
  Administered 2015-03-16: 30 mg via INTRAVENOUS

## 2015-03-16 MED ORDER — LACTATED RINGERS IV SOLN
INTRAVENOUS | Status: DC | PRN
  Administered 2015-03-16: 14:00:00 via INTRAVENOUS

## 2015-03-16 MED ORDER — OXYCODONE HCL 5 MG/5ML PO SOLN: 5.0000 mg | Freq: Once | ORAL | Status: DC | PRN

## 2015-03-16 MED ORDER — FENTANYL CITRATE (PF) 100 MCG/2ML IJ SOLN
25.0000 ug | INTRAMUSCULAR | Status: DC | PRN
  Administered 2015-03-16 (×2): 25 ug via INTRAVENOUS
  Administered 2015-03-16: 50 ug via INTRAVENOUS

## 2015-03-16 MED ORDER — EPHEDRINE SULFATE 50 MG/ML IJ SOLN
INTRAMUSCULAR | Status: DC | PRN
  Administered 2015-03-16 (×3): 5 mg via INTRAVENOUS

## 2015-03-16 MED ORDER — OXYCODONE HCL 5 MG PO TABS: 5.0000 mg | ORAL_TABLET | Freq: Once | ORAL | Status: DC | PRN

## 2015-03-16 SURGICAL SUPPLY — 30 items
BNDG COHESIVE 6X5 TAN STRL LF (GAUZE/BANDAGES/DRESSINGS) ×4 IMPLANT
BRUSH SCRUB 4% CHG (MISCELLANEOUS) IMPLANT
CANISTER SUCT 1200ML W/VALVE (MISCELLANEOUS) IMPLANT
CANISTER SUCT 3000ML (MISCELLANEOUS) ×4 IMPLANT
CATH FOL LEG HOLDER (MISCELLANEOUS) ×4 IMPLANT
DRSG VAC ATS LRG SENSATRAC (GAUZE/BANDAGES/DRESSINGS) ×4 IMPLANT
DURAPREP 26ML APPLICATOR (WOUND CARE) IMPLANT
GAUZE SPONGE 4X4 12PLY STRL (GAUZE/BANDAGES/DRESSINGS) IMPLANT
GAUZE STRETCH 2X75IN STRL (MISCELLANEOUS) IMPLANT
GLOVE SURG SYN 8.0 (GLOVE) ×20 IMPLANT
GOWN STRL REUS W/ TWL LRG LVL3 (GOWN DISPOSABLE) ×4 IMPLANT
GOWN STRL REUS W/ TWL XL LVL3 (GOWN DISPOSABLE) ×2 IMPLANT
GOWN STRL REUS W/TWL LRG LVL3 (GOWN DISPOSABLE) ×4
GOWN STRL REUS W/TWL XL LVL3 (GOWN DISPOSABLE) ×2
HANDLE YANKAUER SUCT BULB TIP (MISCELLANEOUS) ×4 IMPLANT
KIT RM TURNOVER STRD PROC AR (KITS) ×4 IMPLANT
LABEL OR SOLS (LABEL) IMPLANT
NS IRRIG 500ML POUR BTL (IV SOLUTION) ×4 IMPLANT
PACK EXTREMITY ARMC (MISCELLANEOUS) ×4 IMPLANT
PAD GROUND ADULT SPLIT (MISCELLANEOUS) ×4 IMPLANT
PAD NEG PRESSURE SENSATRAC (MISCELLANEOUS) IMPLANT
PREP PVP WINGED SPONGE (MISCELLANEOUS) ×4 IMPLANT
SOL PREP PVP 2OZ (MISCELLANEOUS) ×4
SOLUTION PREP PVP 2OZ (MISCELLANEOUS) ×2 IMPLANT
SPONGE LAP 18X18 5 PK (GAUZE/BANDAGES/DRESSINGS) ×8 IMPLANT
STOCKINETTE IMPERV 14X48 (MISCELLANEOUS) ×4 IMPLANT
STOCKINETTE STRL 6IN 960660 (GAUZE/BANDAGES/DRESSINGS) IMPLANT
SWAB DUAL CULTURE TRANS RED ST (MISCELLANEOUS) ×4 IMPLANT
TRAY FOLEY W/METER SILVER 16FR (SET/KITS/TRAYS/PACK) ×4 IMPLANT
WND VAC CANISTER 500ML (MISCELLANEOUS) ×4 IMPLANT

## 2015-03-16 NOTE — Progress Notes (Signed)
Pt appears to have yeast in buttock folds causing excoriation. Will notify md.

## 2015-03-16 NOTE — Consult Note (Signed)
WOC wound consult note Reason for Consult: Asked to evaluate hematoma to right anterior lower leg. Patient is scheduled for a surgical evacuation today.  Will defer to surgical plan of care and will not follow.  Wound type:Trauma sustained from fall/cellulitis Pressure Ulcer POA: N/A Will not follow at this time.  Please re-consult if needed.  Maple Hudson RN BSN CWON Pager [phone]

## 2015-03-16 NOTE — Consult Note (Signed)
[facility] Vascular Consult Note  MRN : [id number]  [name] is a 74 y.o. (1940-07-18) female who presents with chief complaint of  Chief Complaint  Patient presents with  . Leg Swelling  . Leg Pain  .  History of Present Illness:  The patient is a 74 y.o. female with a known history of chronic atrial fibrillation, pulmonary hypertension, hypertension, hyperlipidemia and valvular heart disease is brought into the ED with a chief complaint of right lower extremity swelling, redness and pain. patient reports that she slipped and fell hitting her head on the floor on September 3. She denies any dizziness or passing out at that time. Patient was brought into the ED on September 3 and was evaluated by Dr. Dorothea Glassman. Patient was  transferred to [facility] as she has a cervical vertebral fracture and T1 compression fracture . Patient was placed on cervical collar at [facility] and she was discharged home. She was doing okay until last night and then suddenly started noticing right lower extremity swelling, and black and blue discoloration of the lateral calf associated with redness of the surrounding area and pain. Patient was started on Keflex by her primary care physician for right lower extremity cellulitis for 5 days ago. She denies any other episodes of trauma or insect bites.  She does relate that she fell a second time since she's been at home and this is likely the trauma then initiated the hematoma not the injury from September 3.  Current Facility-Administered Medications  Medication Dose Route Frequency Provider Last Rate Last Dose  . 0.9 %  sodium chloride infusion  250 mL Intravenous PRN Ramonita Lab, MD      . acetaminophen (TYLENOL) tablet 650 mg  650 mg Oral Q6H PRN Ramonita Lab, MD   650 mg at 03/15/15 1539   Or  . acetaminophen (TYLENOL) suppository 650 mg  650 mg Rectal Q6H PRN Ramonita Lab, MD      . alum & mag hydroxide-simeth (MAALOX/MYLANTA)  200-200-20 MG/5ML suspension 30 mL  30 mL Oral Q6H PRN Ramonita Lab, MD      . carvedilol (COREG) tablet 25 mg  25 mg Oral BID WC Ramonita Lab, MD   25 mg at 03/15/15 1700  . cefTRIAXone (ROCEPHIN) 1 g in dextrose 5 % 50 mL IVPB  1 g Intravenous Q24H Aruna Gouru, MD 100 mL/hr at 03/15/15 1147 1 g at 03/15/15 1147  . furosemide (LASIX) tablet 40 mg  40 mg Oral Daily Ramonita Lab, MD   40 mg at 03/15/15 1230  . HYDROmorphone (DILAUDID) injection 1 mg  1 mg Intravenous Q3H PRN Ramonita Lab, MD   1 mg at 03/15/15 1858  . morphine 4 MG/ML injection 4 mg  4 mg Intravenous Q3H PRN Ramonita Lab, MD      . ondansetron (ZOFRAN) tablet 4 mg  4 mg Oral Q6H PRN Ramonita Lab, MD       Or  . ondansetron (ZOFRAN) injection 4 mg  4 mg Intravenous Q6H PRN Aruna Gouru, MD      . polyethylene glycol (MIRALAX / GLYCOLAX) packet 17 g  17 g Oral Daily PRN Ramonita Lab, MD      . senna (SENOKOT) tablet 8.6 mg  1 tablet Oral BID Ramonita Lab, MD   8.6 mg at 03/15/15 2240  . sodium chloride 0.9 % injection 3 mL  3 mL Intravenous Q12H Ramonita Lab, MD   3 mL at 03/15/15 2241  .  sodium chloride 0.9 % injection 3 mL  3 mL Intravenous Q12H Ramonita Lab, MD   3 mL at 03/15/15 2240  . sodium chloride 0.9 % injection 3 mL  3 mL Intravenous PRN Ramonita Lab, MD        Past Medical History  Diagnosis Date  . Hypertension   . Atrial fibrillation   . HLD (hyperlipidemia)   . Valvular heart disease   . Pulmonary HTN   . Lower extremity edema   . HTN (hypertension) 12/18/2014  . A-fib 12/18/2014  . Benign essential HTN 10/25/2014  . Chronic venous insufficiency 12/29/2014  . Chronic systolic heart failure 05/18/2014    Overview:  Global ef 35%   . 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 Carpal tunnel syndrome 12/30/2014  . 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 Degenerative arthritis of hip 06/08/2014  . Bilateral lower extremity edema 12/18/2014  . Cellulitis of leg, right 12/18/2014  . History of repair of hip joint 09/19/2014     Past Surgical History  Procedure Laterality Date  . Abdominal hysterectomy    . Bilateral wrist surgery    . Left hip replacement    . Joint replacement      hip  . Cataract surgery Bilateral     Social History Social History  Substance Use Topics  . Smoking status: Former Smoker    Quit date: 04/01/1984  . Smokeless tobacco: Never Used  . Alcohol Use: No    Family History Family History  Problem Relation Age of Onset  . Heart attack Father   . Stroke Mother    no family history of porphyria, autoimmune disease or bleeding/clotting disorders  No Known Allergies   REVIEW OF SYSTEMS (Negative unless checked)  Constitutional: [] Weight loss  [] Fever  [] Chills Cardiac: [] Chest pain   [] Chest pressure   [] Palpitations   [] Shortness of breath when laying flat   [] Shortness of breath at rest   [] Shortness of breath with exertion. Vascular:  [] Pain in legs with walking   [] Pain in legs at rest   [] Pain in legs when laying flat   [] Claudication   [] Pain in feet when walking  [x] Pain in feet at rest  [] Pain in feet when laying flat   [] History of DVT   [] Phlebitis   [x] Swelling in legs   [] Varicose veins   [] Non-healing ulcers Pulmonary:   [] Uses home oxygen   [] Productive cough   [] Hemoptysis   [] Wheeze  [] COPD   [] Asthma Neurologic:  [] Dizziness  [] Blackouts   [] Seizures   [] History of stroke   [] History of TIA  [] Aphasia   [] Temporary blindness   [] Dysphagia   [] Weakness or numbness in arms   [] Weakness or numbness in legs Musculoskeletal:  [x] Arthritis   [] Joint swelling   [] Joint pain   [] Low back pain Hematologic:  [x] Easy bruising  [] Easy bleeding   [] Hypercoagulable state   [] Anemic  [] Hepatitis Gastrointestinal:  [] Blood in stool   [] Vomiting blood  [] Gastroesophageal reflux/heartburn   [] Difficulty swallowing. Genitourinary:  [] Chronic kidney disease   [] Difficult urination  [] Frequent urination  [] Burning with urination   [] Blood in urine Skin:  [] Rashes   [] Ulcers    [x] Wounds Psychological:  [] History of anxiety   []  History of major depression.   Physical Examination  Filed Vitals:   03/15/15 1758 03/15/15 2009 03/16/15 0005 03/16/15 0413  BP: 88/50 107/57 103/57 105/51  Pulse: 81 91 101 127  Temp:  97.7 F (36.5 C) 97.8 F (36.6 C) 98.1 F (36.7 C)  TempSrc:  Oral Oral Oral  Resp:  18 18 20   Height:      Weight:      SpO2:  97% 95% 91%   Body mass index is 33.2 kg/(m^2).  Head: [place]/AT, No temporalis wasting. Prominent temp pulse not noted. Ear/Nose/Throat: Nares w/o erythema or drainage, oropharynx w/o obsrtuction, Mallampati score: Class IV.  Dentition poor  Eyes: PERRLA, Sclera nonicteric.  Neck: Supple, no nuchal rigidity.  No bruit or JVD.  Pulmonary:  Breath sounds equal bilaterally, no use of accessory muscles.  Cardiac: RRR, normal S1, S2, no Murmurs, rubs or gallops. Vascular: Large hematoma right lateral calf with necrotic changes to significant area of skin and blistering. Skin changes surrounding this area consistent with cellulitis. In general there are changes consistent with chronic lymphedema bilaterally. Toes are pink and warm bilaterally with brisk capillary refill but there are no palpable pedal pulses Gastrointestinal: soft, non-tender, non-distended.  Musculoskeletal: Moves all extremities.  Marked arthritic deformity or atrophy. 2+ edema bilaterally. Neurologic: CN 2-12 intact. Symmetrical.  Speech is fluent. C-collar is present  Psychiatric: Judgment intact, Mood & affect appropriate for pt's clinical situation. Dermatologic: No rashes or ulcers noted.  No cellulitis or open wounds. Lymph : No Cervical,  or Inguinal lymphadenopathy.      CBC Lab Results  Component Value Date   WBC 7.6 03/16/2015   HGB 9.3* 03/16/2015   HCT 28.1* 03/16/2015   MCV 88.2 03/16/2015   PLT 104* 03/16/2015    BMET    Component Value Date/Time   NA 138 03/16/2015 0620   NA 134* 07/13/2014 0724   K 3.8 03/16/2015 0620   K 3.7  07/13/2014 0724   CL 99* 03/16/2015 0620   CL 94* 07/13/2014 0724   CO2 35* 03/16/2015 0620   CO2 34* 07/13/2014 0724   GLUCOSE 110* 03/16/2015 0620   GLUCOSE 86 07/13/2014 0724   BUN 15 03/16/2015 0620   BUN 11 07/13/2014 0724   CREATININE 0.68 03/16/2015 0620   CREATININE 0.86 07/13/2014 0724   CALCIUM 8.4* 03/16/2015 0620   CALCIUM 9.1 07/13/2014 0724   GFRNONAA >60 03/16/2015 0620   GFRNONAA >60 07/13/2014 0724   GFRAA >60 03/16/2015 0620   GFRAA >60 07/13/2014 0724   Estimated Creatinine Clearance: 73.4 mL/min (by C-G formula based on Cr of 0.68).  COAG Lab Results  Component Value Date   INR 1.28 03/16/2015   INR 1.18 03/15/2015   INR 1.1 06/08/2014    Assessment/Plan 1. Right lower extremity hematoma with cellulitis and gangrenous changes of the skin:  Currently the patient is on IV Rocephin appropriately so. There is significant skin necrosis associated with this hematoma and therefore will require evacuation and arrangements will be made for surgical debridement. This will be complicated even her recent C-spine injury. Typically situations like this did not respond well to local infiltration and therefore I will ask anesthesia to see the patient prior to the surgery to allow them time to formulate a plan. The risks and benefits as well as the overall plan for wound care are reviewed patient's daughter and grandson are present all questions are answered patient has agreed to proceed Pain management with IV morphine and Dilaudid as needed basis Providing bowel regimen Neurovascular checks every 4 hours Will monitor hemoglobin and hematocrit and give blood a transfusion as needed basis.  2.chronic history of atrial fibrillation -rate controlled  continue Coreg Will hold baby aspirin at this time in view of hematoma  3. Chronic history  of pulmonary hypertension Continue her home medications Lasix   4. Chronic history of hypertension Blood pressure is stable at  this time will continue her home medication Coreg as well as Lasix  5. Recent history of cervical vertebral fracture and T1 compression fracture Continue cervical collar as recommended by [facility] for a total of 2 weeks Pain management as needed  6. Left thyroid nodule per CT scan on September 3 Outpatient thyroid ultrasound is recommended    Malanie Koloski, Latina Craver, MD  03/16/2015 7:36 AM

## 2015-03-16 NOTE — Care Management Note (Signed)
Case Management Note  Patient Details  Name: [name] MRN: [id number] Date of Birth: [birth year]  Subjective/Objective:                  Met with patient to discuss discharge planning. She wants to go to [facility] but states her son [name] works at [employer] and wants her at [facility]- patient indicated that she does not want to go to [employer]- "I'd rather go to [place] since I've been there before". She states she was walking with a cane but recently needed a walker. She states she is from home with her daughter [name]. Her PCP is Dr. Otilio Miu. O2 is new. She has used Home Health in the past but doesn't remember name of agency. She uses [facility] on [address] For Rx.   Action/Plan: List of home health agencies left with patient. RNCM will continue to follow. [name] CSW updated on patient request.   Expected Discharge Date:                  Expected Discharge Plan:     In-House Referral:  Clinical Social Work  Discharge planning Services  CM Consult  Post Acute Care Choice:  Home Health, Durable Medical Equipment Choice offered to:  Patient  DME Arranged:    DME Agency:     HH Arranged:    [place] Agency:     Status of Service:  In process, will continue to follow  Medicare Important Message Given:    Date Medicare IM Given:    Medicare IM give by:    Date Additional Medicare IM Given:    Additional Medicare Important Message give by:     If discussed at [place] of Stay Meetings, dates discussed:    Additional Comments:  Marshell Garfinkel, RN 03/16/2015, 10:40 AM

## 2015-03-16 NOTE — Anesthesia Procedure Notes (Signed)
Procedure Name: LMA Insertion Date/Time: 03/16/2015 2:22 PM Performed by: Junious Silk Pre-anesthesia Checklist: Patient identified, Patient being monitored, Timeout performed, Emergency Drugs available and Suction available Patient Re-evaluated:Patient Re-evaluated prior to inductionOxygen Delivery Method: Circle system utilized Preoxygenation: Pre-oxygenation with 100% oxygen Intubation Type: IV induction Ventilation: Mask ventilation without difficulty LMA: LMA inserted LMA Size: 3.5 Tube type: Oral Number of attempts: 1 Placement Confirmation: positive ETCO2 and breath sounds checked- equal and bilateral Tube secured with: Tape Dental Injury: Teeth and Oropharynx as per pre-operative assessment

## 2015-03-16 NOTE — Progress Notes (Signed)
Patient sleepy but easily arousalable. Charge nurse Rosey Bath and Nursing Supervisor Alesha notified. Patient alert x2. Patient unable to stay awake during conversation, nods off and unable to follow commands.MD paged awaiting response.

## 2015-03-16 NOTE — Progress Notes (Signed)
[facility] [place] [facility]   PATIENT NAME: [name]    MR#:  [id number]  DATE OF BIRTH:  07-Apr-1941  SUBJECTIVE:  Patient has a large hematoma on the right anterior lower leg. She denies pain, dizziness or lightheaded.  REVIEW OF SYSTEMS:    Review of Systems  Constitutional: Negative for fever, chills and malaise/fatigue.  HENT: Negative for sore throat.   Eyes: Negative for blurred vision.  Respiratory: Negative for cough, hemoptysis, shortness of breath and wheezing.   Cardiovascular: Negative for chest pain, palpitations and leg swelling.  Gastrointestinal: Negative for nausea, vomiting, abdominal pain, diarrhea and blood in stool.  Genitourinary: Negative for dysuria.  Musculoskeletal: Negative for back pain.  Skin:       Hematoma right leg  Neurological: Negative for dizziness, tremors and headaches.  Endo/Heme/Allergies: Does not bruise/bleed easily.    Tolerating Diet:yes      DRUG ALLERGIES:  No Known Allergies  VITALS:  Blood pressure 101/55, pulse 93, temperature 99 F (37.2 C), temperature source Oral, resp. rate 18, height  (1.702 m), weight 96.163 kg (212 lb), SpO2 97 %.  PHYSICAL EXAMINATION:   Physical Exam  Constitutional: She is oriented to person, place, and time and well-developed, well-nourished, and in no distress. No distress.  HENT:  Head: Normocephalic.  Neck collar  Eyes: No scleral icterus.  Neck: Normal range of motion. Neck supple. No JVD present. No tracheal deviation present.  Cardiovascular: Normal rate, regular rhythm and normal heart sounds.  Exam reveals no gallop and no friction rub.   No murmur heard. Pulmonary/Chest: Effort normal and breath sounds normal. No respiratory distress. She has no wheezes. She has no rales. She exhibits no tenderness.  Abdominal: Soft. Bowel sounds are normal. She exhibits no distension and no mass. There is no tenderness. There is no rebound and no  guarding.  Musculoskeletal: Normal range of motion. She exhibits edema.  Neurological: She is alert and oriented to person, place, and time.  Skin: Skin is warm. No rash noted. No erythema.  Large hematoma right anterior leg  Psychiatric: Affect and judgment normal.      LABORATORY PANEL:   CBC  Recent Labs Lab 03/16/15 0620  WBC 7.6  HGB 9.3*  HCT 28.1*  PLT 104*   ------------------------------------------------------------------------------------------------------------------  Chemistries   Recent Labs Lab 03/16/15 0620  NA 138  K 3.8  CL 99*  CO2 35*  GLUCOSE 110*  BUN 15  CREATININE 0.68  CALCIUM 8.4*  AST 76*  ALT 21  ALKPHOS 51  BILITOT 0.5   ------------------------------------------------------------------------------------------------------------------  Cardiac Enzymes No results for input(s): TROPONINI in the last 168 hours. ------------------------------------------------------------------------------------------------------------------  RADIOLOGY:  No results found.   ASSESSMENT AND PLAN:  This is a 74 year old female with panic atrial fibrillation, pulmonary hypertension and valvular disease who was recently placed on cervical collar at [facility] after fall and presents with right lower extremity hematoma.   1. Right lower extremity hematoma with cellulitis and gangrenous changes of the skin: Patient has been seen and evaluated by thoracic surgery. Patient will require surgical evacuation and debridement for her right lower extremity. At this time we will continue on IV Rocephin for cellulitis.  2. Chronic atrial fibrillation: Patient heart rate is controlled. Aspirin is been placed on hold due to problem #1. Continue Coreg for heart rate control.    3. Essential hypertension: Blood pressure is acceptable. Continue Coreg.  4. Recent fall with cervical vertebral fracture and T1 compression  fracture: Continue cervical collar as recommended  by [facility] for total of 2 weeks. Continue supportive care.  5. Left thyroid nodule: Patient will need outpatient follow-up.    Management plans discussed with the patient and she is in agreement.  CODE STATUS: FULL  TOTAL TIME TAKING CARE OF THIS PATIENT: 30 minutes.     POSSIBLE D/C 3-4 days, DEPENDING ON CLINICAL CONDITION.   Meryl Ponder M.D on 03/16/2015 at 10:31 AM  Between 7am to 6pm - Pager - [phone] After 6pm go to [email or web address] - password [email or web address] [facility]  [facility]  Office  [phone]  CC: Primary care physician; Elizabeth Sauer, MD

## 2015-03-16 NOTE — Anesthesia Postprocedure Evaluation (Signed)
  Anesthesia Post-op Note  Patient: [name]  Procedure(s) Performed: Procedure(s):  EVACUATION OF HEMATOMA (Right) MINOR APPLICATION OF WOUND VAC (Right)  Anesthesia type:General LMA  Patient location: PACU  Post pain: Pain level controlled  Post assessment: Post-op Vital signs reviewed, Patient's Cardiovascular Status Stable, Respiratory Function Stable, Patent Airway and No signs of Nausea or vomiting  Post vital signs: Reviewed and stable  Last Vitals:  Filed Vitals:   03/16/15 1959  BP: 109/55  Pulse: 111  Temp: 36.5 C  Resp: 22    Level of consciousness: awake, alert  and patient cooperative  Complications: No apparent anesthesia complications

## 2015-03-16 NOTE — Op Note (Signed)
  OPERATIVE NOTE   PROCEDURE: 1. Evacuation of hematoma right leg with excisional debridement of necrotic skin and soft tissue  PRE-OPERATIVE DIAGNOSIS: Hematoma with necrotic soft tissues right leg  POST-OPERATIVE DIAGNOSIS: Same  SURGEON: Renford Dills, M.D.   ANESTHESIA: general  ESTIMATED BLOOD LOSS: 50 cc  FINDING(S): 1.  Large hematoma with necrotic skin and soft tissue  SPECIMEN(S):  Contents of the hematoma with necrotic skin and soft tissue  INDICATIONS:   [name] is a 74 y.o. female who presents with a large hematoma of the right leg the skin is obviously necrotic and it is blistering there is surrounding cellulitis. She is therefore undergoing evacuation with drainage in the operating room. Risks and benefits of been reviewed all questions answered patient has agreed to proceed.  DESCRIPTION: After obtaining full informed written consent, the patient was brought back to the operating room and placed supine upon the operating table.  The patient received IV antibiotics prior to induction.  After obtaining adequate anesthesia, the patient was prepped and draped in the standard fashion appropriate time out is called.    An elliptical incision was made around the hematoma using a 15 blade scalpel and the overlying skin and contents were then evacuated manually. The wound was then inspected and several bleeding points were then treated with Bovie cautery. Aerobic anaerobic tissue culture was obtained. The skin and soft tissues along the edge and in the base of the wound were then inspected and debridement using 15 blade scalpel. Hemostasis was then addressed with Bovie cautery. The wound was then irrigated and a VAC dressing was placed.  Prior to placing the [facility]: Lambert Rhonda W dressing the wound was measured it is 11 cm in width by 20.5 cm in length and a proximally 1 cm deep. This wound did not involve the muscular layer skin and soft tissues with subcutaneous fatty layer where the  tissues that were debrided and excised with scalpel.  COMPLICATIONS: None  CONDITION: Unchanged  Renford Dills, M.D. [facility] Office: [phone]   03/16/2015, 3:10 PM

## 2015-03-16 NOTE — Progress Notes (Signed)
Contacted Dr.Schiner no new orders giving.

## 2015-03-16 NOTE — Anesthesia Preprocedure Evaluation (Signed)
Anesthesia Evaluation  Patient identified by MRN, date of birth, ID band Patient awake    Reviewed: Allergy & Precautions, H&P , NPO status , Patient's Chart, lab work & pertinent test results  Airway Mallampati: III  TM Distance: >3 FB Neck ROM: limited    Dental no notable dental hx. (+) Teeth Intact   Pulmonary shortness of breath, former smoker,    Pulmonary exam normal breath sounds clear to auscultation       Cardiovascular Exercise Tolerance: Poor hypertension, + Peripheral Vascular Disease  Atrial Fibrillation + Valvular Problems/Murmurs  Rhythm:regular Rate:Normal + Systolic murmurs and + Diastolic murmurs    Neuro/Psych  Neuromuscular disease negative psych ROS   GI/Hepatic negative GI ROS, Neg liver ROS, neg GERD  ,  Endo/Other  negative endocrine ROS  Renal/GU negative Renal ROS  negative genitourinary   Musculoskeletal  (+) Arthritis ,   Abdominal   Peds  Hematology negative hematology ROS (+)   Anesthesia Other Findings Past Medical History:   Hypertension                                                 Atrial fibrillation                                          HLD (hyperlipidemia)                                         Valvular heart disease                                       Pulmonary HTN                                                Lower extremity edema                                        HTN (hypertension)                              12/18/2014    A-fib                                           12/18/2014    Benign essential HTN                            10/25/2014    Chronic venous insufficiency                    12/29/2014    Chronic systolic heart failure  05/18/2014     Comment:Overview:  Global ef 35%    MI (mitral incompetence)                        05/18/2014   Carpal tunnel syndrome                          12/30/2014    Osteoarthrosis, shoulder region                  12/29/2014    Degenerative arthritis of hip                   06/08/2014    Bilateral lower extremity edema                 12/18/2014    Cellulitis of leg, right                        12/18/2014    History of repair of hip joint                  09/19/2014    Weakness and numbness in bilateral upper extremities.  Reproductive/Obstetrics negative OB ROS                             Anesthesia Physical Anesthesia Plan  ASA: IV  Anesthesia Plan: General LMA   Post-op Pain Management:    Induction:   Airway Management Planned:   Additional Equipment:   Intra-op Plan:   Post-operative Plan:   Informed Consent: I have reviewed the patients History and Physical, chart, labs and discussed the procedure including the risks, benefits and alternatives for the proposed anesthesia with the patient or authorized representative who has indicated his/her understanding and acceptance.   Dental Advisory Given  Plan Discussed with: Anesthesiologist, CRNA and Surgeon  Anesthesia Plan Comments: (Patient informed that she is higher risk for complications during this procedure due to  her medical history.  Patient voiced understanding. )        Anesthesia Quick Evaluation

## 2015-03-16 NOTE — Progress Notes (Signed)
Dr. Clint Guy notified no new orders giving.Nursing will continue to monitor.

## 2015-03-16 NOTE — Transfer of Care (Signed)
Immediate Anesthesia Transfer of Care Note  Patient: [name]  Procedure(s) Performed: Procedure(s):  EVACUATION OF HEMATOMA (Right) MINOR APPLICATION OF WOUND VAC (Right)  Patient Location: PACU  Anesthesia Type:General  Level of Consciousness: sedated  Airway & Oxygen Therapy: Patient Spontanous Breathing and Patient connected to face mask oxygen  Post-op Assessment: Report given to RN and Post -op Vital signs reviewed and stable  Post vital signs: Reviewed and stable  Last Vitals:  Filed Vitals:   03/16/15 1128  BP: 106/62  Pulse: 83  Temp: 37.2 C  Resp: 16    Complications: No apparent anesthesia complications

## 2015-03-17 ENCOUNTER — Encounter: Payer: Self-pay | Admitting: Vascular Surgery

## 2015-03-17 LAB — CBC
HCT: 24.2 % — ABNORMAL LOW (ref 35.0–47.0)
Hemoglobin: 7.9 g/dL — ABNORMAL LOW (ref 12.0–16.0)
MCH: 28.9 pg (ref 26.0–34.0)
MCHC: 32.7 g/dL (ref 32.0–36.0)
MCV: 88.4 fL (ref 80.0–100.0)
PLATELETS: 89 10*3/uL — AB (ref 150–440)
RBC: 2.74 MIL/uL — AB (ref 3.80–5.20)
RDW: 14.9 % — AB (ref 11.5–14.5)
WBC: 5.5 10*3/uL (ref 3.6–11.0)

## 2015-03-17 LAB — BASIC METABOLIC PANEL
Anion gap: 4 — ABNORMAL LOW (ref 5–15)
BUN: 12 mg/dL (ref 6–20)
CALCIUM: 8.3 mg/dL — AB (ref 8.9–10.3)
CO2: 38 mmol/L — ABNORMAL HIGH (ref 22–32)
CREATININE: 0.57 mg/dL (ref 0.44–1.00)
Chloride: 97 mmol/L — ABNORMAL LOW (ref 101–111)
GFR calc Af Amer: >60 mL/min (ref >60)
GLUCOSE: 91 mg/dL (ref 65–99)
POTASSIUM: 3.6 mmol/L (ref 3.5–5.1)
SODIUM: 139 mmol/L (ref 135–145)

## 2015-03-17 MED ORDER — DOCUSATE SODIUM 100 MG PO CAPS
200.0000 mg | ORAL_CAPSULE | Freq: Two times a day (BID) | ORAL | Status: DC
  Administered 2015-03-17 – 2015-03-18 (×3): 200 mg via ORAL
  Filled 2015-03-17 (×3): qty 2

## 2015-03-17 MED ORDER — FERROUS SULFATE 325 (65 FE) MG PO TABS
325.0000 mg | ORAL_TABLET | Freq: Two times a day (BID) | ORAL | Status: DC
  Administered 2015-03-17 – 2015-03-18 (×4): 325 mg via ORAL
  Filled 2015-03-17 (×4): qty 1

## 2015-03-17 NOTE — Progress Notes (Signed)
Wound vac cannister changed kout total wound output 300 bloody drainage

## 2015-03-17 NOTE — Care Management Important Message (Signed)
Important Message  Patient Details  Name: [name] MRN: [id number] Date of Birth: [birth year]   Medicare Important Message Given:  Yes-second notification given    [name] 03/17/2015, 10:58 AM

## 2015-03-17 NOTE — Progress Notes (Signed)
Left msg for Dr. Gilda Crease regarding pt/mobility orders for patient.

## 2015-03-17 NOTE — Plan of Care (Signed)
Problem: Phase I Progression Outcomes Goal: Sutures/staples intact Outcome: Not Met (add Reason) dressing intact unable to see wound  Problem: Phase II Progression Outcomes Goal: Sutures/staples intact Outcome: Not Met (add Reason) Unable to assess dressing intact  Problem: Discharge Progression Outcomes Goal: Staples/sutures removed Outcome: Not Met (add Reason) Unable to assess dressing in place     

## 2015-03-17 NOTE — Progress Notes (Signed)
CSW seeking SNF placement for patient.  Full assessment to follow.   Kjerstin Abrigo, MSW, LCSWA  [phone]   

## 2015-03-17 NOTE — Progress Notes (Signed)
[facility] - [place] at [facility]   PATIENT NAME: [name]    MR#:  [id number]  DATE OF BIRTH:  04-27-1941  SUBJECTIVE:  Patient would like to get up and move. She is wound VAC placed.  REVIEW OF SYSTEMS:    Review of Systems  Constitutional: Negative for fever, chills and malaise/fatigue.  HENT: Negative for sore throat.   Eyes: Negative for blurred vision.  Respiratory: Negative for cough, hemoptysis, shortness of breath and wheezing.   Cardiovascular: Negative for chest pain, palpitations and leg swelling.  Gastrointestinal: Negative for nausea, vomiting, abdominal pain, diarrhea and blood in stool.  Genitourinary: Negative for dysuria.  Musculoskeletal: Negative for back pain.  Skin:       Wound vac placed   Neurological: Negative for dizziness, tremors and headaches.  Endo/Heme/Allergies: Does not bruise/bleed easily.    Tolerating Diet:yes      DRUG ALLERGIES:  No Known Allergies  VITALS:  Blood pressure 122/96, pulse 116, temperature 97.8 F (36.6 C), temperature source Oral, resp. rate 18, height  (1.702 m), weight 96.163 kg (212 lb), SpO2 91 %.  PHYSICAL EXAMINATION:   Physical Exam  Constitutional: She is oriented to person, place, and time and well-developed, well-nourished, and in no distress. No distress.  HENT:  Head: Normocephalic.  Neck collar  Eyes: No scleral icterus.  Neck: Normal range of motion. Neck supple. No JVD present. No tracheal deviation present.  Cardiovascular: Normal rate, regular rhythm and normal heart sounds.  Exam reveals no gallop and no friction rub.   No murmur heard. Pulmonary/Chest: Effort normal and breath sounds normal. No respiratory distress. She has no wheezes. She has no rales. She exhibits no tenderness.  Abdominal: Soft. Bowel sounds are normal. She exhibits no distension and no mass. There is no tenderness. There is no rebound and no guarding.  Musculoskeletal: Normal range of  motion. She exhibits edema.  Neurological: She is alert and oriented to person, place, and time.  Skin: Skin is warm. No rash noted. No erythema.  Wound VAC placed right leg  Psychiatric: Affect and judgment normal.      LABORATORY PANEL:   CBC  Recent Labs Lab 03/17/15 0545  WBC 5.5  HGB 7.9*  HCT 24.2*  PLT 89*   ------------------------------------------------------------------------------------------------------------------  Chemistries   Recent Labs Lab 03/16/15 0620 03/17/15 0545  NA 138 139  K 3.8 3.6  CL 99* 97*  CO2 35* 38*  GLUCOSE 110* 91  BUN 15 12  CREATININE 0.68 0.57  CALCIUM 8.4* 8.3*  AST 76*  --   ALT 21  --   ALKPHOS 51  --   BILITOT 0.5  --    ------------------------------------------------------------------------------------------------------------------  Cardiac Enzymes No results for input(s): TROPONINI in the last 168 hours. ------------------------------------------------------------------------------------------------------------------  RADIOLOGY:  No results found.   ASSESSMENT AND PLAN:  This is a 74 year old female with panic atrial fibrillation, pulmonary hypertension and valvular disease who was recently placed on cervical collar at [facility] after fall and presents with right lower extremity hematoma.   1. Right lower extremity hematoma with cellulitis and gangrenous changes of the skin: Patient is postop day #1 for debridement and evacuation of hematoma. Patient has a wound VAC in place. Patient will continue on Rocephin for cellulitis. Appreciate vascular surgery consultation.  2. Chronic atrial fibrillation: Patient heart rate is controlled. Aspirin is been placed on hold due to problem #1. Continue Coreg for heart rate control.    3. Essential hypertension: Blood  pressure is acceptable. Continue Coreg.  4. Recent fall with cervical vertebral fracture and T1 compression fracture: Continue cervical collar as  recommended by [facility] for total of 2 weeks. Continue supportive care.  5. Left thyroid nodule: Patient will need outpatient follow-up.    Management plans discussed with the patient and she is in agreement.  CODE STATUS: FULL  TOTAL TIME TAKING CARE OF THIS PATIENT: 25 minutes.     POSSIBLE D/C 3-4 days, DEPENDING ON CLINICAL CONDITION.   Jessieca Rhem M.D on 03/17/2015 at 11:42 AM  Between 7am to 6pm - Pager - [phone] After 6pm go to [email or web address] - password [email or web address] [facility]  [place] [facility]  Office  [phone]  CC: Primary care physician; Elizabeth Sauer, MD

## 2015-03-17 NOTE — Evaluation (Signed)
Physical Therapy Evaluation Patient [name] [name] MRN: [id number] DOB: [birth year] Today's Date: 03/17/2015   History of Present Illness  74 yo female with onset of cervical and T1 fractures after a fall on 9/3, now presenting with surgical evacuation of RLE hematoma and necrotic tissue, in wound vac.  Clinical Impression  Pt was seen for assessment of her mobility with RW to get to chair and then elevated RLE on chair.  Nursing in to assist and instruct for return to bed later.  SNF recommended as pt will need help to walk and daughter is at work all day. Will plan to continue gait and transfers with pt as she is not  Going to be able to assist with gait with less than 2 people until vac is off for now.    Follow Up Recommendations SNF    Equipment Recommendations  None recommended by PT    Recommendations for Other Services       Precautions / Restrictions Precautions Precautions: Fall (wound vac RLE) Required Braces or Orthoses: Cervical Brace Cervical Brace: At all times Restrictions Weight Bearing Restrictions: No      Mobility  Bed Mobility Overal bed mobility: Needs Assistance Bed Mobility: Supine to Sit     Supine to sit: Mod assist     General bed mobility comments: can reach for bedrail with HOB slightly elevated but needs help to lift trunk and to move LE's  Transfers Overall transfer level: Needs assistance Equipment used: Rolling walker (2 wheeled);2 person hand held assist Transfers: Sit to/from [employer] Sit to Stand: Mod assist;+2 physical assistance;+2 safety/equipment Stand pivot transfers: Mod assist;+2 physical assistance;+2 safety/equipment       General transfer comment: sidesteps to chair with slow progression and dense verbal and tactile cues for the activity  Ambulation/Gait Ambulation/Gait assistance: Mod assist Ambulation Distance (Feet): 5 Feet Assistive device: Rolling walker (2 wheeled) Gait  Pattern/deviations: Step-to pattern;Trunk flexed;Wide base of support;Antalgic Gait velocity: reduced Gait velocity interpretation: Below normal speed for age/gender    Stairs            Wheelchair Mobility    Modified Rankin (Stroke Patients Only)       Balance Overall balance assessment: Needs assistance Sitting-balance support: Feet supported Sitting balance-Leahy Scale: Fair   Postural control: Posterior lean Standing balance support: Bilateral upper extremity supported Standing balance-Leahy Scale: Poor                               Pertinent Vitals/Pain Pain Assessment: Faces Faces Pain Scale: Hurts a little bit Pain Location: R leg Pain Descriptors / Indicators: Operative site guarding Pain Intervention(s): Limited activity within patient's tolerance;Monitored during session;Premedicated before session;Repositioned    Home Living Family/patient expects to be discharged to:: Skilled nursing facility Living Arrangements: Children                    Prior Function Level of Independence: Independent with assistive device(s)               Hand Dominance        Extremity/Trunk Assessment   Upper Extremity Assessment: Overall WFL for tasks assessed           Lower Extremity Assessment: Generalized weakness      Cervical / Trunk Assessment: Normal  Communication   Communication: No difficulties  Cognition Arousal/Alertness: Awake/alert Behavior During Therapy: WFL for tasks assessed/performed Overall Cognitive Status: Within  Functional Limits for tasks assessed                      General Comments General comments (skin integrity, edema, etc.): Pt was able to tolerate movement better than PT predicted but is getting up to chair with some diffculty mainly over her weakness.      Exercises        Assessment/Plan    PT Assessment Patient needs continued PT services  PT Diagnosis Acute pain;Difficulty  walking   PT Problem List Decreased strength;Decreased range of motion;Decreased activity tolerance;Decreased balance;Decreased mobility;Decreased coordination;Cardiopulmonary status limiting activity;Decreased skin integrity;Pain  PT Treatment Interventions DME instruction;Gait training;Functional mobility training;Therapeutic exercise;Therapeutic activities;Balance training;Neuromuscular re-education;Patient/family education   PT Goals (Current goals can be found in the Care Plan section) Acute Rehab PT Goals Patient Stated Goal: to go home when able PT Goal Formulation: With patient Time For Goal Achievement: 03/31/15 Potential to Achieve Goals: Good    Frequency Min 2X/week   Barriers to discharge Decreased caregiver support (daughter is working)      Control and instrumentation engineer of Session Equipment Utilized During Treatment: Oxygen;Gait belt Activity Tolerance: Patient tolerated treatment well;Patient limited by fatigue;Other (comment) (vac on RLE anterior compartment skin but not involving mm's) Patient left: in chair;with call bell/phone within reach;with chair alarm set Nurse Communication: Mobility status         Time: 1610-9604 PT Time Calculation (min) (ACUTE ONLY): 34 min   Charges:   PT Evaluation $Initial PT Evaluation Tier I: 1 Procedure PT Treatments $Therapeutic Activity: 8-22 mins   PT G Codes:        Ivar Drape 2015/03/21, 12:46 PM   Samul Dada, PT MS Acute Rehab Dept. Number: [phone] [id number] and [facility] [phone]

## 2015-03-18 LAB — CBC
HCT: 23.5 % — ABNORMAL LOW (ref 35.0–47.0)
HEMOGLOBIN: 7.7 g/dL — AB (ref 12.0–16.0)
MCH: 28.7 pg (ref 26.0–34.0)
MCHC: 32.8 g/dL (ref 32.0–36.0)
MCV: 87.4 fL (ref 80.0–100.0)
PLATELETS: 96 10*3/uL — AB (ref 150–440)
RBC: 2.69 MIL/uL — AB (ref 3.80–5.20)
RDW: 14.9 % — ABNORMAL HIGH (ref 11.5–14.5)
WBC: 5.1 10*3/uL (ref 3.6–11.0)

## 2015-03-18 MED ORDER — DOCUSATE SODIUM 100 MG PO CAPS: 200.0000 mg | ORAL_CAPSULE | Freq: Two times a day (BID) | ORAL | Status: AC

## 2015-03-18 MED ORDER — BISACODYL 10 MG RE SUPP
10.0000 mg | Freq: Every day | RECTAL | Status: DC
  Administered 2015-03-18: 10 mg via RECTAL
  Filled 2015-03-18: qty 1

## 2015-03-18 MED ORDER — FERROUS SULFATE 325 (65 FE) MG PO TABS: 325.0000 mg | ORAL_TABLET | Freq: Two times a day (BID) | ORAL | Status: DC

## 2015-03-18 MED ORDER — CEFUROXIME AXETIL 500 MG PO TABS: 500.0000 mg | ORAL_TABLET | Freq: Two times a day (BID) | ORAL | Status: DC

## 2015-03-18 MED ORDER — POLYETHYLENE GLYCOL 3350 17 G PO PACK: 17.0000 g | PACK | Freq: Every day | ORAL | Status: DC | PRN

## 2015-03-18 MED ORDER — DIAZEPAM 2 MG PO TABS
1.0000 mg | ORAL_TABLET | Freq: Once | ORAL | Status: AC
  Administered 2015-03-18: 1 mg via ORAL
  Filled 2015-03-18: qty 1

## 2015-03-18 MED ORDER — NYSTATIN 100000 UNIT/GM EX POWD: CUTANEOUS | Status: DC

## 2015-03-18 NOTE — Clinical Social Work Note (Signed)
[place]:  [phone] [id number]

## 2015-03-18 NOTE — Discharge Summary (Signed)
[facility] - [place] at [facility]   PATIENT NAME: [name]    MR#:  [id number]  DATE OF BIRTH:  [birth year]  DATE OF ADMISSION:  03/15/2015 ADMITTING PHYSICIAN: [name]  DATE OF DISCHARGE: 03/18/2015  PRIMARY CARE PHYSICIAN: [name]    ADMISSION DIAGNOSIS:  leg swollen HEMATOMA  DISCHARGE DIAGNOSIS:  Active Problems:   Hematoma of right lower extremity   Pressure ulcer   SECONDARY DIAGNOSIS:   Past Medical History  Diagnosis Date  . Hypertension   . Atrial fibrillation   . HLD (hyperlipidemia)   . Valvular heart disease   . Pulmonary HTN   . Lower extremity edema   . HTN (hypertension) 12/18/2014  . A-fib 12/18/2014  . Benign essential HTN 10/25/2014  . Chronic venous insufficiency 12/29/2014  . Chronic systolic heart failure 05/18/2014    Overview:  Global ef 35%   . 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 Carpal tunnel syndrome 12/30/2014  . 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 Degenerative arthritis of hip 06/08/2014  . Bilateral lower extremity edema 12/18/2014  . Cellulitis of leg, right 12/18/2014  . History of repair of hip joint 09/19/2014    HOSPITAL COURSE:  This is a 74 year old female with panic atrial fibrillation, pulmonary hypertension and valvular disease who was recently placed on cervical collar at [facility] after fall and presents with right lower extremity hematoma.   1. Right lower extremity hematoma with cellulitis and gangrenous changes of the skin: Patient is postop day #2 for debridement and evacuation of hematoma. Patient has a wound VAC in place.This was changed on day of discharge and will need to be changed every week. Patient will be on CEFTIN for cellulitis..   2. Chronic atrial fibrillation: Patient heart rate is controlled. Aspirin is been placed on hold due to problem #1. Continue Coreg for heart rate control.   3. Essential hypertension: Blood pressure is acceptable. Continue  Coreg.  4. Recent fall with cervical vertebral fracture and T1 compression fracture: Continue cervical collar as recommended by [facility] for total of 2 weeks. Continue supportive care. She should follow up at [facility] in 1 week [name], Neurosurgeon.  5. Left thyroid nodule: Patient will need outpatient follow-up.     DISCHARGE CONDITIONS AND DIET:  Stable to SNF  Regular diet   CONSULTS OBTAINED:  Treatment Team:  Marie Dills, [name]  DRUG ALLERGIES:  No Known Allergies  DISCHARGE MEDICATIONS:   Current Discharge Medication List    START taking these medications   Details  cefUROXime (CEFTIN) 500 MG tablet Take 1 tablet (500 mg total) by mouth 2 (two) times daily with a meal. Qty: 14 tablet, Refills: 0    ferrous sulfate 325 (65 FE) MG tablet Take 1 tablet (325 mg total) by mouth 2 (two) times daily with a meal. Qty: 60 tablet, Refills: 3    nystatin (MYCOSTATIN/NYSTOP) 100000 UNIT/GM POWD Apply to affected area tid Qty: 1 Bottle, Refills: 0    polyethylene glycol (MIRALAX / GLYCOLAX) packet Take 17 g by mouth daily as needed for mild constipation. Qty: 14 each, Refills: 0      CONTINUE these medications which have NOT CHANGED   Details  carvedilol (COREG) 25 MG tablet Take 25 mg by mouth 2 (two) times daily with a meal.     furosemide (LASIX) 80 MG tablet Take 40 mg by mouth daily.       STOP taking these medications  aspirin EC 81 MG tablet      cephALEXin (KEFLEX) 500 MG capsule               Today   CHIEF COMPLAINT:  No issues doing well some SOB which is at ehr baseline   VITAL SIGNS:  Blood pressure 119/60, pulse 93, temperature 98 F (36.7 C), temperature source Oral, resp. rate 18, height 5\' 7"  (1.702 m), weight 96.163 kg (212 lb), SpO2 90 %.   REVIEW OF SYSTEMS:  Review of Systems  Constitutional: Negative for fever, chills and malaise/fatigue.  HENT: Negative for sore throat.   Eyes: Negative for blurred vision.   Respiratory: Negative for cough, hemoptysis, shortness of breath and wheezing.   Cardiovascular: Negative for chest pain, palpitations and leg swelling.  Gastrointestinal: Negative for nausea, vomiting, abdominal pain, diarrhea and blood in stool.  Genitourinary: Negative for dysuria.  Musculoskeletal: Negative for back pain.  Neurological: Negative for dizziness, tremors and headaches.  Endo/Heme/Allergies: Does not bruise/bleed easily.     PHYSICAL EXAMINATION:  GENERAL:  74 y.o.-year-old patient lying in the bed with no acute distress.  NECK:  Supple, no jugular venous distention. No thyroid enlargement, no tenderness.  LUNGS: Normal breath sounds bilaterally, no wheezing, rales,rhonchi  No use of accessory muscles of respiration.  CARDIOVASCULAR: S1, S2 normal. No murmurs, rubs, or gallops.  ABDOMEN: Soft, non-tender, non-distended. Bowel sounds present. No organomegaly or mass.  EXTREMITIES: ++ pedal edema, NO cyanosis, or clubbing.  PSYCHIATRIC: The patient is alert and oriented x 3.  SKIN: wound vac placed right leg .   DATA REVIEW:   CBC  Recent Labs [name] 03/18/15 0613  WBC 5.1  HGB 7.7*  HCT 23.5*  PLT 96*    Chemistries   Recent Labs [name] 03/16/15 0620 03/17/15 0545  NA 138 139  K 3.8 3.6  CL 99* 97*  CO2 35* 38*  GLUCOSE 110* 91  BUN 15 12  CREATININE 0.68 0.57  CALCIUM 8.4* 8.3*  AST 76*  --   ALT 21  --   ALKPHOS 51  --   BILITOT 0.5  --     Cardiac Enzymes No results for input(s): TROPONINI in the last 168 hours.  Microbiology Results  @[email or web address] @  RADIOLOGY:  No results found.    Management plans discussed with the patient and she is in agreement. Stable for discharge SNF  Patient should follow up with [name] 1 week  CODE STATUS:     Code Status Orders        Start     Ordered   03/15/15 1226  Full code   Continuous     03/15/15 1226    Advance Directive Documentation        Most Recent Value   Type of Advance  Directive  Living will   Pre-existing out of facility DNR order (yellow form or pink MOST form)     "MOST" Form in Place?        TOTAL TIME TAKING CARE OF THIS PATIENT: 35 minutes.    Jhalil Silvera M.D on 03/18/2015 at 9:08 AM  Between 7am to 6pm - Pager - [phone] After 6pm go to [email or web address] - password [email or web address] [facility]  [place] [facility]  Office  [phone]  CC: Primary care physician; [name]

## 2015-03-18 NOTE — Progress Notes (Signed)
Report called to [place] at [facility].  EMS called to transport the pt

## 2015-03-18 NOTE — Progress Notes (Signed)
Patient for d/c today to SNF bed today after final dc orders rec'd. Son, Dorene Sorrow and patient agreeable to this plan- plan transfer via EMS. Reece Levy, MSW, Theresia Majors [phone]

## 2015-03-18 NOTE — Progress Notes (Signed)
Pt. Slept peacefully throughout the night. Yeast noted to right groin and bilateral buttocks, thick tan discoloration noted to lower bilateral buttocks near perineum. Tele continues, A-fib. C/O neck pain, medication given with effective results. Patient alert and oriented no acute distress noted. Wound vac to RLE continues at 125 mmhg continous. Small amount of serosanguineous drainage in canister. Will continue to monitor patient.

## 2015-03-18 NOTE — Progress Notes (Signed)
Pt said her right leg is jerking and that she thinks it is related to her neck.  She was having a hard time communicating to me exactly what the problem was and what I needed to do to help it.  She has tylenol, mso4, and dilaudid for pain.  MD notified and did not want to order oral pain med other than a once of valium

## 2015-03-18 NOTE — Clinical Social Work Placement (Signed)
   CLINICAL SOCIAL WORK PLACEMENT  NOTE  Date:  03/18/2015  Patient Details  Name: [name] MRN: [id number] Date of Birth: 12/03/1940  Clinical Social Work is seeking post-discharge placement for this patient at the Skilled  Nursing Facility level of care (*CSW will initial, date and re-position this form in  chart as items are completed):  No   Patient/family provided with [facility] Clinical Social Work Department's list of facilities offering this level of care within the geographic area requested by the patient (or if unable, by the patient's family).  Yes   Patient/family informed of their freedom to choose among providers that offer the needed level of care, that participate in Medicare, Medicaid or managed care program needed by the patient, have an available bed and are willing to accept the patient.  Yes   Patient/family informed of [facility]'s ownership interest in [facility] and [facility], as well as of the fact that they are under no obligation to receive care at these facilities.  PASRR submitted to EDS on       PASRR number received on       Existing PASRR number confirmed on       FL2 transmitted to all facilities in geographic area requested by pt/family on 03/17/15     FL2 transmitted to all facilities within larger geographic area on       Patient informed that his/her managed care company has contracts with or will negotiate with certain facilities, including the following:        Yes   Patient/family informed of bed offers received.  Patient chooses bed at  ([facility])     Physician recommends and patient chooses bed at      Patient to be transferred to [facility] on  .  Patient to be transferred to facility by EMS     Patient family notified on 03/18/15 of transfer.  Name of family member notified:  son- Dorene Sorrow     PHYSICIAN       Additional Comment:    _______________________________________________ Liliana Cline, LCSW 03/18/2015, 11:33 AM

## 2015-03-18 NOTE — Clinical Social Work Note (Signed)
Clinical Social Work Assessment  Patient Details  Name: [name] MRN: [id number] Date of Birth: [birth year]  Date of referral:  03/17/15               Reason for consult:  Discharge Planning                Permission sought to share information with:  Family Supports Permission granted to share information::  Yes, Verbal Permission Granted  Name::      (sonSonia Side)  Agency::  SNF's  Relationship::  [name] [phone]  Contact Information:  [name] [phone]  Housing/Transportation Living arrangements for the past 2 months:  [place] of Information:  Adult Children Patient Interpreter Needed:  None Criminal Activity/Legal Involvement Pertinent to Current Situation/Hospitalization:  No - Comment as needed Significant Relationships:  Adult Children Lives with:    Do you feel safe going back to the place where you live?  No Need for family participation in patient care:  Yes (Comment)  Care giving concerns:  CSW met with patient who reports she lives with her son and his wife- SNF is recommended at dc and she is agreeable to this.    Social Worker assessment / plan:  CSW will complete FL2 and PASARR for SNF search.  Employment status:  Retired Forensic scientist:  Medicare PT Recommendations:  [place] / Referral to community resources:  [place]  Patient/Family's Response to care:  Patient agrees to treatment plan (VAC and SNF) and is optimistic in regards to her rehab and wound healing.  Patient/Family's Understanding of and Emotional Response to Diagnosis, Current Treatment, and Prognosis:  Patient understands her illness and treatment plans- she appears anxious and eager for healing and to be able to go home- she is struggling with the neck brace and VAC but understands the need and goals.   Emotional Assessment Appearance:  Well-Groomed, Developmentally appropriate, Other (Comment Required (appears in some  pain- reports "spasms") Attitude/Demeanor/Rapport:  Unable to Assess Affect (typically observed):  Hopeful, Appropriate Orientation:  Oriented to Self, Oriented to Place, Oriented to  Time, Oriented to Situation Alcohol / Substance use:  Not Applicable Psych involvement (Current and /or in the community):  No (Comment)  Discharge Needs  Concerns to be addressed:  Discharge Planning Concerns Readmission within the last 30 days:  No Current discharge risk:  Physical Impairment Barriers to Discharge:  No Barriers Identified   Ludwig Clarks, LCSW 03/18/2015, 11:22 AM

## 2015-03-19 LAB — WOUND CULTURE: CULTURE: NO GROWTH

## 2015-03-20 LAB — ANAEROBIC CULTURE

## 2015-03-24 ENCOUNTER — Inpatient Hospital Stay: Payer: Medicare Other | Admitting: Family Medicine

## 2015-03-30 ENCOUNTER — Other Ambulatory Visit: Payer: Self-pay | Admitting: Vascular Surgery

## 2015-03-31 ENCOUNTER — Ambulatory Visit
Admission: RE | Admit: 2015-03-31 | Discharge: 2015-03-31 | Disposition: A | Discharge status: Home / Self Care | Payer: Medicare (Managed Care) | Source: Ambulatory Visit | Attending: Vascular Surgery | Admitting: Vascular Surgery

## 2015-03-31 ENCOUNTER — Encounter
Admission: RE | Admit: 2015-03-31 | Discharge: 2015-03-31 | Disposition: A | Discharge status: Home / Self Care | Payer: Medicare (Managed Care) | Source: Ambulatory Visit | Attending: Vascular Surgery | Admitting: Vascular Surgery

## 2015-03-31 DIAGNOSIS — Z01818 Encounter for other preprocedural examination: Secondary | ICD-10-CM

## 2015-03-31 LAB — BASIC METABOLIC PANEL
ANION GAP: 8 (ref 5–15)
BUN: 12 mg/dL (ref 6–20)
CO2: 35 mmol/L — ABNORMAL HIGH (ref 22–32)
CREATININE: 0.76 mg/dL (ref 0.44–1.00)
Calcium: 9.1 mg/dL (ref 8.9–10.3)
Chloride: 94 mmol/L — ABNORMAL LOW (ref 101–111)
Glucose, Bld: 94 mg/dL (ref 65–99)
Potassium: 3.5 mmol/L (ref 3.5–5.1)
SODIUM: 137 mmol/L (ref 135–145)

## 2015-03-31 LAB — CBC WITH DIFFERENTIAL/PLATELET
BASOS ABS: 0 10*3/uL (ref 0–0.1)
BASOS PCT: 0 %
EOS ABS: 0.2 10*3/uL (ref 0–0.7)
Eosinophils Relative: 3 %
HEMATOCRIT: 33.1 % — AB (ref 35.0–47.0)
HEMOGLOBIN: 10.8 g/dL — AB (ref 12.0–16.0)
Lymphocytes Relative: 29 %
Lymphs Abs: 2.2 10*3/uL (ref 1.0–3.6)
MCH: 28.9 pg (ref 26.0–34.0)
MCHC: 32.6 g/dL (ref 32.0–36.0)
MCV: 88.4 fL (ref 80.0–100.0)
MONOS PCT: 10 %
Monocytes Absolute: 0.7 10*3/uL (ref 0.2–0.9)
NEUTROS ABS: 4.6 10*3/uL (ref 1.4–6.5)
NEUTROS PCT: 58 %
Platelets: 304 10*3/uL (ref 150–440)
RBC: 3.74 MIL/uL — AB (ref 3.80–5.20)
RDW: 16.4 % — ABNORMAL HIGH (ref 11.5–14.5)
WBC: 7.8 10*3/uL (ref 3.6–11.0)

## 2015-03-31 LAB — PROTIME-INR
INR: 1.26
PROTHROMBIN TIME: 16 s — AB (ref 11.4–15.0)

## 2015-03-31 LAB — TYPE AND SCREEN
ABO/RH(D): A POS
Antibody Screen: NEGATIVE

## 2015-03-31 LAB — APTT: APTT: 30 s (ref 24–36)

## 2015-03-31 LAB — ABO/RH: ABO/RH(D): A POS

## 2015-03-31 NOTE — Patient Instructions (Signed)
  Your procedure is scheduled on: Wednesday Sept. 28, 2016. Report to Same Day Surgery. To find out your arrival time please call [phone] between 1PM - 3PM on Tuesday Sept. 27, 2016.  Remember: Instructions that are not followed completely may result in serious medical risk, up to and including death, or upon the discretion of your surgeon and anesthesiologist your surgery may need to be rescheduled.    _x___ 1. Do not eat food or drink liquids after midnight. No gum chewing or hard candies.     ____ 2. No Alcohol for 24 hours before or after surgery.   ____ 3. Bring all medications with you on the day of surgery if instructed.    _x___ 4. Notify your doctor if there is any change in your medical condition     (cold, fever, infections).     Do not wear jewelry, make-up, hairpins, clips or nail polish.  Do not wear lotions, powders, or perfumes. You may wear deodorant.  Do not shave 48 hours prior to surgery. Men may shave face and neck.  Do not bring valuables to the hospital.    [facility] is not responsible for any belongings or valuables.               Contacts, dentures or bridgework may not be worn into surgery.  Leave your suitcase in the car. After surgery it may be brought to your room.  For patients admitted to the hospital, discharge time is determined by your treatment team.   Patients discharged the day of surgery will not be allowed to drive home.    Please read over the following fact sheets that you were given:   [facility] Preparing for Surgery  __x__ Take these medicines the morning of surgery with A SIP OF WATER:    1. carvedilol (COREG) On an empty stomach.   ____ Fleet Enema (as directed)   __x__ Use SAGE wipes as directed  ____ Use inhalers on the day of surgery  ____ Stop metformin 2 days prior to surgery    ____ Take 1/2 of usual insulin dose the night before surgery and none on the morning of surgery.   ____ Stop Coumadin/Plavix/aspirin on  does not apply.  ____ Stop Anti-inflammatories on does not apply.  Tylenol is ok to take for pain.   ____ Stop supplements until after surgery.    ____ Bring C-Pap to the hospital.

## 2015-03-31 NOTE — Pre-Procedure Instructions (Signed)
Spoke with Vernona Rieger at Dr. Marijean Heath office, she is waiting for office visit 03/24/15 to be dictated, then she will fax H+P to PAT.

## 2015-04-06 ENCOUNTER — Ambulatory Visit
Admission: RE | Admit: 2015-04-06 | Discharge: 2015-04-06 | Disposition: A | Discharge status: Home / Self Care | Payer: Medicare Other | Source: Ambulatory Visit | Attending: Vascular Surgery | Admitting: Vascular Surgery

## 2015-04-06 ENCOUNTER — Ambulatory Visit: Payer: Medicare Other | Admitting: Anesthesiology

## 2015-04-06 ENCOUNTER — Encounter: Payer: Self-pay | Admitting: *Deleted

## 2015-04-06 ENCOUNTER — Encounter
Admission: RE | Disposition: A | Discharge status: Home / Self Care | Payer: Self-pay | Source: Ambulatory Visit | Attending: Vascular Surgery

## 2015-04-06 DIAGNOSIS — Z6832 Body mass index (BMI) 32.0-32.9, adult: Secondary | ICD-10-CM | POA: Insufficient documentation

## 2015-04-06 DIAGNOSIS — Y929 Unspecified place or not applicable: Secondary | ICD-10-CM | POA: Insufficient documentation

## 2015-04-06 DIAGNOSIS — I739 Peripheral vascular disease, unspecified: Secondary | ICD-10-CM | POA: Insufficient documentation

## 2015-04-06 DIAGNOSIS — Z86718 Personal history of other venous thrombosis and embolism: Secondary | ICD-10-CM | POA: Insufficient documentation

## 2015-04-06 DIAGNOSIS — S8011XA Contusion of right lower leg, initial encounter: Secondary | ICD-10-CM | POA: Insufficient documentation

## 2015-04-06 DIAGNOSIS — Y939 Activity, unspecified: Secondary | ICD-10-CM | POA: Diagnosis not present

## 2015-04-06 DIAGNOSIS — I872 Venous insufficiency (chronic) (peripheral): Secondary | ICD-10-CM | POA: Diagnosis not present

## 2015-04-06 DIAGNOSIS — Z9842 Cataract extraction status, left eye: Secondary | ICD-10-CM | POA: Diagnosis not present

## 2015-04-06 DIAGNOSIS — M1990 Unspecified osteoarthritis, unspecified site: Secondary | ICD-10-CM | POA: Insufficient documentation

## 2015-04-06 DIAGNOSIS — Z96649 Presence of unspecified artificial hip joint: Secondary | ICD-10-CM | POA: Insufficient documentation

## 2015-04-06 DIAGNOSIS — I83012 Varicose veins of right lower extremity with ulcer of calf: Secondary | ICD-10-CM | POA: Diagnosis not present

## 2015-04-06 DIAGNOSIS — E669 Obesity, unspecified: Secondary | ICD-10-CM | POA: Insufficient documentation

## 2015-04-06 DIAGNOSIS — I890 Lymphedema, not elsewhere classified: Secondary | ICD-10-CM | POA: Diagnosis not present

## 2015-04-06 DIAGNOSIS — Z9841 Cataract extraction status, right eye: Secondary | ICD-10-CM | POA: Insufficient documentation

## 2015-04-06 DIAGNOSIS — Z87891 Personal history of nicotine dependence: Secondary | ICD-10-CM | POA: Diagnosis not present

## 2015-04-06 DIAGNOSIS — Z90710 Acquired absence of both cervix and uterus: Secondary | ICD-10-CM | POA: Diagnosis not present

## 2015-04-06 DIAGNOSIS — L97211 Non-pressure chronic ulcer of right calf limited to breakdown of skin: Secondary | ICD-10-CM | POA: Diagnosis not present

## 2015-04-06 DIAGNOSIS — Z79899 Other long term (current) drug therapy: Secondary | ICD-10-CM | POA: Insufficient documentation

## 2015-04-06 DIAGNOSIS — X58XXXA Exposure to other specified factors, initial encounter: Secondary | ICD-10-CM | POA: Diagnosis not present

## 2015-04-06 DIAGNOSIS — Y999 Unspecified external cause status: Secondary | ICD-10-CM | POA: Insufficient documentation

## 2015-04-06 DIAGNOSIS — I96 Gangrene, not elsewhere classified: Secondary | ICD-10-CM | POA: Insufficient documentation

## 2015-04-06 HISTORY — PX: I&D EXTREMITY: SHX5045

## 2015-04-06 SURGERY — IRRIGATION AND DEBRIDEMENT EXTREMITY
Anesthesia: General | Laterality: Right | Wound class: Clean Contaminated

## 2015-04-06 MED ORDER — EPHEDRINE SULFATE 50 MG/ML IJ SOLN
INTRAMUSCULAR | Status: DC | PRN
  Administered 2015-04-06: 10 mg via INTRAVENOUS
  Administered 2015-04-06: 5 mg via INTRAVENOUS
  Administered 2015-04-06: 10 mg via INTRAVENOUS

## 2015-04-06 MED ORDER — LIDOCAINE-EPINEPHRINE (PF) 1 %-1:200000 IJ SOLN
INTRAMUSCULAR | Status: AC
  Filled 2015-04-06: qty 30

## 2015-04-06 MED ORDER — CEFAZOLIN SODIUM-DEXTROSE 2-3 GM-% IV SOLR
INTRAVENOUS | Status: AC
  Administered 2015-04-06: 2 g via INTRAVENOUS
  Filled 2015-04-06: qty 50

## 2015-04-06 MED ORDER — LACTATED RINGERS IV SOLN
INTRAVENOUS | Status: DC
  Administered 2015-04-06 (×3): via INTRAVENOUS

## 2015-04-06 MED ORDER — MIDAZOLAM HCL 5 MG/5ML IJ SOLN
INTRAMUSCULAR | Status: DC | PRN
  Administered 2015-04-06: 1 mg via INTRAVENOUS

## 2015-04-06 MED ORDER — PHENYLEPHRINE HCL 10 MG/ML IJ SOLN
INTRAMUSCULAR | Status: DC | PRN
  Administered 2015-04-06 (×2): 100 ug via INTRAVENOUS

## 2015-04-06 MED ORDER — FENTANYL CITRATE (PF) 100 MCG/2ML IJ SOLN
INTRAMUSCULAR | Status: DC | PRN
  Administered 2015-04-06 (×2): 50 ug via INTRAVENOUS

## 2015-04-06 MED ORDER — FAMOTIDINE 20 MG PO TABS
20.0000 mg | ORAL_TABLET | Freq: Once | ORAL | Status: AC
  Administered 2015-04-06: 20 mg via ORAL

## 2015-04-06 MED ORDER — ETOMIDATE 2 MG/ML IV SOLN
INTRAVENOUS | Status: AC
  Filled 2015-04-06: qty 10

## 2015-04-06 MED ORDER — ONDANSETRON HCL 4 MG/2ML IJ SOLN
INTRAMUSCULAR | Status: DC | PRN
  Administered 2015-04-06: 4 mg via INTRAVENOUS

## 2015-04-06 MED ORDER — HYDROCODONE-ACETAMINOPHEN 5-325 MG PO TABS: 1.0000 | ORAL_TABLET | Freq: Four times a day (QID) | ORAL | Status: DC | PRN

## 2015-04-06 MED ORDER — DEXAMETHASONE SODIUM PHOSPHATE 10 MG/ML IJ SOLN
INTRAMUSCULAR | Status: DC | PRN
  Administered 2015-04-06: 5 mg via INTRAVENOUS

## 2015-04-06 MED ORDER — CEFAZOLIN SODIUM-DEXTROSE 2-3 GM-% IV SOLR
2.0000 g | INTRAVENOUS | Status: AC
  Administered 2015-04-06: 2 g via INTRAVENOUS

## 2015-04-06 MED ORDER — FAMOTIDINE 20 MG PO TABS
ORAL_TABLET | ORAL | Status: AC
  Administered 2015-04-06: 20 mg via ORAL
  Filled 2015-04-06: qty 1

## 2015-04-06 MED ORDER — ETOMIDATE 2 MG/ML IV SOLN
INTRAVENOUS | Status: DC | PRN
  Administered 2015-04-06: 6 mg via INTRAVENOUS
  Administered 2015-04-06: 10 mg via INTRAVENOUS

## 2015-04-06 SURGICAL SUPPLY — 29 items
BNDG COHESIVE 6X5 TAN STRL LF (GAUZE/BANDAGES/DRESSINGS) ×3 IMPLANT
BRUSH SCRUB 4% CHG (MISCELLANEOUS) ×3 IMPLANT
CANISTER SUCT 1200ML W/VALVE (MISCELLANEOUS) ×3 IMPLANT
CLEANER CAUTERY TIP 5X5 PAD (MISCELLANEOUS) ×1 IMPLANT
DRSG MEPITEL 4X7.2 (GAUZE/BANDAGES/DRESSINGS) ×6 IMPLANT
DRSG VAC ATS MED SENSATRAC (GAUZE/BANDAGES/DRESSINGS) ×3 IMPLANT
DURAPREP 26ML APPLICATOR (WOUND CARE) IMPLANT
GAUZE SPONGE 4X4 12PLY STRL (GAUZE/BANDAGES/DRESSINGS) IMPLANT
GAUZE STRETCH 2X75IN STRL (MISCELLANEOUS) IMPLANT
GLOVE SURG SYN 8.0 (GLOVE) ×3 IMPLANT
GOWN STRL REUS W/ TWL LRG LVL3 (GOWN DISPOSABLE) ×2 IMPLANT
GOWN STRL REUS W/ TWL XL LVL3 (GOWN DISPOSABLE) ×1 IMPLANT
GOWN STRL REUS W/TWL LRG LVL3 (GOWN DISPOSABLE) ×4
GOWN STRL REUS W/TWL XL LVL3 (GOWN DISPOSABLE) ×2
KIT RM TURNOVER STRD PROC AR (KITS) ×3 IMPLANT
LABEL OR SOLS (LABEL) IMPLANT
MATRIX WOUND BIOCONNEKT 5X5 (Collagen) ×30 IMPLANT
NS IRRIG 500ML POUR BTL (IV SOLUTION) ×3 IMPLANT
PACK EXTREMITY ARMC (MISCELLANEOUS) ×3 IMPLANT
PAD CLEANER CAUTERY TIP 5X5 (MISCELLANEOUS) ×2
PAD GROUND ADULT SPLIT (MISCELLANEOUS) ×3 IMPLANT
PAD NEG PRESSURE SENSATRAC (MISCELLANEOUS) IMPLANT
STOCKINETTE IMPERVIOUS 9X36 MD (GAUZE/BANDAGES/DRESSINGS) ×3 IMPLANT
STOCKINETTE STRL 6IN 960660 (GAUZE/BANDAGES/DRESSINGS) IMPLANT
SUT MNCRL 4-0 (SUTURE) ×8
SUT MNCRL 4-0 27XMFL (SUTURE) ×4
SUTURE MNCRL 4-0 27XMF (SUTURE) ×4 IMPLANT
TUBING VERAFLO DUO SET (SET/KITS/TRAYS/PACK) ×3 IMPLANT
WND VAC CANISTER 500ML (MISCELLANEOUS) IMPLANT

## 2015-04-06 NOTE — Discharge Instructions (Addendum)
AMBULATORY SURGERY  °DISCHARGE INSTRUCTIONS ° ° °1) The drugs that you were given will stay in your system until tomorrow so for the next 24 hours you should not: ° °A) Drive an automobile °B) Make any legal decisions °C) Drink any alcoholic beverage ° ° °2) You may resume regular meals tomorrow.  Today it is better to start with liquids and gradually work up to solid foods. ° °You may eat anything you prefer, but it is better to start with liquids, then soup and crackers, and gradually work up to solid foods. ° ° °3) Please notify your doctor immediately if you have any unusual bleeding, trouble breathing, redness and pain at the surgery site, drainage, fever, or pain not relieved by medication. ° °4) Your post-operative visit with Dr.                     °           °     is: Date:                        Time:   ° °Please call to schedule your post-operative visit. ° °5) Additional Instructions: °6)  °

## 2015-04-06 NOTE — Op Note (Signed)
  OPERATIVE NOTE   PROCEDURE: 1. Excisional debridement right calf wound 2. Application of bio-ConneKt wound matrix xenograft right leg  PRE-OPERATIVE DIAGNOSIS: Necrotic hematoma right leg  POST-OPERATIVE DIAGNOSIS: Same  SURGEON: Renford Dills, M.D.  ANESTHESIA: general  ESTIMATED BLOOD LOSS: 25 cc  FINDING(S): 1.  Well granulated wound bed  SPECIMEN(S):  Debrided tissue was not sent to pathology  INDICATIONS:   [name] is a 74 y.o. female who presents with a large wound secondary to a necrotic hematoma of the right leg. She therefore is undergoing debridement and application of xenograft for improved wound healing  DESCRIPTION: After obtaining full informed written consent, the patient was brought back to the operating room and placed supine upon the operating table.  The patient received IV antibiotics prior to induction.  After obtaining adequate anesthesia, the patient was prepped and draped in the standard fashion appropriate time out is called.    The right calf wound is then debrided initially using a Bovie pad as an abrasive and then using a 15 blade scalpel to excise tissue. The entire surface of the wound is treated the wound measures 22 by 11 cm. Debridement tissue was passed off the field but it is not sent to pathology. The wound and debridement tissue includes the dermis as well as the subcutaneous tissues. Fascia and muscle is not involved area once the area has been thoroughly treated in the wound bed is adequately prepared  bio-ConneKt wound matrix is opened onto the back table and repaired.  bio-ConneKt xenograft is then applied to the entire surface of the wound again measuring 22 cm x 11 cm and sewn in place using interrupted 4-0 Monocryl. Once the entire surface has been covered Mepitel followed by a VAC sponge is then placed and a VAC dressing is completed with an excellent seal.  This represents 242 cm of xenograft placement  The patient  tolerated procedure well and were no immediate Complications.  COMPLICATIONS: None  CONDITION: Stable  Renford Dills, M.D. [facility] Office: [phone]   04/06/2015, 11:40 AM

## 2015-04-06 NOTE — Transfer of Care (Signed)
Immediate Anesthesia Transfer of Care Note  Patient: [name]  Procedure(s) Performed: Procedure(s) with comments: IRRIGATION AND DEBRIDEMENT EXTREMITY (Right) - Wound matrix skin graft and wound vac placement  Patient Location: PACU  Anesthesia Type:General  Level of Consciousness: awake, alert  and oriented  Airway & Oxygen Therapy: Patient Spontanous Breathing and Patient connected to face mask oxygen  Post-op Assessment: Report given to RN and Post -op Vital signs reviewed and stable  Post vital signs: Reviewed and stable  Last Vitals:  Filed Vitals:   04/06/15 1144  BP:   Pulse:   Temp: 36.9 C  Resp:     Complications: No apparent anesthesia complications

## 2015-04-06 NOTE — Anesthesia Procedure Notes (Signed)
Procedure Name: LMA Insertion Date/Time: 04/06/2015 9:52 AM Performed by: Derinda Late Pre-anesthesia Checklist: Emergency Drugs available, Patient identified, Suction available, Patient being monitored and Timeout performed Oxygen Delivery Method: Circle system utilized Preoxygenation: Pre-oxygenation with 100% oxygen Intubation Type: IV induction Ventilation: Mask ventilation without difficulty LMA: LMA inserted LMA Size: 3.5 Placement Confirmation: positive ETCO2 and breath sounds checked- equal and bilateral Tube secured with: Tape

## 2015-04-06 NOTE — Anesthesia Preprocedure Evaluation (Addendum)
Anesthesia Evaluation  Patient identified by MRN, date of birth, ID band Patient awake    Reviewed: Allergy & Precautions, NPO status   Airway Mallampati: II       Dental  (+) Edentulous Upper, Edentulous Lower   Pulmonary shortness of breath and with exertion, former smoker,     + decreased breath sounds      Cardiovascular hypertension, Pt. on home beta blockers + Orthopnea  + dysrhythmias Atrial Fibrillation  Rhythm:Regular     Neuro/Psych    GI/Hepatic negative GI ROS, Neg liver ROS,   Endo/Other  Morbid obesity  Renal/GU negative Renal ROS     Musculoskeletal   Abdominal (+) + obese,   Peds  Hematology   Anesthesia Other Findings   Reproductive/Obstetrics                            Anesthesia Physical Anesthesia Plan  ASA: IV  Anesthesia Plan: General   Post-op Pain Management:    Induction: Intravenous  Airway Management Planned: LMA  Additional Equipment:   Intra-op Plan:   Post-operative Plan: Extubation in OR  Informed Consent: I have reviewed the patients History and Physical, chart, labs and discussed the procedure including the risks, benefits and alternatives for the proposed anesthesia with the patient or authorized representative who has indicated his/her understanding and acceptance.     Plan Discussed with: CRNA  Anesthesia Plan Comments:         Anesthesia Quick Evaluation

## 2015-04-06 NOTE — OR Nursing (Signed)
Report given to Berniece Pap at [facility]

## 2015-04-06 NOTE — H&P (Signed)
[facility] History & Physical Update  The patient was interviewed and re-examined.  The patient's previous History and Physical has been reviewed and is unchanged.  There is no change in the plan of care. We plan to proceed with the scheduled procedure.  Schnier, Latina Craver, MD  04/06/2015, 9:43 AM

## 2015-04-11 NOTE — Anesthesia Postprocedure Evaluation (Signed)
  Anesthesia Post-op Note  Patient: [name]  Procedure(s) Performed: Procedure(s) with comments: IRRIGATION AND DEBRIDEMENT EXTREMITY (Right) - Wound matrix skin graft and wound vac placement  Anesthesia type:General  Patient location: PACU  Post pain: Pain level controlled  Post assessment: Post-op Vital signs reviewed, Patient's Cardiovascular Status Stable, Respiratory Function Stable, Patent Airway and No signs of Nausea or vomiting  Post vital signs: Reviewed and stable  Last Vitals:  Filed Vitals:   04/06/15 1305  BP: 141/79  Pulse: 86  Temp:   Resp:     Level of consciousness: awake, alert  and patient cooperative  Complications: No apparent anesthesia complications

## 2015-05-02 ENCOUNTER — Inpatient Hospital Stay: Payer: Medicare Other | Admitting: Family Medicine

## 2015-05-03 ENCOUNTER — Encounter: Payer: Self-pay | Admitting: Family Medicine

## 2015-05-03 ENCOUNTER — Ambulatory Visit (INDEPENDENT_AMBULATORY_CARE_PROVIDER_SITE_OTHER): Payer: Medicare Other | Admitting: Family Medicine

## 2015-05-03 VITALS — BP 120/64 | HR 62 | Wt 206.0 lb

## 2015-05-03 DIAGNOSIS — I5022 Chronic systolic (congestive) heart failure: Secondary | ICD-10-CM

## 2015-05-03 DIAGNOSIS — Z09 Encounter for follow-up examination after completed treatment for conditions other than malignant neoplasm: Secondary | ICD-10-CM | POA: Diagnosis not present

## 2015-05-03 DIAGNOSIS — I10 Essential (primary) hypertension: Secondary | ICD-10-CM | POA: Diagnosis not present

## 2015-05-03 DIAGNOSIS — I872 Venous insufficiency (chronic) (peripheral): Secondary | ICD-10-CM | POA: Diagnosis not present

## 2015-05-03 DIAGNOSIS — L03119 Cellulitis of unspecified part of limb: Secondary | ICD-10-CM

## 2015-05-03 DIAGNOSIS — L02419 Cutaneous abscess of limb, unspecified: Secondary | ICD-10-CM

## 2015-05-03 DIAGNOSIS — I890 Lymphedema, not elsewhere classified: Secondary | ICD-10-CM | POA: Diagnosis not present

## 2015-05-03 NOTE — Progress Notes (Signed)
Name: [name]   MRN: [id number]    DOB: [birth year]   Date:05/03/2015       Progress Note  Subjective  Chief Complaint  Chief Complaint  Patient presents with  . Follow-up    hospital/ needs paper filled out    HPI Comments: Patient for follow up from hospitalization.  Medical follow for hypertension, atrial fibrillation, and congestive heart failure.  Patient needs form filled out. She has hx of lymphedema and venous stasis with stasis ulcer in treatment phase. Hypertension This is a chronic problem. The current episode started more than 1 year ago. The problem has been waxing and waning since onset. The problem is controlled. Associated symptoms include anxiety. Pertinent negatives include no blurred vision, chest pain, headaches, malaise/fatigue, neck pain, palpitations or shortness of breath. There are no associated agents to hypertension. There are no known risk factors for coronary artery disease. Past treatments include beta blockers and diuretics. The current treatment provides mild improvement. There are no compliance problems.  There is no history of angina, kidney disease, CAD/MI, CVA, heart failure, left ventricular hypertrophy, PVD or retinopathy.    No problem-specific assessment & plan notes found for this encounter.   Past Medical History  Diagnosis Date  . Hypertension   . Atrial fibrillation (HCC)   . HLD (hyperlipidemia)   . Valvular heart disease   . Pulmonary HTN (HCC)   . Lower extremity edema   . HTN (hypertension) 12/18/2014  . A-fib (HCC) 12/18/2014  . Benign essential HTN 10/25/2014  . Chronic venous insufficiency 12/29/2014  . Chronic systolic heart failure (HCC) 05/18/2014    Overview:  Global ef 35%   . 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 Bilateral lower extremity edema 12/18/2014  . Cellulitis of leg, right 12/18/2014  . History of repair of hip joint 09/19/2014  . Carpal tunnel syndrome 12/30/2014  . 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 Degenerative arthritis of hip 06/08/2014    Past Surgical History  Procedure Laterality Date  . Abdominal hysterectomy    . Bilateral wrist surgery    . Left hip replacement    . Cataract surgery Bilateral   . Bedside evacuation of hematoma Right 03/16/2015    Procedure:  EVACUATION OF HEMATOMA;  Surgeon: Renford Dills, MD;  Location: [facility] ORS;  Service: Vascular;  Laterality: Right;  . Minor application of wound vac Right 03/16/2015    Procedure: MINOR APPLICATION OF WOUND VAC;  Surgeon: Renford Dills, MD;  Location: [facility] ORS;  Service: Vascular;  Laterality: Right;  . Joint replacement Left 06/2014    hip  . I&d extremity Right 04/06/2015    Procedure: IRRIGATION AND DEBRIDEMENT EXTREMITY;  Surgeon: Renford Dills, MD;  Location: [facility] ORS;  Service: Vascular;  Laterality: Right;  Wound matrix skin graft and wound vac placement    Family History  Problem Relation Age of Onset  . Heart attack Father   . Stroke Mother     Social History   Social History  . Marital Status: Widowed    Spouse Name: N/A  . Number of Children: N/A  . Years of Education: N/A   Occupational History  . Not on file.   Social History Main Topics  . Smoking status: Former Smoker    Quit date: 04/01/1984  . Smokeless tobacco: Never Used  . Alcohol Use: No  . Drug Use: No  . Sexual Activity: No   Other Topics Concern  . Not on file NORBERTA STOBAUGH Social  History Narrative    No Known Allergies   Review of Systems  Constitutional: Negative for fever, chills, weight loss and malaise/fatigue.  HENT: Negative for ear discharge, ear pain and sore throat.   Eyes: Negative for blurred vision.  Respiratory: Negative for cough, sputum production, shortness of breath and wheezing.   Cardiovascular: Negative for chest pain, palpitations and leg swelling.  Gastrointestinal: Negative for heartburn, nausea, abdominal pain, diarrhea, constipation, blood in stool and melena.  Genitourinary: Negative for  dysuria, urgency, frequency and hematuria.  Musculoskeletal: Negative for myalgias, back pain, joint pain and neck pain.  Skin: Negative for rash.  Neurological: Negative for dizziness, tingling, sensory change, focal weakness and headaches.  Endo/Heme/Allergies: Negative for environmental allergies and polydipsia. Does not bruise/bleed easily.  Psychiatric/Behavioral: Negative for depression and suicidal ideas. The patient is not nervous/anxious and does not have insomnia.      Objective  Filed Vitals:   05/03/15 1012  BP: 120/64  Pulse: 62  Weight: 206 lb (93.441 kg)    Physical Exam  Constitutional: She is well-developed, well-nourished, and in no distress. No distress.  HENT:  Head: Normocephalic and atraumatic.  Right Ear: External ear normal.  Left Ear: External ear normal.  Nose: Nose normal.  Mouth/Throat: Oropharynx is clear and moist.  Eyes: Conjunctivae and EOM are normal. Pupils are equal, round, and reactive to light. Right eye exhibits no discharge. Left eye exhibits no discharge.  Neck: Normal range of motion. Neck supple. No JVD present. No thyromegaly present.  Cardiovascular: Normal rate, regular rhythm, normal heart sounds and intact distal pulses.  Exam reveals no gallop and no friction rub.   No murmur heard. Pulmonary/Chest: Effort normal and breath sounds normal.  Abdominal: Soft. Bowel sounds are normal. She exhibits no mass. There is no tenderness. There is no guarding.  Musculoskeletal: Normal range of motion. She exhibits no edema.  Lymphadenopathy:    She has no cervical adenopathy.  Neurological: She is alert. She has normal reflexes.  Skin: Skin is warm and dry. She is not diaphoretic.  Psychiatric: Mood and affect normal.      Assessment & Plan  Problem List Items Addressed This Visit      Cardiovascular and Mediastinum   Venous insufficiency of leg    Other Visit Diagnoses    Hospital discharge follow-up    -  Primary    Cellulitis  and abscess of leg        Lymphedema of both lower extremities        Essential hypertension        Chronic systolic congestive heart failure (HCC)             Dr. Hayden Rasmusseneanna Jones [facility]  05/03/2015

## 2015-07-12 DIAGNOSIS — I4891 Unspecified atrial fibrillation: Secondary | ICD-10-CM | POA: Diagnosis not present

## 2015-07-12 DIAGNOSIS — Z9181 History of falling: Secondary | ICD-10-CM | POA: Diagnosis not present

## 2015-07-12 DIAGNOSIS — I340 Nonrheumatic mitral (valve) insufficiency: Secondary | ICD-10-CM | POA: Diagnosis not present

## 2015-07-12 DIAGNOSIS — I872 Venous insufficiency (chronic) (peripheral): Secondary | ICD-10-CM | POA: Diagnosis not present

## 2015-07-12 DIAGNOSIS — I270 Primary pulmonary hypertension: Secondary | ICD-10-CM | POA: Diagnosis not present

## 2015-07-12 DIAGNOSIS — D509 Iron deficiency anemia, unspecified: Secondary | ICD-10-CM | POA: Diagnosis not present

## 2015-07-12 DIAGNOSIS — Z87891 Personal history of nicotine dependence: Secondary | ICD-10-CM | POA: Diagnosis not present

## 2015-07-12 DIAGNOSIS — I5022 Chronic systolic (congestive) heart failure: Secondary | ICD-10-CM | POA: Diagnosis not present

## 2015-07-12 DIAGNOSIS — S8011XD Contusion of right lower leg, subsequent encounter: Secondary | ICD-10-CM | POA: Diagnosis not present

## 2015-07-14 DIAGNOSIS — I872 Venous insufficiency (chronic) (peripheral): Secondary | ICD-10-CM | POA: Diagnosis not present

## 2015-07-14 DIAGNOSIS — I270 Primary pulmonary hypertension: Secondary | ICD-10-CM | POA: Diagnosis not present

## 2015-07-14 DIAGNOSIS — I5022 Chronic systolic (congestive) heart failure: Secondary | ICD-10-CM | POA: Diagnosis not present

## 2015-07-14 DIAGNOSIS — I4891 Unspecified atrial fibrillation: Secondary | ICD-10-CM | POA: Diagnosis not present

## 2015-07-14 DIAGNOSIS — S8011XD Contusion of right lower leg, subsequent encounter: Secondary | ICD-10-CM | POA: Diagnosis not present

## 2015-07-14 DIAGNOSIS — G5602 Carpal tunnel syndrome, left upper limb: Secondary | ICD-10-CM | POA: Diagnosis not present

## 2015-07-14 DIAGNOSIS — D509 Iron deficiency anemia, unspecified: Secondary | ICD-10-CM | POA: Diagnosis not present

## 2015-07-14 DIAGNOSIS — Z87891 Personal history of nicotine dependence: Secondary | ICD-10-CM | POA: Diagnosis not present

## 2015-07-14 DIAGNOSIS — I340 Nonrheumatic mitral (valve) insufficiency: Secondary | ICD-10-CM | POA: Diagnosis not present

## 2015-07-14 DIAGNOSIS — G5601 Carpal tunnel syndrome, right upper limb: Secondary | ICD-10-CM | POA: Diagnosis not present

## 2015-07-14 DIAGNOSIS — Z9181 History of falling: Secondary | ICD-10-CM | POA: Diagnosis not present

## 2015-07-19 DIAGNOSIS — I340 Nonrheumatic mitral (valve) insufficiency: Secondary | ICD-10-CM | POA: Diagnosis not present

## 2015-07-19 DIAGNOSIS — S8011XD Contusion of right lower leg, subsequent encounter: Secondary | ICD-10-CM | POA: Diagnosis not present

## 2015-07-19 DIAGNOSIS — I270 Primary pulmonary hypertension: Secondary | ICD-10-CM | POA: Diagnosis not present

## 2015-07-19 DIAGNOSIS — I5022 Chronic systolic (congestive) heart failure: Secondary | ICD-10-CM | POA: Diagnosis not present

## 2015-07-19 DIAGNOSIS — Z87891 Personal history of nicotine dependence: Secondary | ICD-10-CM | POA: Diagnosis not present

## 2015-07-19 DIAGNOSIS — I872 Venous insufficiency (chronic) (peripheral): Secondary | ICD-10-CM | POA: Diagnosis not present

## 2015-07-19 DIAGNOSIS — I4891 Unspecified atrial fibrillation: Secondary | ICD-10-CM | POA: Diagnosis not present

## 2015-07-19 DIAGNOSIS — D509 Iron deficiency anemia, unspecified: Secondary | ICD-10-CM | POA: Diagnosis not present

## 2015-07-19 DIAGNOSIS — Z9181 History of falling: Secondary | ICD-10-CM | POA: Diagnosis not present

## 2015-07-21 DIAGNOSIS — I5022 Chronic systolic (congestive) heart failure: Secondary | ICD-10-CM | POA: Diagnosis not present

## 2015-07-21 DIAGNOSIS — I10 Essential (primary) hypertension: Secondary | ICD-10-CM | POA: Diagnosis not present

## 2015-07-21 DIAGNOSIS — I340 Nonrheumatic mitral (valve) insufficiency: Secondary | ICD-10-CM | POA: Diagnosis not present

## 2015-07-21 DIAGNOSIS — I482 Chronic atrial fibrillation: Secondary | ICD-10-CM | POA: Diagnosis not present

## 2015-07-21 DIAGNOSIS — I872 Venous insufficiency (chronic) (peripheral): Secondary | ICD-10-CM | POA: Diagnosis not present

## 2015-07-22 DIAGNOSIS — I270 Primary pulmonary hypertension: Secondary | ICD-10-CM | POA: Diagnosis not present

## 2015-07-22 DIAGNOSIS — I872 Venous insufficiency (chronic) (peripheral): Secondary | ICD-10-CM | POA: Diagnosis not present

## 2015-07-22 DIAGNOSIS — Z9181 History of falling: Secondary | ICD-10-CM | POA: Diagnosis not present

## 2015-07-22 DIAGNOSIS — I340 Nonrheumatic mitral (valve) insufficiency: Secondary | ICD-10-CM | POA: Diagnosis not present

## 2015-07-22 DIAGNOSIS — I5022 Chronic systolic (congestive) heart failure: Secondary | ICD-10-CM | POA: Diagnosis not present

## 2015-07-22 DIAGNOSIS — Z87891 Personal history of nicotine dependence: Secondary | ICD-10-CM | POA: Diagnosis not present

## 2015-07-22 DIAGNOSIS — I4891 Unspecified atrial fibrillation: Secondary | ICD-10-CM | POA: Diagnosis not present

## 2015-07-22 DIAGNOSIS — D509 Iron deficiency anemia, unspecified: Secondary | ICD-10-CM | POA: Diagnosis not present

## 2015-07-22 DIAGNOSIS — S8011XD Contusion of right lower leg, subsequent encounter: Secondary | ICD-10-CM | POA: Diagnosis not present

## 2015-07-26 DIAGNOSIS — D509 Iron deficiency anemia, unspecified: Secondary | ICD-10-CM | POA: Diagnosis not present

## 2015-07-26 DIAGNOSIS — I5022 Chronic systolic (congestive) heart failure: Secondary | ICD-10-CM | POA: Diagnosis not present

## 2015-07-26 DIAGNOSIS — Z9181 History of falling: Secondary | ICD-10-CM | POA: Diagnosis not present

## 2015-07-26 DIAGNOSIS — I4891 Unspecified atrial fibrillation: Secondary | ICD-10-CM | POA: Diagnosis not present

## 2015-07-26 DIAGNOSIS — I270 Primary pulmonary hypertension: Secondary | ICD-10-CM | POA: Diagnosis not present

## 2015-07-26 DIAGNOSIS — I872 Venous insufficiency (chronic) (peripheral): Secondary | ICD-10-CM | POA: Diagnosis not present

## 2015-07-26 DIAGNOSIS — S8011XD Contusion of right lower leg, subsequent encounter: Secondary | ICD-10-CM | POA: Diagnosis not present

## 2015-07-26 DIAGNOSIS — Z87891 Personal history of nicotine dependence: Secondary | ICD-10-CM | POA: Diagnosis not present

## 2015-07-26 DIAGNOSIS — I340 Nonrheumatic mitral (valve) insufficiency: Secondary | ICD-10-CM | POA: Diagnosis not present

## 2015-07-28 DIAGNOSIS — I890 Lymphedema, not elsewhere classified: Secondary | ICD-10-CM | POA: Diagnosis not present

## 2015-07-28 DIAGNOSIS — G5601 Carpal tunnel syndrome, right upper limb: Secondary | ICD-10-CM | POA: Diagnosis not present

## 2015-07-28 DIAGNOSIS — G5602 Carpal tunnel syndrome, left upper limb: Secondary | ICD-10-CM | POA: Diagnosis not present

## 2015-07-28 DIAGNOSIS — I83022 Varicose veins of left lower extremity with ulcer of calf: Secondary | ICD-10-CM | POA: Diagnosis not present

## 2015-07-28 DIAGNOSIS — Z4889 Encounter for other specified surgical aftercare: Secondary | ICD-10-CM | POA: Diagnosis not present

## 2015-07-28 DIAGNOSIS — L97209 Non-pressure chronic ulcer of unspecified calf with unspecified severity: Secondary | ICD-10-CM | POA: Diagnosis not present

## 2015-07-29 ENCOUNTER — Encounter: Payer: Self-pay | Admitting: Family Medicine

## 2015-07-29 ENCOUNTER — Ambulatory Visit (INDEPENDENT_AMBULATORY_CARE_PROVIDER_SITE_OTHER): Payer: Medicare Other | Admitting: Family Medicine

## 2015-07-29 VITALS — BP 110/62 | HR 68 | Wt 206.0 lb

## 2015-07-29 DIAGNOSIS — L259 Unspecified contact dermatitis, unspecified cause: Secondary | ICD-10-CM

## 2015-07-29 DIAGNOSIS — B029 Zoster without complications: Secondary | ICD-10-CM | POA: Diagnosis not present

## 2015-07-29 DIAGNOSIS — B019 Varicella without complication: Secondary | ICD-10-CM

## 2015-07-29 DIAGNOSIS — L03317 Cellulitis of buttock: Secondary | ICD-10-CM

## 2015-07-29 MED ORDER — CLOTRIMAZOLE-BETAMETHASONE 1-0.05 % EX CREA: 1.0000 "application " | TOPICAL_CREAM | Freq: Two times a day (BID) | CUTANEOUS | Status: AC

## 2015-07-29 MED ORDER — CEPHALEXIN 500 MG PO CAPS: 500.0000 mg | ORAL_CAPSULE | Freq: Four times a day (QID) | ORAL | Status: DC

## 2015-07-29 MED ORDER — ACYCLOVIR 200 MG PO CAPS: 200.0000 mg | ORAL_CAPSULE | Freq: Every day | ORAL | Status: DC

## 2015-07-29 NOTE — Progress Notes (Signed)
Name: [name]   MRN: [id number]    DOB: [birth year]   Date:07/29/2015       Progress Note  Subjective  Chief Complaint  Chief Complaint  Patient presents with  . Rash    on buttocks    Rash This is a new problem. The current episode started in the past 7 days (2 days). The problem has been gradually worsening since onset. The affected locations include the right buttock. The rash is characterized by blistering, redness and swelling. She was exposed to nothing. Pertinent negatives include no congestion, cough, diarrhea, fever, joint pain, shortness of breath or sore throat. Past treatments include anti-itch cream. The treatment provided no relief.    No problem-specific assessment & plan notes found for this encounter.   Past Medical History  Diagnosis Date  . Hypertension   . Atrial fibrillation (HCC)   . HLD (hyperlipidemia)   . Valvular heart disease   . Pulmonary HTN (HCC)   . Lower extremity edema   . HTN (hypertension) 12/18/2014  . A-fib (HCC) 12/18/2014  . Benign essential HTN 10/25/2014  . Chronic venous insufficiency 12/29/2014  . Chronic systolic heart failure (HCC) 05/18/2014    Overview:  Global ef 35%   . 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 Bilateral lower extremity edema 12/18/2014  . Cellulitis of leg, right 12/18/2014  . History of repair of hip joint 09/19/2014  . Carpal tunnel syndrome 12/30/2014  . 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 Degenerative arthritis of hip 06/08/2014    Past Surgical History  Procedure Laterality Date  . Abdominal hysterectomy    . Bilateral wrist surgery    . Left hip replacement    . Cataract surgery Bilateral   . Bedside evacuation of hematoma Right 03/16/2015    Procedure:  EVACUATION OF HEMATOMA;  Surgeon: Renford Dills, MD;  Location: [facility];  Service: Vascular;  Laterality: Right;  . Minor application of wound vac Right 03/16/2015    Procedure: MINOR APPLICATION OF WOUND VAC;  Surgeon: Renford Dills,  MD;  Location: [facility];  Service: Vascular;  Laterality: Right;  . Joint replacement Left 06/2014    hip  . I&d extremity Right 04/06/2015    Procedure: IRRIGATION AND DEBRIDEMENT EXTREMITY;  Surgeon: Renford Dills, MD;  Location: [facility];  Service: Vascular;  Laterality: Right;  Wound matrix skin graft and wound vac placement    Family History  Problem Relation Age of Onset  . Heart attack Father   . Stroke Mother     Social History   Social History  . Marital Status: Widowed    Spouse Name: N/A  . Number of Children: N/A  . Years of Education: N/A   Occupational History  . Not on file.   Social History Main Topics  . Smoking status: Former Smoker    Quit date: 04/01/1984  . Smokeless tobacco: Never Used  . Alcohol Use: No  . Drug Use: No  . Sexual Activity: No   Other Topics Concern  . Not on file   Social History Narrative    No Known Allergies   Review of Systems  Constitutional: Negative for fever, chills, weight loss and malaise/fatigue.  HENT: Negative for congestion, ear discharge, ear pain and sore throat.   Eyes: Negative for blurred vision.  Respiratory: Negative for cough, sputum production, shortness of breath and wheezing.   Cardiovascular: Negative for chest pain, palpitations and leg swelling.  Gastrointestinal: Negative for heartburn, nausea, abdominal pain,  diarrhea, constipation, blood in stool and melena.  Genitourinary: Negative for dysuria, urgency, frequency and hematuria.  Musculoskeletal: Negative for myalgias, back pain, joint pain and neck pain.  Skin: Positive for rash.  Neurological: Negative for dizziness, tingling, sensory change, focal weakness and headaches.  Endo/Heme/Allergies: Negative for environmental allergies and polydipsia. Does not bruise/bleed easily.  Psychiatric/Behavioral: Negative for depression and suicidal ideas. The patient is not nervous/anxious and does not have insomnia.      Objective  Filed Vitals:    07/29/15 1349  BP: 110/62  Pulse: 68  Weight: 206 lb (93.441 kg)    Physical Exam  Constitutional: She is well-developed, well-nourished, and in no distress. No distress.  HENT:  Head: Normocephalic and atraumatic.  Right Ear: External ear normal.  Left Ear: External ear normal.  Nose: Nose normal.  Mouth/Throat: Oropharynx is clear and moist.  Eyes: Conjunctivae and EOM are normal. Pupils are equal, round, and reactive to light. Right eye exhibits no discharge. Left eye exhibits no discharge.  Neck: Normal range of motion. Neck supple. No JVD present. No thyromegaly present.  Cardiovascular: Normal rate, regular rhythm, normal heart sounds and intact distal pulses.  Exam reveals no gallop and no friction rub.   No murmur heard. Pulmonary/Chest: Effort normal and breath sounds normal.  Abdominal: Soft. Bowel sounds are normal. She exhibits no mass. There is no tenderness. There is no guarding.  Musculoskeletal: Normal range of motion. She exhibits no edema.  Lymphadenopathy:    She has no cervical adenopathy.  Neurological: She is alert. She has normal reflexes.  Skin: Skin is warm and dry. Rash noted. Rash is vesicular. She is not diaphoretic. There is erythema.  Psychiatric: Mood and affect normal.  Nursing note and vitals reviewed.     Assessment & Plan  Problem List Items Addressed This Visit    None    Visit Diagnoses    Varicella-zoster    -  Primary    Relevant Medications    cephALEXin (KEFLEX) 500 MG capsule    clotrimazole-betamethasone (LOTRISONE) cream    acyclovir (ZOVIRAX) 200 MG capsule    Cellulitis of buttock        Relevant Medications    cephALEXin (KEFLEX) 500 MG capsule    Contact dermatitis        Relevant Medications    clotrimazole-betamethasone (LOTRISONE) cream         Dr. [facility]  07/29/2015

## 2015-08-01 DIAGNOSIS — B351 Tinea unguium: Secondary | ICD-10-CM | POA: Diagnosis not present

## 2015-08-01 DIAGNOSIS — M79674 Pain in right toe(s): Secondary | ICD-10-CM | POA: Diagnosis not present

## 2015-08-01 DIAGNOSIS — M79675 Pain in left toe(s): Secondary | ICD-10-CM | POA: Diagnosis not present

## 2015-08-02 DIAGNOSIS — I5022 Chronic systolic (congestive) heart failure: Secondary | ICD-10-CM | POA: Diagnosis not present

## 2015-08-02 DIAGNOSIS — I270 Primary pulmonary hypertension: Secondary | ICD-10-CM | POA: Diagnosis not present

## 2015-08-02 DIAGNOSIS — I340 Nonrheumatic mitral (valve) insufficiency: Secondary | ICD-10-CM | POA: Diagnosis not present

## 2015-08-02 DIAGNOSIS — Z87891 Personal history of nicotine dependence: Secondary | ICD-10-CM | POA: Diagnosis not present

## 2015-08-02 DIAGNOSIS — Z9181 History of falling: Secondary | ICD-10-CM | POA: Diagnosis not present

## 2015-08-02 DIAGNOSIS — I4891 Unspecified atrial fibrillation: Secondary | ICD-10-CM | POA: Diagnosis not present

## 2015-08-02 DIAGNOSIS — D509 Iron deficiency anemia, unspecified: Secondary | ICD-10-CM | POA: Diagnosis not present

## 2015-08-02 DIAGNOSIS — S8011XD Contusion of right lower leg, subsequent encounter: Secondary | ICD-10-CM | POA: Diagnosis not present

## 2015-08-02 DIAGNOSIS — I872 Venous insufficiency (chronic) (peripheral): Secondary | ICD-10-CM | POA: Diagnosis not present

## 2015-08-04 DIAGNOSIS — D509 Iron deficiency anemia, unspecified: Secondary | ICD-10-CM | POA: Diagnosis not present

## 2015-08-04 DIAGNOSIS — I340 Nonrheumatic mitral (valve) insufficiency: Secondary | ICD-10-CM | POA: Diagnosis not present

## 2015-08-04 DIAGNOSIS — S8011XD Contusion of right lower leg, subsequent encounter: Secondary | ICD-10-CM | POA: Diagnosis not present

## 2015-08-04 DIAGNOSIS — I5022 Chronic systolic (congestive) heart failure: Secondary | ICD-10-CM | POA: Diagnosis not present

## 2015-08-04 DIAGNOSIS — I872 Venous insufficiency (chronic) (peripheral): Secondary | ICD-10-CM | POA: Diagnosis not present

## 2015-08-04 DIAGNOSIS — Z9181 History of falling: Secondary | ICD-10-CM | POA: Diagnosis not present

## 2015-08-04 DIAGNOSIS — I270 Primary pulmonary hypertension: Secondary | ICD-10-CM | POA: Diagnosis not present

## 2015-08-04 DIAGNOSIS — I4891 Unspecified atrial fibrillation: Secondary | ICD-10-CM | POA: Diagnosis not present

## 2015-08-04 DIAGNOSIS — Z87891 Personal history of nicotine dependence: Secondary | ICD-10-CM | POA: Diagnosis not present

## 2015-08-09 DIAGNOSIS — Z9181 History of falling: Secondary | ICD-10-CM | POA: Diagnosis not present

## 2015-08-09 DIAGNOSIS — I872 Venous insufficiency (chronic) (peripheral): Secondary | ICD-10-CM | POA: Diagnosis not present

## 2015-08-09 DIAGNOSIS — Z87891 Personal history of nicotine dependence: Secondary | ICD-10-CM | POA: Diagnosis not present

## 2015-08-09 DIAGNOSIS — S8011XD Contusion of right lower leg, subsequent encounter: Secondary | ICD-10-CM | POA: Diagnosis not present

## 2015-08-09 DIAGNOSIS — D509 Iron deficiency anemia, unspecified: Secondary | ICD-10-CM | POA: Diagnosis not present

## 2015-08-09 DIAGNOSIS — I4891 Unspecified atrial fibrillation: Secondary | ICD-10-CM | POA: Diagnosis not present

## 2015-08-09 DIAGNOSIS — I340 Nonrheumatic mitral (valve) insufficiency: Secondary | ICD-10-CM | POA: Diagnosis not present

## 2015-08-09 DIAGNOSIS — I270 Primary pulmonary hypertension: Secondary | ICD-10-CM | POA: Diagnosis not present

## 2015-08-09 DIAGNOSIS — I5022 Chronic systolic (congestive) heart failure: Secondary | ICD-10-CM | POA: Diagnosis not present

## 2015-08-11 DIAGNOSIS — I5022 Chronic systolic (congestive) heart failure: Secondary | ICD-10-CM | POA: Diagnosis not present

## 2015-08-11 DIAGNOSIS — S8011XD Contusion of right lower leg, subsequent encounter: Secondary | ICD-10-CM | POA: Diagnosis not present

## 2015-08-11 DIAGNOSIS — I4891 Unspecified atrial fibrillation: Secondary | ICD-10-CM | POA: Diagnosis not present

## 2015-08-11 DIAGNOSIS — I340 Nonrheumatic mitral (valve) insufficiency: Secondary | ICD-10-CM | POA: Diagnosis not present

## 2015-08-11 DIAGNOSIS — I270 Primary pulmonary hypertension: Secondary | ICD-10-CM | POA: Diagnosis not present

## 2015-08-11 DIAGNOSIS — Z87891 Personal history of nicotine dependence: Secondary | ICD-10-CM | POA: Diagnosis not present

## 2015-08-11 DIAGNOSIS — D509 Iron deficiency anemia, unspecified: Secondary | ICD-10-CM | POA: Diagnosis not present

## 2015-08-11 DIAGNOSIS — Z9181 History of falling: Secondary | ICD-10-CM | POA: Diagnosis not present

## 2015-08-11 DIAGNOSIS — I872 Venous insufficiency (chronic) (peripheral): Secondary | ICD-10-CM | POA: Diagnosis not present

## 2015-08-15 DIAGNOSIS — I340 Nonrheumatic mitral (valve) insufficiency: Secondary | ICD-10-CM | POA: Diagnosis not present

## 2015-08-15 DIAGNOSIS — S8011XD Contusion of right lower leg, subsequent encounter: Secondary | ICD-10-CM | POA: Diagnosis not present

## 2015-08-15 DIAGNOSIS — I5022 Chronic systolic (congestive) heart failure: Secondary | ICD-10-CM | POA: Diagnosis not present

## 2015-08-15 DIAGNOSIS — I872 Venous insufficiency (chronic) (peripheral): Secondary | ICD-10-CM | POA: Diagnosis not present

## 2015-08-15 DIAGNOSIS — I270 Primary pulmonary hypertension: Secondary | ICD-10-CM | POA: Diagnosis not present

## 2015-08-15 DIAGNOSIS — Z87891 Personal history of nicotine dependence: Secondary | ICD-10-CM | POA: Diagnosis not present

## 2015-08-15 DIAGNOSIS — D509 Iron deficiency anemia, unspecified: Secondary | ICD-10-CM | POA: Diagnosis not present

## 2015-08-15 DIAGNOSIS — I4891 Unspecified atrial fibrillation: Secondary | ICD-10-CM | POA: Diagnosis not present

## 2015-08-15 DIAGNOSIS — Z9181 History of falling: Secondary | ICD-10-CM | POA: Diagnosis not present

## 2015-08-19 DIAGNOSIS — Z9181 History of falling: Secondary | ICD-10-CM | POA: Diagnosis not present

## 2015-08-19 DIAGNOSIS — I872 Venous insufficiency (chronic) (peripheral): Secondary | ICD-10-CM | POA: Diagnosis not present

## 2015-08-19 DIAGNOSIS — D509 Iron deficiency anemia, unspecified: Secondary | ICD-10-CM | POA: Diagnosis not present

## 2015-08-19 DIAGNOSIS — Z87891 Personal history of nicotine dependence: Secondary | ICD-10-CM | POA: Diagnosis not present

## 2015-08-19 DIAGNOSIS — I340 Nonrheumatic mitral (valve) insufficiency: Secondary | ICD-10-CM | POA: Diagnosis not present

## 2015-08-19 DIAGNOSIS — I270 Primary pulmonary hypertension: Secondary | ICD-10-CM | POA: Diagnosis not present

## 2015-08-19 DIAGNOSIS — I4891 Unspecified atrial fibrillation: Secondary | ICD-10-CM | POA: Diagnosis not present

## 2015-08-19 DIAGNOSIS — I5022 Chronic systolic (congestive) heart failure: Secondary | ICD-10-CM | POA: Diagnosis not present

## 2015-08-19 DIAGNOSIS — S8011XD Contusion of right lower leg, subsequent encounter: Secondary | ICD-10-CM | POA: Diagnosis not present

## 2015-08-22 ENCOUNTER — Encounter: Payer: Self-pay | Admitting: Family Medicine

## 2015-08-22 ENCOUNTER — Ambulatory Visit (INDEPENDENT_AMBULATORY_CARE_PROVIDER_SITE_OTHER): Payer: Medicare Other | Admitting: Family Medicine

## 2015-08-22 VITALS — BP 120/76 | HR 76 | Ht 67.0 in | Wt 206.0 lb

## 2015-08-22 DIAGNOSIS — N3001 Acute cystitis with hematuria: Secondary | ICD-10-CM

## 2015-08-22 LAB — POCT URINALYSIS DIPSTICK
BILIRUBIN UA: NEGATIVE
GLUCOSE UA: NEGATIVE
KETONES UA: NEGATIVE
Nitrite, UA: NEGATIVE
SPEC GRAV UA: 1.02
Urobilinogen, UA: 0.2
pH, UA: 6

## 2015-08-22 MED ORDER — SULFAMETHOXAZOLE-TRIMETHOPRIM 800-160 MG PO TABS: 1.0000 | ORAL_TABLET | Freq: Two times a day (BID) | ORAL | Status: DC

## 2015-08-22 NOTE — Progress Notes (Signed)
Name: [name]   MRN: [id number]    DOB: 22-Apr-1941   Date:08/22/2015       Progress Note  Subjective  Chief Complaint  Chief Complaint  Patient presents with  . Vaginal Bleeding    started 4 days ago- has some abdominal pressure and hurts at end of stream of urination    Vaginal Bleeding The patient's primary symptoms include pelvic pain and vaginal bleeding. The patient's pertinent negatives include no genital itching, genital lesions, genital odor, genital rash or vaginal discharge. Primary symptoms comment: noted clots/also some mild urine leakage. This is a new problem. The current episode started in the past 7 days. The problem occurs 2 to 4 times per day. The problem has been gradually improving. The pain is mild. She is not pregnant. Associated symptoms include abdominal pain, constipation, dysuria, frequency and urgency. Pertinent negatives include no anorexia, back pain, chills, diarrhea, discolored urine, fever, headaches, hematuria, joint pain, nausea, rash or sore throat. The vaginal discharge was bloody. The vaginal bleeding is spotting. She has been passing clots.    No problem-specific assessment & plan notes found for this encounter.   Past Medical History  Diagnosis Date  . Hypertension   . Atrial fibrillation (HCC)   . HLD (hyperlipidemia)   . Valvular heart disease   . Pulmonary HTN (HCC)   . Lower extremity edema   . HTN (hypertension) 12/18/2014  . A-fib (HCC) 12/18/2014  . Benign essential HTN 10/25/2014  . Chronic venous insufficiency 12/29/2014  . Chronic systolic heart failure (HCC) 05/18/2014    Overview:  Global ef 35%   . 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 Bilateral lower extremity edema 12/18/2014  . Cellulitis of leg, right 12/18/2014  . History of repair of hip joint 09/19/2014  . Carpal tunnel syndrome 12/30/2014  . 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 Degenerative arthritis of hip 06/08/2014    Past Surgical History  Procedure  Laterality Date  . Abdominal hysterectomy    . Bilateral wrist surgery    . Left hip replacement    . Cataract surgery Bilateral   . Bedside evacuation of hematoma Right 03/16/2015    Procedure:  EVACUATION OF HEMATOMA;  Surgeon: Renford Dills, MD;  Location: [facility];  Service: Vascular;  Laterality: Right;  . Minor application of wound vac Right 03/16/2015    Procedure: MINOR APPLICATION OF WOUND VAC;  Surgeon: Renford Dills, MD;  Location: [facility];  Service: Vascular;  Laterality: Right;  . Joint replacement Left 06/2014    hip  . I&d extremity Right 04/06/2015    Procedure: IRRIGATION AND DEBRIDEMENT EXTREMITY;  Surgeon: Renford Dills, MD;  Location: [facility];  Service: Vascular;  Laterality: Right;  Wound matrix skin graft and wound vac placement    Family History  Problem Relation Age of Onset  . Heart attack Father   . Stroke Mother     Social History   Social History  . Marital Status: Unknown    Spouse Name: N/A  . Number of Children: N/A  . Years of Education: N/A   Occupational History  . Not on file.   Social History Main Topics  . Smoking status: Former Smoker    Quit date: 04/01/1984  . Smokeless tobacco: Never Used  . Alcohol Use: No  . Drug Use: No  . Sexual Activity: No   Other Topics Concern  . Not on file   Social History Narrative    No Known Allergies  Review of Systems  Constitutional: Negative for fever, chills, weight loss and malaise/fatigue.  HENT: Negative for ear discharge, ear pain and sore throat.   Eyes: Negative for blurred vision.  Respiratory: Negative for cough, sputum production, shortness of breath and wheezing.   Cardiovascular: Negative for chest pain, palpitations and leg swelling.  Gastrointestinal: Positive for abdominal pain and constipation. Negative for heartburn, nausea, diarrhea, blood in stool, melena and anorexia.  Genitourinary: Positive for dysuria, urgency, frequency, vaginal bleeding and pelvic pain.  Negative for hematuria and vaginal discharge.  Musculoskeletal: Negative for myalgias, back pain, joint pain and neck pain.  Skin: Negative for rash.  Neurological: Negative for dizziness, tingling, sensory change, focal weakness and headaches.  Endo/Heme/Allergies: Negative for environmental allergies and polydipsia. Does not bruise/bleed easily.  Psychiatric/Behavioral: Negative for depression and suicidal ideas. The patient is not nervous/anxious and does not have insomnia.      Objective  Filed Vitals:   08/22/15 1012  BP: 120/76  Pulse: 76  Height:  (1.702 m)  Weight: 206 lb (93.441 kg)    Physical Exam  Constitutional: She is well-developed, well-nourished, and in no distress. No distress.  HENT:  Head: Normocephalic and atraumatic.  Right Ear: External ear normal.  Left Ear: External ear normal.  Nose: Nose normal.  Mouth/Throat: Oropharynx is clear and moist.  Eyes: Conjunctivae and EOM are normal. Pupils are equal, round, and reactive to light. Right eye exhibits no discharge. Left eye exhibits no discharge.  Neck: Normal range of motion. Neck supple. No JVD present. No thyromegaly present.  Cardiovascular: Normal rate, regular rhythm, normal heart sounds and intact distal pulses.  Exam reveals no gallop and no friction rub.   No murmur heard. Pulmonary/Chest: Effort normal and breath sounds normal.  Abdominal: Soft. Bowel sounds are normal. She exhibits no mass. There is tenderness. There is no guarding.  Mild suprapubic tenderness  Musculoskeletal: Normal range of motion. She exhibits no edema.  Lymphadenopathy:    She has no cervical adenopathy.  Neurological: She is alert. She has normal reflexes.  Skin: Skin is warm and dry. She is not diaphoretic.  Psychiatric: Mood and affect normal.  Nursing note and vitals reviewed.     Assessment & Plan  Problem List Items Addressed This Visit    None    Visit Diagnoses    Acute cystitis with hematuria    -   Primary    Relevant Medications    sulfamethoxazole-trimethoprim (BACTRIM DS,SEPTRA DS) 800-160 MG tablet    Other Relevant Orders    POCT Urinalysis Dipstick (Completed)         Dr. [facility]  08/22/2015

## 2015-08-23 DIAGNOSIS — I872 Venous insufficiency (chronic) (peripheral): Secondary | ICD-10-CM | POA: Diagnosis not present

## 2015-08-23 DIAGNOSIS — D509 Iron deficiency anemia, unspecified: Secondary | ICD-10-CM | POA: Diagnosis not present

## 2015-08-23 DIAGNOSIS — I270 Primary pulmonary hypertension: Secondary | ICD-10-CM | POA: Diagnosis not present

## 2015-08-23 DIAGNOSIS — I340 Nonrheumatic mitral (valve) insufficiency: Secondary | ICD-10-CM | POA: Diagnosis not present

## 2015-08-23 DIAGNOSIS — S8011XD Contusion of right lower leg, subsequent encounter: Secondary | ICD-10-CM | POA: Diagnosis not present

## 2015-08-23 DIAGNOSIS — Z9181 History of falling: Secondary | ICD-10-CM | POA: Diagnosis not present

## 2015-08-23 DIAGNOSIS — I5022 Chronic systolic (congestive) heart failure: Secondary | ICD-10-CM | POA: Diagnosis not present

## 2015-08-23 DIAGNOSIS — I4891 Unspecified atrial fibrillation: Secondary | ICD-10-CM | POA: Diagnosis not present

## 2015-08-23 DIAGNOSIS — Z87891 Personal history of nicotine dependence: Secondary | ICD-10-CM | POA: Diagnosis not present

## 2015-08-25 DIAGNOSIS — I4891 Unspecified atrial fibrillation: Secondary | ICD-10-CM | POA: Diagnosis not present

## 2015-08-25 DIAGNOSIS — I340 Nonrheumatic mitral (valve) insufficiency: Secondary | ICD-10-CM | POA: Diagnosis not present

## 2015-08-25 DIAGNOSIS — I5022 Chronic systolic (congestive) heart failure: Secondary | ICD-10-CM | POA: Diagnosis not present

## 2015-08-25 DIAGNOSIS — Z87891 Personal history of nicotine dependence: Secondary | ICD-10-CM | POA: Diagnosis not present

## 2015-08-25 DIAGNOSIS — Z9181 History of falling: Secondary | ICD-10-CM | POA: Diagnosis not present

## 2015-08-25 DIAGNOSIS — D509 Iron deficiency anemia, unspecified: Secondary | ICD-10-CM | POA: Diagnosis not present

## 2015-08-25 DIAGNOSIS — I270 Primary pulmonary hypertension: Secondary | ICD-10-CM | POA: Diagnosis not present

## 2015-08-25 DIAGNOSIS — S8011XD Contusion of right lower leg, subsequent encounter: Secondary | ICD-10-CM | POA: Diagnosis not present

## 2015-08-25 DIAGNOSIS — I872 Venous insufficiency (chronic) (peripheral): Secondary | ICD-10-CM | POA: Diagnosis not present

## 2015-08-29 DIAGNOSIS — L97209 Non-pressure chronic ulcer of unspecified calf with unspecified severity: Secondary | ICD-10-CM | POA: Diagnosis not present

## 2015-08-29 DIAGNOSIS — I890 Lymphedema, not elsewhere classified: Secondary | ICD-10-CM | POA: Diagnosis not present

## 2015-08-29 DIAGNOSIS — M7989 Other specified soft tissue disorders: Secondary | ICD-10-CM | POA: Diagnosis not present

## 2015-09-01 ENCOUNTER — Ambulatory Visit (INDEPENDENT_AMBULATORY_CARE_PROVIDER_SITE_OTHER): Payer: Medicare Other | Admitting: Family Medicine

## 2015-09-01 ENCOUNTER — Encounter: Payer: Self-pay | Admitting: Family Medicine

## 2015-09-01 VITALS — BP 120/80 | HR 80 | Ht 66.0 in | Wt 205.0 lb

## 2015-09-01 DIAGNOSIS — D509 Iron deficiency anemia, unspecified: Secondary | ICD-10-CM | POA: Diagnosis not present

## 2015-09-01 DIAGNOSIS — I4891 Unspecified atrial fibrillation: Secondary | ICD-10-CM | POA: Diagnosis not present

## 2015-09-01 DIAGNOSIS — Z9181 History of falling: Secondary | ICD-10-CM | POA: Diagnosis not present

## 2015-09-01 DIAGNOSIS — N3001 Acute cystitis with hematuria: Secondary | ICD-10-CM | POA: Diagnosis not present

## 2015-09-01 DIAGNOSIS — I5022 Chronic systolic (congestive) heart failure: Secondary | ICD-10-CM | POA: Diagnosis not present

## 2015-09-01 DIAGNOSIS — I340 Nonrheumatic mitral (valve) insufficiency: Secondary | ICD-10-CM | POA: Diagnosis not present

## 2015-09-01 DIAGNOSIS — I872 Venous insufficiency (chronic) (peripheral): Secondary | ICD-10-CM | POA: Diagnosis not present

## 2015-09-01 DIAGNOSIS — Z87891 Personal history of nicotine dependence: Secondary | ICD-10-CM | POA: Diagnosis not present

## 2015-09-01 DIAGNOSIS — S8011XD Contusion of right lower leg, subsequent encounter: Secondary | ICD-10-CM | POA: Diagnosis not present

## 2015-09-01 DIAGNOSIS — I270 Primary pulmonary hypertension: Secondary | ICD-10-CM | POA: Diagnosis not present

## 2015-09-01 LAB — POCT URINALYSIS DIPSTICK
BILIRUBIN UA: NEGATIVE
Glucose, UA: NEGATIVE
Ketones, UA: NEGATIVE
NITRITE UA: NEGATIVE
PH UA: 6
Protein, UA: NEGATIVE
Spec Grav, UA: 1.01
UROBILINOGEN UA: 0.2

## 2015-09-01 MED ORDER — CIPROFLOXACIN HCL 250 MG PO TABS: 250.0000 mg | ORAL_TABLET | Freq: Two times a day (BID) | ORAL | Status: DC

## 2015-09-01 NOTE — Progress Notes (Signed)
Name: [name]   MRN: [id number]    DOB: [birth year]   Date:09/01/2015       Progress Note  Subjective  Chief Complaint  Chief Complaint  Patient presents with  . Follow-up    finished 10 day course of med- has not seen any blood in 2 days    Urinary Tract Infection  This is a new problem. The current episode started 1 to 4 weeks ago (patient  with history of hysterectomy/ and unilateral oophorectomy). The problem occurs intermittently. The problem has been waxing and waning. The quality of the pain is described as aching. The pain is mild. There has been no fever. She is not sexually active. There is no history of pyelonephritis. Pertinent negatives include no chills, discharge, flank pain, frequency, hematuria, hesitancy, nausea, sweats, urgency or vomiting. Associated symptoms comments: Pressure discomfort. She has tried nothing for the symptoms. The treatment provided mild relief. There is no history of catheterization, kidney stones, recurrent UTIs, urinary stasis or a urological procedure.    No problem-specific assessment & plan notes found for this encounter.   Past Medical History  Diagnosis Date  . Hypertension   . Atrial fibrillation (HCC)   . HLD (hyperlipidemia)   . Valvular heart disease   . Pulmonary HTN (HCC)   . Lower extremity edema   . HTN (hypertension) 12/18/2014  . A-fib (HCC) 12/18/2014  . Benign essential HTN 10/25/2014  . Chronic venous insufficiency 12/29/2014  . Chronic systolic heart failure (HCC) 05/18/2014    Overview:  Global ef 35%   . 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 Bilateral lower extremity edema 12/18/2014  . Cellulitis of leg, right 12/18/2014  . History of repair of hip joint 09/19/2014  . Carpal tunnel syndrome 12/30/2014  . 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 Degenerative arthritis of hip 06/08/2014    Past Surgical History  Procedure Laterality Date  . Abdominal hysterectomy    . Bilateral wrist surgery    . Left hip  replacement    . Cataract surgery Bilateral   . Bedside evacuation of hematoma Right 03/16/2015    Procedure:  EVACUATION OF HEMATOMA;  Surgeon: Renford Dills, MD;  Location: [facility];  Service: Vascular;  Laterality: Right;  . Minor application of wound vac Right 03/16/2015    Procedure: MINOR APPLICATION OF WOUND VAC;  Surgeon: Renford Dills, MD;  Location: [facility];  Service: Vascular;  Laterality: Right;  . Joint replacement Left 06/2014    hip  . I&d extremity Right 04/06/2015    Procedure: IRRIGATION AND DEBRIDEMENT EXTREMITY;  Surgeon: Renford Dills, MD;  Location: [facility];  Service: Vascular;  Laterality: Right;  Wound matrix skin graft and wound vac placement    Family History  Problem Relation Age of Onset  . Heart attack Father   . Stroke Mother     Social History   Social History  . Marital Status: Unknown    Spouse Name: N/A  . Number of Children: N/A  . Years of Education: N/A   Occupational History  . Not on file.   Social History Main Topics  . Smoking status: Former Smoker    Quit date: 04/01/1984  . Smokeless tobacco: Never Used  . Alcohol Use: No  . Drug Use: No  . Sexual Activity: No   Other Topics Concern  . Not on file   Social History Narrative    No Known Allergies   Review of Systems  Constitutional: Negative for  fever, chills, weight loss and malaise/fatigue.  HENT: Negative for ear discharge, ear pain and sore throat.   Eyes: Negative for blurred vision.  Respiratory: Negative for cough, sputum production, shortness of breath and wheezing.   Cardiovascular: Negative for chest pain, palpitations and leg swelling.  Gastrointestinal: Negative for heartburn, nausea, vomiting, abdominal pain, diarrhea, constipation, blood in stool and melena.  Genitourinary: Negative for dysuria, hesitancy, urgency, frequency, hematuria and flank pain.  Musculoskeletal: Negative for myalgias, back pain, joint pain and neck pain.  Skin: Negative for  rash.  Neurological: Negative for dizziness, tingling, sensory change, focal weakness and headaches.  Endo/Heme/Allergies: Negative for environmental allergies and polydipsia. Does not bruise/bleed easily.  Psychiatric/Behavioral: Negative for depression and suicidal ideas. The patient is not nervous/anxious and does not have insomnia.      Objective  Filed Vitals:   09/01/15 1337  BP: 120/80  Pulse: 80  Height:  (1.676 m)  Weight: 205 lb (92.987 kg)    Physical Exam  Constitutional: She is well-developed, well-nourished, and in no distress. No distress.  HENT:  Head: Normocephalic and atraumatic.  Right Ear: External ear normal.  Left Ear: External ear normal.  Nose: Nose normal.  Mouth/Throat: Oropharynx is clear and moist.  Eyes: Conjunctivae and EOM are normal. Pupils are equal, round, and reactive to light. Right eye exhibits no discharge. Left eye exhibits no discharge.  Neck: Normal range of motion. Neck supple. No JVD present. No thyromegaly present.  Cardiovascular: Normal rate, regular rhythm, normal heart sounds and intact distal pulses.  Exam reveals no gallop and no friction rub.   No murmur heard. Pulmonary/Chest: Effort normal and breath sounds normal.  Abdominal: Soft. Bowel sounds are normal. She exhibits no mass. There is no tenderness. There is no guarding.  Musculoskeletal: Normal range of motion. She exhibits no edema.  Lymphadenopathy:    She has no cervical adenopathy.  Neurological: She is alert. She has normal reflexes.  Skin: Skin is warm and dry. She is not diaphoretic.  Psychiatric: Mood and affect normal.  Nursing note and vitals reviewed.     Assessment & Plan  Problem List Items Addressed This Visit    None    Visit Diagnoses    Acute cystitis with hematuria    -  Primary    Relevant Medications    ciprofloxacin (CIPRO) 250 MG tablet    Other Relevant Orders    POCT Urinalysis Dipstick (Completed)         Dr. [facility]  09/01/2015

## 2015-09-06 DIAGNOSIS — I270 Primary pulmonary hypertension: Secondary | ICD-10-CM | POA: Diagnosis not present

## 2015-09-06 DIAGNOSIS — I5022 Chronic systolic (congestive) heart failure: Secondary | ICD-10-CM | POA: Diagnosis not present

## 2015-09-06 DIAGNOSIS — I872 Venous insufficiency (chronic) (peripheral): Secondary | ICD-10-CM | POA: Diagnosis not present

## 2015-09-06 DIAGNOSIS — Z9181 History of falling: Secondary | ICD-10-CM | POA: Diagnosis not present

## 2015-09-06 DIAGNOSIS — I4891 Unspecified atrial fibrillation: Secondary | ICD-10-CM | POA: Diagnosis not present

## 2015-09-06 DIAGNOSIS — Z87891 Personal history of nicotine dependence: Secondary | ICD-10-CM | POA: Diagnosis not present

## 2015-09-06 DIAGNOSIS — I340 Nonrheumatic mitral (valve) insufficiency: Secondary | ICD-10-CM | POA: Diagnosis not present

## 2015-09-06 DIAGNOSIS — S8011XD Contusion of right lower leg, subsequent encounter: Secondary | ICD-10-CM | POA: Diagnosis not present

## 2015-09-06 DIAGNOSIS — D509 Iron deficiency anemia, unspecified: Secondary | ICD-10-CM | POA: Diagnosis not present

## 2015-09-08 DIAGNOSIS — I4891 Unspecified atrial fibrillation: Secondary | ICD-10-CM | POA: Diagnosis not present

## 2015-09-08 DIAGNOSIS — I270 Primary pulmonary hypertension: Secondary | ICD-10-CM | POA: Diagnosis not present

## 2015-09-08 DIAGNOSIS — S8011XD Contusion of right lower leg, subsequent encounter: Secondary | ICD-10-CM | POA: Diagnosis not present

## 2015-09-08 DIAGNOSIS — D509 Iron deficiency anemia, unspecified: Secondary | ICD-10-CM | POA: Diagnosis not present

## 2015-09-08 DIAGNOSIS — Z9181 History of falling: Secondary | ICD-10-CM | POA: Diagnosis not present

## 2015-09-08 DIAGNOSIS — I340 Nonrheumatic mitral (valve) insufficiency: Secondary | ICD-10-CM | POA: Diagnosis not present

## 2015-09-08 DIAGNOSIS — I872 Venous insufficiency (chronic) (peripheral): Secondary | ICD-10-CM | POA: Diagnosis not present

## 2015-09-08 DIAGNOSIS — I5022 Chronic systolic (congestive) heart failure: Secondary | ICD-10-CM | POA: Diagnosis not present

## 2015-09-08 DIAGNOSIS — Z87891 Personal history of nicotine dependence: Secondary | ICD-10-CM | POA: Diagnosis not present

## 2015-09-12 DIAGNOSIS — I872 Venous insufficiency (chronic) (peripheral): Secondary | ICD-10-CM | POA: Diagnosis not present

## 2015-09-12 DIAGNOSIS — I5022 Chronic systolic (congestive) heart failure: Secondary | ICD-10-CM | POA: Diagnosis not present

## 2015-09-12 DIAGNOSIS — I340 Nonrheumatic mitral (valve) insufficiency: Secondary | ICD-10-CM | POA: Diagnosis not present

## 2015-09-12 DIAGNOSIS — D509 Iron deficiency anemia, unspecified: Secondary | ICD-10-CM | POA: Diagnosis not present

## 2015-09-12 DIAGNOSIS — I270 Primary pulmonary hypertension: Secondary | ICD-10-CM | POA: Diagnosis not present

## 2015-09-12 DIAGNOSIS — S8011XD Contusion of right lower leg, subsequent encounter: Secondary | ICD-10-CM | POA: Diagnosis not present

## 2015-09-12 DIAGNOSIS — Z87891 Personal history of nicotine dependence: Secondary | ICD-10-CM | POA: Diagnosis not present

## 2015-09-12 DIAGNOSIS — I4891 Unspecified atrial fibrillation: Secondary | ICD-10-CM | POA: Diagnosis not present

## 2015-09-12 DIAGNOSIS — Z9181 History of falling: Secondary | ICD-10-CM | POA: Diagnosis not present

## 2015-09-15 DIAGNOSIS — I270 Primary pulmonary hypertension: Secondary | ICD-10-CM | POA: Diagnosis not present

## 2015-09-15 DIAGNOSIS — Z9181 History of falling: Secondary | ICD-10-CM | POA: Diagnosis not present

## 2015-09-15 DIAGNOSIS — I5022 Chronic systolic (congestive) heart failure: Secondary | ICD-10-CM | POA: Diagnosis not present

## 2015-09-15 DIAGNOSIS — D509 Iron deficiency anemia, unspecified: Secondary | ICD-10-CM | POA: Diagnosis not present

## 2015-09-15 DIAGNOSIS — I340 Nonrheumatic mitral (valve) insufficiency: Secondary | ICD-10-CM | POA: Diagnosis not present

## 2015-09-15 DIAGNOSIS — I872 Venous insufficiency (chronic) (peripheral): Secondary | ICD-10-CM | POA: Diagnosis not present

## 2015-09-15 DIAGNOSIS — Z87891 Personal history of nicotine dependence: Secondary | ICD-10-CM | POA: Diagnosis not present

## 2015-09-15 DIAGNOSIS — S8011XD Contusion of right lower leg, subsequent encounter: Secondary | ICD-10-CM | POA: Diagnosis not present

## 2015-09-15 DIAGNOSIS — I4891 Unspecified atrial fibrillation: Secondary | ICD-10-CM | POA: Diagnosis not present

## 2015-09-19 DIAGNOSIS — I872 Venous insufficiency (chronic) (peripheral): Secondary | ICD-10-CM | POA: Diagnosis not present

## 2015-09-19 DIAGNOSIS — Z9181 History of falling: Secondary | ICD-10-CM | POA: Diagnosis not present

## 2015-09-19 DIAGNOSIS — S8011XD Contusion of right lower leg, subsequent encounter: Secondary | ICD-10-CM | POA: Diagnosis not present

## 2015-09-19 DIAGNOSIS — I340 Nonrheumatic mitral (valve) insufficiency: Secondary | ICD-10-CM | POA: Diagnosis not present

## 2015-09-19 DIAGNOSIS — Z87891 Personal history of nicotine dependence: Secondary | ICD-10-CM | POA: Diagnosis not present

## 2015-09-19 DIAGNOSIS — I270 Primary pulmonary hypertension: Secondary | ICD-10-CM | POA: Diagnosis not present

## 2015-09-19 DIAGNOSIS — D509 Iron deficiency anemia, unspecified: Secondary | ICD-10-CM | POA: Diagnosis not present

## 2015-09-19 DIAGNOSIS — I4891 Unspecified atrial fibrillation: Secondary | ICD-10-CM | POA: Diagnosis not present

## 2015-09-19 DIAGNOSIS — I5022 Chronic systolic (congestive) heart failure: Secondary | ICD-10-CM | POA: Diagnosis not present

## 2015-09-20 ENCOUNTER — Other Ambulatory Visit: Payer: Self-pay

## 2015-09-20 DIAGNOSIS — D509 Iron deficiency anemia, unspecified: Secondary | ICD-10-CM | POA: Diagnosis not present

## 2015-09-20 DIAGNOSIS — I872 Venous insufficiency (chronic) (peripheral): Secondary | ICD-10-CM | POA: Diagnosis not present

## 2015-09-20 DIAGNOSIS — Z87891 Personal history of nicotine dependence: Secondary | ICD-10-CM | POA: Diagnosis not present

## 2015-09-20 DIAGNOSIS — I272 Other secondary pulmonary hypertension: Secondary | ICD-10-CM | POA: Diagnosis not present

## 2015-09-20 DIAGNOSIS — S8011XD Contusion of right lower leg, subsequent encounter: Secondary | ICD-10-CM | POA: Diagnosis not present

## 2015-09-20 DIAGNOSIS — I110 Hypertensive heart disease with heart failure: Secondary | ICD-10-CM | POA: Diagnosis not present

## 2015-09-20 DIAGNOSIS — Z9181 History of falling: Secondary | ICD-10-CM | POA: Diagnosis not present

## 2015-09-20 DIAGNOSIS — I5022 Chronic systolic (congestive) heart failure: Secondary | ICD-10-CM | POA: Diagnosis not present

## 2015-09-20 DIAGNOSIS — I4891 Unspecified atrial fibrillation: Secondary | ICD-10-CM | POA: Diagnosis not present

## 2015-09-20 DIAGNOSIS — I340 Nonrheumatic mitral (valve) insufficiency: Secondary | ICD-10-CM | POA: Diagnosis not present

## 2015-09-21 DIAGNOSIS — I5022 Chronic systolic (congestive) heart failure: Secondary | ICD-10-CM | POA: Diagnosis not present

## 2015-09-21 DIAGNOSIS — I340 Nonrheumatic mitral (valve) insufficiency: Secondary | ICD-10-CM | POA: Diagnosis not present

## 2015-09-21 DIAGNOSIS — Z87891 Personal history of nicotine dependence: Secondary | ICD-10-CM | POA: Diagnosis not present

## 2015-09-21 DIAGNOSIS — S8011XD Contusion of right lower leg, subsequent encounter: Secondary | ICD-10-CM | POA: Diagnosis not present

## 2015-09-21 DIAGNOSIS — I110 Hypertensive heart disease with heart failure: Secondary | ICD-10-CM | POA: Diagnosis not present

## 2015-09-21 DIAGNOSIS — I4891 Unspecified atrial fibrillation: Secondary | ICD-10-CM | POA: Diagnosis not present

## 2015-09-21 DIAGNOSIS — I872 Venous insufficiency (chronic) (peripheral): Secondary | ICD-10-CM | POA: Diagnosis not present

## 2015-09-21 DIAGNOSIS — I272 Other secondary pulmonary hypertension: Secondary | ICD-10-CM | POA: Diagnosis not present

## 2015-09-21 DIAGNOSIS — D509 Iron deficiency anemia, unspecified: Secondary | ICD-10-CM | POA: Diagnosis not present

## 2015-09-21 DIAGNOSIS — Z9181 History of falling: Secondary | ICD-10-CM | POA: Diagnosis not present

## 2015-09-23 DIAGNOSIS — D509 Iron deficiency anemia, unspecified: Secondary | ICD-10-CM | POA: Diagnosis not present

## 2015-09-23 DIAGNOSIS — I340 Nonrheumatic mitral (valve) insufficiency: Secondary | ICD-10-CM | POA: Diagnosis not present

## 2015-09-23 DIAGNOSIS — I5022 Chronic systolic (congestive) heart failure: Secondary | ICD-10-CM | POA: Diagnosis not present

## 2015-09-23 DIAGNOSIS — I4891 Unspecified atrial fibrillation: Secondary | ICD-10-CM | POA: Diagnosis not present

## 2015-09-23 DIAGNOSIS — S8011XD Contusion of right lower leg, subsequent encounter: Secondary | ICD-10-CM | POA: Diagnosis not present

## 2015-09-23 DIAGNOSIS — Z87891 Personal history of nicotine dependence: Secondary | ICD-10-CM | POA: Diagnosis not present

## 2015-09-23 DIAGNOSIS — I110 Hypertensive heart disease with heart failure: Secondary | ICD-10-CM | POA: Diagnosis not present

## 2015-09-23 DIAGNOSIS — Z9181 History of falling: Secondary | ICD-10-CM | POA: Diagnosis not present

## 2015-09-23 DIAGNOSIS — I872 Venous insufficiency (chronic) (peripheral): Secondary | ICD-10-CM | POA: Diagnosis not present

## 2015-09-23 DIAGNOSIS — I272 Other secondary pulmonary hypertension: Secondary | ICD-10-CM | POA: Diagnosis not present

## 2015-09-26 ENCOUNTER — Encounter: Payer: Medicare Other | Attending: Surgery | Admitting: Surgery

## 2015-09-26 DIAGNOSIS — Z87891 Personal history of nicotine dependence: Secondary | ICD-10-CM | POA: Insufficient documentation

## 2015-09-26 DIAGNOSIS — E663 Overweight: Secondary | ICD-10-CM | POA: Insufficient documentation

## 2015-09-26 DIAGNOSIS — L97212 Non-pressure chronic ulcer of right calf with fat layer exposed: Secondary | ICD-10-CM | POA: Diagnosis not present

## 2015-09-26 DIAGNOSIS — I87032 Postthrombotic syndrome with ulcer and inflammation of left lower extremity: Secondary | ICD-10-CM | POA: Insufficient documentation

## 2015-09-26 DIAGNOSIS — L97221 Non-pressure chronic ulcer of left calf limited to breakdown of skin: Secondary | ICD-10-CM | POA: Diagnosis not present

## 2015-09-26 DIAGNOSIS — L97222 Non-pressure chronic ulcer of left calf with fat layer exposed: Secondary | ICD-10-CM | POA: Diagnosis not present

## 2015-09-26 DIAGNOSIS — I87031 Postthrombotic syndrome with ulcer and inflammation of right lower extremity: Secondary | ICD-10-CM | POA: Insufficient documentation

## 2015-09-26 DIAGNOSIS — I10 Essential (primary) hypertension: Secondary | ICD-10-CM | POA: Diagnosis not present

## 2015-09-26 DIAGNOSIS — I890 Lymphedema, not elsewhere classified: Secondary | ICD-10-CM | POA: Diagnosis not present

## 2015-09-26 DIAGNOSIS — L97211 Non-pressure chronic ulcer of right calf limited to breakdown of skin: Secondary | ICD-10-CM | POA: Diagnosis not present

## 2015-09-26 DIAGNOSIS — Z86718 Personal history of other venous thrombosis and embolism: Secondary | ICD-10-CM | POA: Diagnosis not present

## 2015-09-26 DIAGNOSIS — I83022 Varicose veins of left lower extremity with ulcer of calf: Secondary | ICD-10-CM | POA: Diagnosis not present

## 2015-09-26 DIAGNOSIS — I87033 Postthrombotic syndrome with ulcer and inflammation of bilateral lower extremity: Secondary | ICD-10-CM | POA: Diagnosis not present

## 2015-09-26 DIAGNOSIS — L97209 Non-pressure chronic ulcer of unspecified calf with unspecified severity: Secondary | ICD-10-CM | POA: Diagnosis not present

## 2015-09-26 DIAGNOSIS — Z4889 Encounter for other specified surgical aftercare: Secondary | ICD-10-CM | POA: Diagnosis not present

## 2015-09-27 NOTE — Progress Notes (Signed)
[name], Chandi L. ([id number]) Visit Report for 09/26/2015 Abuse/Suicide Risk Screen Details Patient Name: [name], [name] 09/26/2015 3:00 Date of Service: PM Medical Record [id number] Number: Patient Account Number: [id number] October 18, 1940 (75 y.o. Treating RN: Leonard Downingoseboro, Sendra Date of Birth/Sex: Female) Other Clinician: Primary Care Physician: Elizabeth SauerJones, Deanna Treating Britto, Errol Referring Physician: Levora DredgeSCHNIER, GREGORY Physician/Extender: Tania AdeWeeks in Treatment: 0 Abuse/Suicide Risk Screen Items Answer ABUSE/SUICIDE RISK SCREEN: Has anyone close to you tried to hurt or harm you recentlyo No Do you feel uncomfortable with anyone in your familyo No Has anyone forced you do things that you didnot want to doo No Do you have any thoughts of harming yourselfo No Patient displays signs or symptoms of abuse and/or neglect. No Electronic Signature(s) Signed: 09/26/2015 4:16:25 PM By: Lucrezia Starchoseboro, RN, Sendra Entered By: Lucrezia Starchoseboro, RN, Sendra on 09/26/2015 15:02:39 Recine, Levora AngelANNIE L. ([id number]) -------------------------------------------------------------------------------- Activities of Daily Living Details Patient Name: [name], [name] 09/26/2015 3:00 Date of Service: PM Medical Record [id number] Number: Patient Account Number: [id number] October 18, 1940 (74 y.o. Treating RN: Leonard Downingoseboro, Sendra Date of Birth/Sex: Female) Other Clinician: Primary Care Physician: Elizabeth SauerJones, Deanna Treating Evlyn KannerBritto, Errol Referring Physician: Levora DredgeSCHNIER, GREGORY Physician/Extender: Tania AdeWeeks in Treatment: 0 Activities of Daily Living Items Answer Activities of Daily Living (Please select one for each item) Drive Automobile Completely Able Take Medications Completely Able Use Telephone Completely Able Care for Appearance Completely Able Use Toilet Completely Able Bath / Shower Completely Able Dress Self Completely Able Feed Self Completely Able Walk Completely Able Get In / Out Bed Completely Able Housework Completely  Able Prepare Meals Completely Able Handle Money Completely Able Shop for Self Completely Able Electronic Signature(s) Signed: 09/26/2015 4:16:25 PM By: Lucrezia Starchoseboro, RN, Sendra Entered By: Lucrezia Starchoseboro, RN, Sendra on 09/26/2015 15:03:03 [name], [name] ([id number]) -------------------------------------------------------------------------------- Education Assessment Details Patient Name: [name], [name] 09/26/2015 3:00 Date of Service: PM Medical Record [id number] Number: Patient Account Number: [id number] October 18, 1940 (74 y.o. Treating RN: Leonard Downing[place]boro, Sendra Date of Birth/Sex: Female) Other Clinician: Primary Care Physician: Elizabeth SauerJones, Deanna Treating Evlyn KannerBritto, Errol Referring Physician: Levora DredgeSCHNIER, GREGORY Physician/Extender: Tania AdeWeeks in Treatment: 0 Primary Learner Assessed: Patient Learning Preferences/Education Level/Primary Language Learning Preference: Explanation Highest Education Level: High School Preferred Language: English Cognitive Barrier Assessment/Beliefs Language Barrier: No Translator Needed: No Memory Deficit: No Emotional Barrier: No Cultural/Religious Beliefs Affecting Medical No Care: Physical Barrier Assessment Impaired Vision: Yes Glasses, reading Impaired Hearing: No Decreased Hand dexterity: Yes Limitations: burning/tingling Knowledge/Comprehension Assessment Knowledge Level: Medium Comprehension Level: Medium Ability to understand written Medium instructions: Ability to understand verbal Medium instructions: Motivation Assessment Anxiety Level: Calm Cooperation: Cooperative Education Importance: Acknowledges Need Interest in Health Problems: Asks Questions Perception: Coherent Willingness to Engage in Self- High Management Activities: Readiness to Engage in Self- High Management Activities: [name], [name] ([id number]) Electronic Signature(s) Signed: 09/26/2015 4:16:25 PM By: Lucrezia Starchoseboro, RN, Sendra Entered By: Lucrezia Starch[place]boro, RN, Sendra on 09/26/2015  15:04:36 Morikawa, Levora AngelANNIE L. ([id number]) -------------------------------------------------------------------------------- Fall Risk Assessment Details Patient Name: [name], [name] 09/26/2015 3:00 Date of Service: PM Medical Record [id number] Number: Patient Account Number: [id number] October 18, 1940 (74 y.o. Treating RN: Leonard Downingoseboro, Sendra Date of Birth/Sex: Female) Other Clinician: Primary Care Physician: Elizabeth SauerJones, Deanna Treating Evlyn KannerBritto, Errol Referring Physician: Levora DredgeSCHNIER, GREGORY Physician/Extender: Tania AdeWeeks in Treatment: 0 Fall Risk Assessment Items Have you had 2 or more falls in the last 12 monthso 0 No Have you had any fall that resulted in injury in the last 12 monthso 0 No FALL RISK ASSESSMENT: History of falling - immediate or within 3 months 0 No Secondary diagnosis 15 Yes Ambulatory aid  None/bed rest/wheelchair/nurse 0 No Crutches/cane/walker 15 Yes Furniture 0 No IV Access/Saline Lock 0 No Gait/Training Normal/bed rest/immobile 0 No Weak 10 Yes Impaired 0 No Mental Status Oriented to own ability 0 Yes Electronic Signature(s) Signed: 09/26/2015 4:16:25 PM By: Lucrezia Starch, RN, Sendra Entered By: Lucrezia Starch RN, Sendra on 09/26/2015 15:03:24 Marie Maples ([id number]) -------------------------------------------------------------------------------- Foot Assessment Details Patient Name: ROSALIN, [name] 09/26/2015 3:00 Date of Service: PM Medical Record [id number] Number: Patient Account Number: [id number] [birth year] (74 y.o. Treating RN: Leonard Downing Date of Birth/Sex: Female) Other Clinician: Primary Care Physician: Elizabeth Sauer Treating Britto, Errol Referring Physician: Levora Dredge Physician/Extender: Tania Ade in Treatment: 0 Foot Assessment Items Site Locations + = Sensation present, - = Sensation absent, C = Callus, U = Ulcer R = Redness, W = Warmth, M = Maceration, PU = Pre-ulcerative lesion F = Fissure, S = Swelling, D = Dryness Assessment Right:  Left: Other Deformity: No No Prior Foot Ulcer: No No Prior Amputation: No No Charcot Joint: No No Ambulatory Status: Ambulatory With Help Assistance Device: Walker Gait: Steady Electronic Signature(s) Signed: 09/26/2015 4:16:25 PM By: Lucrezia Starch, RN, Sendra Entered By: Lucrezia Starch RN, Sendra on 09/26/2015 15:27:30 Gerling, Levora Angel ([id number]) ZETHA, KUHAR ([id number]) -------------------------------------------------------------------------------- Nutrition Risk Assessment Details Patient Name: BRENDALIZ, KUK 09/26/2015 3:00 Date of Service: PM Medical Record [id number] Number: Patient Account Number: [id number] 01-08-1941 (74 y.o. Treating RN: Leonard Downing Date of Birth/Sex: Female) Other Clinician: Primary Care Physician: Elizabeth Sauer Treating Evlyn Kanner Referring Physician: Levora Dredge Physician/Extender: Tania Ade in Treatment: 0 Height (in): Weight (lbs): Body Mass Index (BMI): Nutrition Risk Assessment Items NUTRITION RISK SCREEN: I have an illness or condition that made me change the kind and/or 0 No amount of food I eat I eat fewer than two meals per day 0 No I eat few fruits and vegetables, or milk products 0 No I have three or more drinks of beer, liquor or wine almost every day 0 No I have tooth or mouth problems that make it hard for me to eat 0 No I don't always have enough money to buy the food I need 0 No I eat alone most of the time 0 No I take three or more different prescribed or over-the-counter drugs a 1 Yes day Without wanting to, I have lost or gained 10 pounds in the last six 0 No months I am not always physically able to shop, cook and/or feed myself 0 No Nutrition Protocols Good Risk Protocol 0 No interventions needed Moderate Risk Protocol Electronic Signature(s) Signed: 09/26/2015 4:16:25 PM By: Lucrezia Starch RN, Sendra Entered By: Lucrezia Starch RN, Sendra on 09/26/2015 15:03:42

## 2015-09-27 NOTE — Progress Notes (Signed)
[name], [name] ([id number]) Visit Report for 09/26/2015 Allergy List Details Patient Name: [name], [name] 09/26/2015 3:00 Date of Service: PM Medical Record [id number] Number: Patient Account Number: [id number] 06-Oct-1940 (75 y.o. Treating RN: Leonard Downingoseboro, Sendra Date of Birth/Sex: Female) Other Clinician: Primary Care Physician: Elizabeth SauerJones, Deanna Treating Evlyn KannerBritto, Errol Referring Physician: Levora DredgeSCHNIER, GREGORY Physician/Extender: Tania AdeWeeks in Treatment: 0 Allergies Active Allergies NKDA Allergy Notes Electronic Signature(s) Signed: 09/26/2015 4:16:25 PM By: Lucrezia Starchoseboro, RN, Sendra Entered By: Lucrezia Starchoseboro, RN, Sendra [name] 09/26/2015 15:02:12 [name], [name] ([id number]) -------------------------------------------------------------------------------- Arrival Information Details Patient Name: [name], [name] 09/26/2015 3:00 Date of Service: PM Medical Record [id number] Number: Patient Account Number: [id number] 06-Oct-1940 (75 y.o. Treating RN: Leonard Downingoseboro, Sendra Date of Birth/Sex: Female) Other Clinician: Primary Care Physician: Elizabeth SauerJones, Deanna Treating Evlyn KannerBritto, Errol Referring Physician: Levora DredgeSCHNIER, GREGORY Physician/Extender: Tania AdeWeeks in Treatment: 0 Visit Information Patient Arrived: Walker Arrival Time: 15:01 Accompanied By: self Transfer Assistance: None Patient Identification Verified: Yes Secondary Verification Process Yes Completed: Patient Has Alerts: Yes Patient Alerts: ABI: (R)1.03 done at [facility] ABI: (L)10.2 done at [facility] Electronic Signature(s) Signed: 09/26/2015 4:16:25 PM By: Lucrezia Starch[place]boro, RN, Sendra Entered By: Lucrezia Starch[place]boro, RN, Sendra [name] 09/26/2015 15:24:02 [name], [name] ([id number]) -------------------------------------------------------------------------------- Clinic Level of Care Assessment Details Patient Name: [name], [name] 09/26/2015 3:00 Date of Service: PM Medical Record [id number] Number: Patient Account Number: [id number] 06-Oct-1940 (75 y.o. Treating RN: Leonard Downingoseboro,  Sendra Date of Birth/Sex: Female) Other Clinician: Primary Care Physician: Elizabeth SauerJones, Deanna Treating Evlyn KannerBritto, Errol Referring Physician: Levora DredgeSCHNIER, GREGORY Physician/Extender: Tania AdeWeeks in Treatment: 0 Clinic Level of Care Assessment Items TOOL 1 Quantity Score X - Use when EandM and Procedure is performed [name] INITIAL visit 1 0 ASSESSMENTS - Nursing Assessment / Reassessment X - General Physical Exam (combine w/ comprehensive assessment (listed just 1 20 below) when performed [name] new pt. evals) X - Comprehensive Assessment (HX, ROS, Risk Assessments, Wounds Hx, etc.) 1 25 ASSESSMENTS - Wound and Skin Assessment / Reassessment []  - Dermatologic / Skin Assessment (not related to wound area) 0 ASSESSMENTS - Ostomy and/or Continence Assessment and Care []  - Incontinence Assessment and Management 0 []  - Ostomy Care Assessment and Management (repouching, etc.) 0 PROCESS - Coordination of Care X - Simple Patient / Family Education for ongoing care 1 15 []  - Complex (extensive) Patient / Family Education for ongoing care 0 X - Staff obtains ChiropractorConsents, Records, Test Results / Process Orders 1 10 X - Staff telephones HHA, Nursing Homes / Clarify orders / etc 1 10 []  - Routine Transfer to another Facility (non-emergent condition) 0 []  - Routine Hospital Admission (non-emergent condition) 0 []  - New Admissions / Manufacturing engineernsurance Authorizations / Ordering NPWT, Apligraf, etc. 0 []  - Emergency Hospital Admission (emergent condition) 0 PROCESS - Special Needs []  - Pediatric / Minor Patient Management 0 [name], [name] ([id number]) []  - Isolation Patient Management 0 []  - Hearing / Language / Visual special needs 0 []  - Assessment of Community assistance (transportation, D/C planning, etc.) 0 []  - Additional assistance / Altered mentation 0 []  - Support Surface(s) Assessment (bed, cushion, seat, etc.) 0 INTERVENTIONS - Miscellaneous []  - External ear exam 0 []  - Patient Transfer (multiple staff / Nurse, adultHoyer Lift /  Similar devices) 0 []  - Simple Staple / Suture removal (25 or less) 0 []  - Complex Staple / Suture removal (26 or more) 0 []  - Hypo/Hyperglycemic Management (do not check if billed separately) 0 []  - Ankle / Brachial Index (ABI) - do not check if billed separately 0 Has the patient been seen at  the hospital within the last three years: Yes Total Score: 80 Level Of Care: New/Established - Level 3 Electronic Signature(s) Signed: 09/26/2015 4:16:25 PM By: Lucrezia Starch, RN, Sendra Entered By: Lucrezia Starch RN, Sendra [name] 09/26/2015 16:12:05 [name] ([id number]) -------------------------------------------------------------------------------- Encounter Discharge Information Details Patient Name: [name] 09/26/2015 3:00 Date of Service: PM Medical Record [id number] Number: Patient Account Number: [id number] [birth year] (75 y.o. Treating RN: Leonard Downing Date of Birth/Sex: Female) Other Clinician: Primary Care Physician: Elizabeth Sauer Treating Evlyn Kanner Referring Physician: Levora Dredge Physician/Extender: Tania Ade in Treatment: 0 Encounter Discharge Information Items Discharge Pain Level: 0 Discharge Condition: Stable Ambulatory Status: Walker Discharge Destination: Home Private Transportation: Auto Accompanied By: self Schedule Follow-up Appointment: Yes Medication Reconciliation completed and Yes provided to Patient/Care [name]: Clinical Summary of Care: Electronic Signature(s) Signed: 09/26/2015 4:16:25 PM By: Lucrezia Starch RN, Sendra Entered By: Lucrezia Starch RN, Sendra [name] 09/26/2015 15:55:28 [name] ([id number]) -------------------------------------------------------------------------------- Lower Extremity Assessment Details Patient Name: KATIEANN, HUNGATE 09/26/2015 3:00 Date of Service: PM Medical Record [id number] Number: Patient Account Number: [id number] 01-26-1941 (74 y.o. Treating RN: Leonard Downing Date of Birth/Sex: Female) Other  Clinician: Primary Care Physician: Elizabeth Sauer Treating Britto, Errol Referring Physician: Levora Dredge Physician/Extender: Tania Ade in Treatment: 0 Edema Assessment Assessed: [Left: No] [Right: No] E[Left: dema] [Right: :] Calf Left: Right: Point of Measurement: 34 cm From Medial Instep 51 cm 39 cm Ankle Left: Right: Point of Measurement: 11 cm From Medial Instep 38.5 cm 37.5 cm Vascular Assessment Pulses: Posterior Tibial Dorsalis Pedis Palpable: [Left:Yes] [Right:Yes] Extremity colors, hair growth, and conditions: Extremity Color: [Left:Red] [Right:Red] Hair Growth [name] Extremity: [Left:No] [Right:No] Temperature of Extremity: [Left:Warm] [Right:Warm] Capillary Refill: [Left:< 3 seconds] [Right:< 3 seconds] Toe Nail Assessment Left: Right: Thick: No No Discolored: No No Deformed: No No Improper Length and Hygiene: No No Electronic Signature(s) Signed: 09/26/2015 4:16:25 PM By: Lucrezia Starch, RN, Sendra Entered By: Lucrezia Starch RN, Sendra [name] 09/26/2015 15:11:29 Hennon, [name] ([id number]) Marie [name] ([id number]) -------------------------------------------------------------------------------- Multi-Disciplinary Care Plan Details Patient Name: ARCELIA, PALS 09/26/2015 3:00 Date of Service: PM Medical Record [id number] Number: Patient Account Number: [id number] [birth year] (74 y.o. Treating RN: Leonard Downing Date of Birth/Sex: Female) Other Clinician: Primary Care Physician: Elizabeth Sauer Treating Evlyn Kanner Referring Physician: Levora Dredge Physician/Extender: Tania Ade in Treatment: 0 Active Inactive Orientation to the Wound Care Program Nursing Diagnoses: Knowledge deficit related to the wound healing center program Goals: Patient/caregiver will verbalize understanding of the Wound Healing Center Program Date Initiated: 09/26/2015 Goal Status: Active Interventions: Provide education [name] orientation to the wound center Notes: Venous Leg  Ulcer Nursing Diagnoses: Actual venous Insuffiency (use after diagnosis is confirmed) Goals: Patient will maintain optimal edema control Date Initiated: 09/26/2015 Goal Status: Active Patient/caregiver will verbalize understanding of disease process and disease management Date Initiated: 09/26/2015 Goal Status: Active Interventions: Assess peripheral edema status every visit. Compression as ordered Provide education [name] venous insufficiency Notes: Marie [name] ([id number]) Wound/Skin Impairment Nursing Diagnoses: Impaired tissue integrity Goals: Ulcer/skin breakdown will have a volume reduction of 30% by week 4 Date Initiated: 09/26/2015 Goal Status: Active Ulcer/skin breakdown will have a volume reduction of 50% by week 8 Date Initiated: 09/26/2015 Goal Status: Active Ulcer/skin breakdown will have a volume reduction of 80% by week 12 Date Initiated: 09/26/2015 Goal Status: Active Ulcer/skin breakdown will heal within 14 weeks Date Initiated: 09/26/2015 Goal Status: Active Interventions: Assess patient/caregiver ability to obtain necessary supplies Assess patient/caregiver ability to perform ulcer/skin care regimen upon admission and as needed Assess ulceration(s) every visit  Provide education [name] ulcer and skin care Notes: Electronic Signature(s) Signed: 09/26/2015 4:16:25 PM By: Lucrezia Starch, RN, Sendra Entered By: Lucrezia Starch RN, Sendra [name] 09/26/2015 15:29:08 [name] ([id number]) -------------------------------------------------------------------------------- Pain Assessment Details Patient Name: [name], FLAIM 09/26/2015 3:00 Date of Service: PM Medical Record [id number] Number: Patient Account Number: [id number] [birth year] (74 y.o. Treating RN: Leonard Downing Date of Birth/Sex: Female) Other Clinician: Primary Care Physician: Elizabeth Sauer Treating Evlyn Kanner Referring Physician: Levora Dredge Physician/Extender: Tania Ade in Treatment: 0 Active  Problems Location of Pain Severity and Description of Pain Patient Has Paino No Site Locations Rate the pain. Current Pain Level: 0 Pain Management and Medication Current Pain Management: Electronic Signature(s) Signed: 09/26/2015 4:16:25 PM By: Lucrezia Starch, RN, Sendra Entered By: Lucrezia Starch RN, Sendra [name] 09/26/2015 15:01:50 Molinaro, [name] ([id number]) -------------------------------------------------------------------------------- Patient/Caregiver Education Details Patient Name: LYZETTE, REINHARDT 09/26/2015 3:00 Date of Service: PM Medical Record [id number] Number: Patient Account Number: [id number] 1940/08/14 (74 y.o. Treating RN: Leonard Downing Date of Birth/Gender: Female) Other Clinician: Primary Care Physician: Elizabeth Sauer Treating Evlyn Kanner Referring Physician: Levora Dredge Physician/Extender: Tania Ade in Treatment: 0 Education Assessment Education Provided To: Patient Education Topics Provided Venous: Handouts: Controlling Swelling with Multilayered Compression Wraps Methods: Explain/Verbal Responses: State content correctly Welcome To The [facility]: Handouts: Welcome To The [facility] Methods: Explain/Verbal Responses: State content correctly Wound/Skin Impairment: Handouts: Caring for Your Ulcer, Skin Care Do's and Dont's Methods: Explain/Verbal Responses: State content correctly Electronic Signature(s) Signed: 09/26/2015 4:16:25 PM By: Lucrezia Starch RN, Sendra Entered By: Lucrezia Starch RN, Sendra [name] 09/26/2015 15:55:50 Jerkins, [name] ([id number]) -------------------------------------------------------------------------------- Wound Assessment Details Patient Name: CYARA, DEVOTO 09/26/2015 3:00 Date of Service: PM Medical Record [id number] Number: Patient Account Number: [id number] Sep 12, 1940 (74 y.o. Treating RN: Leonard Downing Date of Birth/Sex: Female) Other Clinician: Primary Care Physician: Elizabeth Sauer Treating Britto,  Errol Referring Physician: Levora Dredge Physician/Extender: Tania Ade in Treatment: 0 Wound Status Wound Number: 1 Primary Etiology: Lymphedema Wound Location: Left Lower Leg - Wound Status: Open Circumfernential Comorbid History: Cataracts, Hypertension Wounding Event: Gradually Appeared Date Acquired: 09/07/2014 Weeks Of Treatment: 0 Clustered Wound: No Photos Photo Uploaded By: Lucrezia Starch, RN, Rosalio Macadamia [name] 09/26/2015 16:09:55 Wound Measurements Length: (cm) 24 Width: (cm) 46 Depth: (cm) 0.1 Area: (cm) 867.08 Volume: (cm) 86.708 % Reduction in Area: % Reduction in Volume: Epithelialization: None Tunneling: No Undermining: No Wound Description Classification: Partial Thickness Foul Odor After Wound Margin: Flat and Intact Exudate Amount: Large Exudate Type: Serous Exudate Color: amber Cleansing: No Wound Bed Granulation Amount: Large (67-100%) Exposed Structure Granulation Quality: Pink Fascia Exposed: No [place], Allaya L. ([id number]) Necrotic Amount: None Present (0%) Fat Layer Exposed: No Tendon Exposed: No Muscle Exposed: No Joint Exposed: No Bone Exposed: No Limited to Skin Breakdown Periwound Skin Texture Texture Color No Abnormalities Noted: No No Abnormalities Noted: No Callus: No Atrophie Blanche: No Crepitus: No Cyanosis: No Excoriation: No Ecchymosis: No Fluctuance: No Erythema: No Friable: No Hemosiderin Staining: No Induration: No Mottled: No Localized Edema: No Pallor: No Rash: No Rubor: No Scarring: No Moisture No Abnormalities Noted: No Dry / Scaly: No Maceration: Yes Moist: Yes Wound Preparation Ulcer Cleansing: Other: soap and water, Topical Anesthetic Applied: None Treatment Notes Wound #1 (Left, Circumferential Lower Leg) 1. Cleansed with: Cleanse wound with antibacterial soap and water 4. Dressing Applied: Aquacel Ag 5. Secondary Dressing Applied ABD Pad Dry Gauze 7. Secured with 3 Layer Compression System -  Bilateral Electronic Signature(s) Signed: 09/26/2015 4:16:25 PM By: Lucrezia Starch, RN, Sendra Entered By: Lucrezia Starch RN, Rosalio Macadamia  [name] 09/26/2015 15:13:28 JACKLYN, BRANAN ([id number]) -------------------------------------------------------------------------------- Wound Assessment Details Patient Name: NICLOE, FRONTERA 09/26/2015 3:00 Date of Service: PM Medical Record [id number] Number: Patient Account Number: [id number] 23-Sep-1940 (75 y.o. Treating RN: Leonard Downing Date of Birth/Sex: Female) Other Clinician: Primary Care Physician: Elizabeth Sauer Treating Britto, Errol Referring Physician: Levora Dredge Physician/Extender: Tania Ade in Treatment: 0 Wound Status Wound Number: 2 Primary Etiology: Lymphedema Wound Location: Right Lower Leg - Wound Status: Open Circumfernential Comorbid History: Cataracts, Hypertension Wounding Event: Gradually Appeared Date Acquired: 09/07/2014 Weeks Of Treatment: 0 Clustered Wound: No Photos Photo Uploaded By: Lucrezia Starch, RN, Rosalio Macadamia [name] 09/26/2015 16:10:26 Wound Measurements Length: (cm) 13 Width: (cm) 27 Depth: (cm) 0.1 Area: (cm) 275.675 Volume: (cm) 27.567 % Reduction in Area: % Reduction in Volume: Epithelialization: Large (67-100%) Tunneling: No Undermining: No Wound Description Classification: Partial Thickness Foul Odor After Wound Margin: Flat and Intact Exudate Amount: Large Exudate Type: Serous Exudate Color: amber Cleansing: No Wound Bed Granulation Amount: Large (67-100%) Exposed Structure Granulation Quality: Pink Fascia Exposed: No Sparano, Blessing L. ([id number]) Necrotic Amount: None Present (0%) Fat Layer Exposed: No Tendon Exposed: No Muscle Exposed: No Joint Exposed: No Bone Exposed: No Limited to Skin Breakdown Periwound Skin Texture Texture Color No Abnormalities Noted: No No Abnormalities Noted: No Callus: No Atrophie Blanche: No Crepitus: No Cyanosis: No Excoriation: No Ecchymosis: No Fluctuance:  No Erythema: No Friable: No Hemosiderin Staining: No Induration: No Mottled: No Localized Edema: No Pallor: No Rash: No Rubor: No Scarring: No Moisture No Abnormalities Noted: No Dry / Scaly: Yes Maceration: No Moist: Yes Wound Preparation Ulcer Cleansing: Other: soap and water, Topical Anesthetic Applied: None Treatment Notes Wound #2 (Right, Circumferential Lower Leg) 1. Cleansed with: Cleanse wound with antibacterial soap and water 4. Dressing Applied: Aquacel Ag 5. Secondary Dressing Applied ABD Pad Dry Gauze 7. Secured with 3 Layer Compression System - Bilateral Electronic Signature(s) Signed: 09/26/2015 4:16:25 PM By: Lucrezia Starch RN, Sendra Entered By: Lucrezia Starch RN, Sendra [name] 09/26/2015 15:15:03

## 2015-09-27 NOTE — Progress Notes (Signed)
SHAKTI, FLEER ([id number]) Visit Report for 09/26/2015 Chief Complaint Document Details Patient Name: [name], [name] 09/26/2015 3:00 Date of Service: PM Medical Record [id number] Number: Patient Account Number: [id number] [birth year] (75 y.o. Treating RN: Leonard Downing Date of Birth/Sex: Female) Other Clinician: Primary Care Physician: Elizabeth Sauer Treating Evlyn Kanner Referring Physician: Levora Dredge Physician/Extender: Tania Ade in Treatment: 0 Information Obtained from: Patient Chief Complaint Patient presents for treatment of an open ulcer due to venous insufficiency which has been continuing this way for several months Electronic Signature(s) Signed: 09/26/2015 4:13:04 PM By: Evlyn Kanner MD, FACS Entered By: Evlyn Kanner on 09/26/2015 16:13:04 [name], [name] ([id number]) -------------------------------------------------------------------------------- HPI Details Patient Name: [name], [name] 09/26/2015 3:00 Date of Service: PM Medical Record [id number] Number: Patient Account Number: [id number] [birth year] (74 y.o. Treating RN: Leonard Downing Date of Birth/Sex: Female) Other Clinician: Primary Care Physician: Elizabeth Sauer Treating Evlyn Kanner Referring Physician: Levora Dredge Physician/Extender: Tania Ade in Treatment: 0 History of Present Illness Location: bilateral lower extremity wounds Quality: Patient reports experiencing heaviness to affected area(s). Severity: Patient states wound are getting worse. Duration: Patient has had the wound for > 8 months prior to seeking treatment at the wound center Timing: Pain in wound is Intermittent (comes and goes Context: The wound would happen gradually Modifying Factors: Other treatment(s) tried include:Unna's boots and treatment by Dr. Gilda Crease at the vascular surgery office Associated Signs and Symptoms: Patient reports having increase discharge. HPI Description: 75 year old patient who is a poor  historian but most of the history is being obtained from Dr. Levora Dredge her vascular surgeon who has been treating her for about 8 months to a year. the patient has a postphlebitic syndrome both lower extremities and has been under his care for a while. The ABIs done in 2013 were normal and he does not suspect any vascular arterial compromise as the resting ABI on the right was 1.03 on the left was 1.02. An ultrasound of the lower extremity bilaterally did not reveal any DVT or superficial thrombophlebitis bilaterally. Patient's initial problem started when she had a hematoma for right lower extremity due to being on Coumadin and then developed a lot of swelling and has been having Unna's boots for a long while. However recently her left lower extremity started swelling and oozing profusely and the patient did not want to have Unna's boots applied and hence Dr. Gilda Crease has referred her to [place] for possible failure of 4 layer wraps with Profore. past medical history does not include diabetes mellitus but she has a cardiac issue and has had some element of CHF. She has been treated with lymphedema pumps in the past. Electronic Signature(s) Signed: 09/26/2015 4:16:40 PM By: Evlyn Kanner MD, FACS Entered By: Evlyn Kanner on 09/26/2015 16:16:40 [name], [name] ([id number]) -------------------------------------------------------------------------------- Physical Exam Details Patient Name: [name], [name] 09/26/2015 3:00 Date of Service: PM Medical Record [id number] Number: Patient Account Number: [id number] 28-Nov-1940 (74 y.o. Treating RN: Leonard Downing Date of Birth/Sex: Female) Other Clinician: Primary Care Physician: Elizabeth Sauer Treating Evlyn Kanner Referring Physician: Levora Dredge Physician/Extender: Tania Ade in Treatment: 0 Constitutional . Pulse regular. Respirations normal and unlabored. Afebrile. . Eyes Nonicteric. Reactive to light. Ears, Nose, Mouth, and  Throat Lips, teeth, and gums WNL.Marland Kitchen Moist mucosa without lesions. Neck supple and nontender. No palpable supraclavicular or cervical adenopathy. Normal sized without goiter. Respiratory WNL. No retractions.. Breath sounds WNL, No rubs, rales, rhonchi, or wheeze.. Cardiovascular Pedal Pulses WNL. ABI from the vascular office noted to be normal bilaterally. she has  significant lymphedema stage II bilaterally left worse than right. Gastrointestinal (GI) Abdomen without masses or tenderness.. No liver or spleen enlargement or tenderness.. Lymphatic No adneopathy. No adenopathy. No adenopathy. Musculoskeletal Adexa without tenderness or enlargement.. Digits and nails w/o clubbing, cyanosis, infection, petechiae, ischemia, or inflammatory conditions.. Integumentary (Hair, Skin) No suspicious lesions. No crepitus or fluctuance. No peri-wound warmth or erythema. No masses.Marland Kitchen Psychiatric Judgement and insight Intact.. No evidence of depression, anxiety, or agitation.. Notes the patient has significant amount of lymphedema and active weeping left worse than the right. Multiple ulcerations and microperforations present. Electronic Signature(s) Signed: 09/26/2015 4:18:16 PM By: Evlyn Kanner MD, FACS Entered By: Evlyn Kanner on 09/26/2015 16:18:15 [name], [name] ([id number]) -------------------------------------------------------------------------------- Physician Orders Details Patient Name: [name], [name] 09/26/2015 3:00 Date of Service: PM Medical Record [id number] Number: Patient Account Number: [id number] 1941-02-05 (74 y.o. Treating RN: Leonard Downing Date of Birth/Sex: Female) Other Clinician: Primary Care Physician: Elizabeth Sauer Treating Evlyn Kanner Referring Physician: Levora Dredge Physician/Extender: Tania Ade in Treatment: 0 Verbal / Phone Orders: Yes Clinician: Leonard Downing Read Back and Verified: Yes Diagnosis Coding Wound Cleansing Wound #1  Left,Circumferential Lower Leg o Cleanse wound with mild soap and water o May shower with protection. Wound #2 Right,Circumferential Lower Leg o Cleanse wound with mild soap and water o May shower with protection. Primary Wound Dressing Wound #1 Left,Circumferential Lower Leg o Aquacel Ag Wound #2 Right,Circumferential Lower Leg o Aquacel Ag Secondary Dressing Wound #1 Left,Circumferential Lower Leg o ABD pad o Dry Gauze Wound #2 Right,Circumferential Lower Leg o ABD pad o Dry Gauze Dressing Change Frequency Wound #1 Left,Circumferential Lower Leg o Three times weekly Wound #2 Right,Circumferential Lower Leg o Three times weekly Follow-up Appointments Wound #1 Left,Circumferential Lower Leg Hunton, Kadasia L. ([id number]) o Return Appointment in 1 week. Wound #2 Right,Circumferential Lower Leg o Return Appointment in 1 week. Edema Control Wound #1 Left,Circumferential Lower Leg o 3 Layer Compression System - Bilateral Wound #2 Right,Circumferential Lower Leg o 3 Layer Compression System - Bilateral Home Health Wound #1 Left,Circumferential Lower Leg o Continue Home Health Visits o Home Health Nurse may visit PRN to address patientos wound care needs. o FACE TO FACE ENCOUNTER: MEDICARE and MEDICAID PATIENTS: I certify that this patient is under my care and that I had a face-to-face encounter that meets the physician face-to-face encounter requirements with this patient on this date. The encounter with the patient was in whole or in part for the following MEDICAL CONDITION: (primary reason for Home Healthcare) MEDICAL NECESSITY: I certify, that based on my findings, NURSING services are a medically necessary home health service. HOME BOUND STATUS: I certify that my clinical findings support that this patient is homebound (i.e., Due to illness or injury, pt requires aid of supportive devices such as crutches, cane, wheelchairs, walkers,  the use of special transportation or the assistance of another person to leave their place of residence. There is a normal inability to leave the home and doing so requires considerable and taxing effort. Other absences are for medical reasons / religious services and are infrequent or of short duration when for other reasons). Wound #2 Right,Circumferential Lower Leg o Continue Home Health Visits o Home Health Nurse may visit PRN to address patientos wound care needs. o FACE TO FACE ENCOUNTER: MEDICARE and MEDICAID PATIENTS: I certify that this patient is under my care and that I had a face-to-face encounter that meets the physician face-to-face encounter requirements with this patient on this date. The encounter with the  patient was in whole or in part for the following MEDICAL CONDITION: (primary reason for Home Healthcare) MEDICAL NECESSITY: I certify, that based on my findings, NURSING services are a medically necessary home health service. HOME BOUND STATUS: I certify that my clinical findings support that this patient is homebound (i.e., Due to illness or injury, pt requires aid of supportive devices such as crutches, cane, wheelchairs, walkers, the use of special transportation or the assistance of another person to leave their place of residence. There is a normal inability to leave the home and doing so requires considerable and taxing effort. Other absences are for medical reasons / religious services and are infrequent or of short duration when for other reasons). Electronic Signature(s) Signed: 09/26/2015 4:16:25 PM By: Maureen [place]boro, RN, [place] Signed: 09/26/2015 4:23:25 PM By: Evlyn KannerBritto, Jefry Lesinski MD, FACS [name], [name] ([id number]) Entered By: Lucrezia Starchose[place], RN, Send[place] on 09/26/2015 16:02:03 [name], [name] ([id number]) -------------------------------------------------------------------------------- Problem List Details Patient Name: [name], [name] 09/26/2015 3:00 Date of  Service: PM Medical Record [id number] Number: Patient Account Number: [id number] Jun 19, 1941 (74 y.o. 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 ([id number]) -------------------------------------------------------------------------------- Progress Note Details Patient Name: [name], Montia L. 09/26/2015 3:00 Date of Service: PM Medical Record [id number] Number: Patient Account Number: [id number] Jun 19, 1941 (74 y.o. Treating RN: Leonard Downingoseboro, Sendra Date of Birth/Sex: Female) Other Clinician: Primary Care Physician: Elizabeth SauerJones, Deanna Treating Evlyn KannerBritto, Brandyn Lowrey Referring Physician: Levora DredgeSCHNIER, GREGORY Physician/Extender: Tania AdeWeeks in Treatment: 0 Subjective Chief Complaint Information obtained from Patient Patient presents for treatment of an open ulcer due to venous insufficiency which has been continuing this way for several months History of Present Illness (HPI) The following HPI elements were documented for the patient's wound: Location: bilateral lower extremity wounds Quality: Patient reports experiencing heaviness to affected area(s). Severity: Patient states wound  are getting worse. Duration: Patient has had the wound for > 8 months prior to seeking treatment at the wound center Timing: Pain in wound is Intermittent (comes and goes Context: The wound would happen gradually Modifying Factors: Other treatment(s) tried include:Unna's boots and treatment by Dr. Gilda CreaseSchnier at the vascular surgery office Associated Signs and Symptoms: Patient reports having increase discharge. 75 year old patient who is a poor historian but most of the history is being obtained from Dr. Levora DredgeGregory Schnier her vascular surgeon who has been treating her for about 8 months to a year. the patient has a postphlebitic syndrome both lower extremities and has been under his care for a while. The ABIs done in 2013 were normal and he does not suspect any vascular arterial compromise as the resting ABI on the right was 1.03 on the left was 1.02. An ultrasound of the lower extremity bilaterally did not reveal any DVT or superficial thrombophlebitis bilaterally. Patient's initial problem started when she had a hematoma for right lower extremity due to being on Coumadin and then developed a lot of  swelling and has been having Unna's boots for a long while. However recently her left lower extremity started swelling and oozing profusely and the patient did not want to have Unna's boots applied and hence Dr. Gilda Crease has referred her to [place] for possible failure of 4 layer wraps with Profore. past medical history does not include diabetes mellitus but she has a cardiac issue and has had some element of CHF. She has been treated with lymphedema pumps in the past. Wound History Patient presents with 2 open wounds that have been present for approximately one year. Patient has been treating wounds in the following manner: unna boot, going to vascular doctor. Laboratory tests have not been performed in the last month. Patient reportedly has not tested positive for an antibiotic resistant Wanat, Addalie L.  ([id number]) organism. Patient reportedly has not tested positive for osteomyelitis. Patient reportedly has had testing performed to evaluate circulation in the legs. Patient experiences the following problems associated with their wounds: swelling, lots of drainage. Patient History Information obtained from Patient. Allergies NKDA Family History Cancer, Heart Disease, Hypertension, Kidney Disease, Stroke, No family history of Diabetes, Lung Disease, Seizures, Thyroid Problems, Tuberculosis. Social History Former smoker - quit when she was 29, Marital Status - Widowed, Alcohol Use - Never, Drug Use - No History, Caffeine Use - Never. Medical History Eyes Patient has history of Cataracts - removed Cardiovascular Patient has history of Deep Vein Thrombosis - hx of, Hypertension, Peripheral Venous Disease - varicose veins Medical And Surgical History Notes Constitutional Symptoms (General Health) Lymphedema, obesity, poor circulation Review of Systems (ROS) Constitutional Symptoms (General Health) The patient has no complaints or symptoms. Eyes The patient has no complaints or symptoms. Ear/Nose/Mouth/Throat The patient has no complaints or symptoms. Hematologic/Lymphatic The patient has no complaints or symptoms. Respiratory The patient has no complaints or symptoms. Cardiovascular Complains or has symptoms of LE edema. Gastrointestinal The patient has no complaints or symptoms. Endocrine The patient has no complaints or symptoms. Genitourinary The patient has no complaints or symptoms. Immunological The patient has no complaints or symptoms. Kowalski, Nikisha L. ([id number]) Integumentary (Skin) Complains or has symptoms of Wounds. Musculoskeletal The patient has no complaints or symptoms. Neurologic The patient has no complaints or symptoms. Oncologic The patient has no complaints or symptoms. Psychiatric The patient has no complaints or  symptoms. Medications: patient's medication list is not available at this time. Objective Constitutional Pulse regular. Respirations normal and unlabored. Afebrile. Eyes Nonicteric. Reactive to light. Ears, Nose, Mouth, and Throat Lips, teeth, and gums WNL.Marland Kitchen Moist mucosa without lesions. Neck supple and nontender. No palpable supraclavicular or cervical adenopathy. Normal sized without goiter. Respiratory WNL. No retractions.. Breath sounds WNL, No rubs, rales, rhonchi, or wheeze.. Cardiovascular Pedal Pulses WNL. ABI from the vascular office noted to be normal bilaterally. she has significant lymphedema stage II bilaterally left worse than right. Gastrointestinal (GI) Abdomen without masses or tenderness.. No liver or spleen enlargement or tenderness.. Lymphatic No adneopathy. No adenopathy. No adenopathy. Musculoskeletal Adexa without tenderness or enlargement.. Digits and nails w/o clubbing, cyanosis, infection, petechiae, Bhandari, Marshae L. ([id number]) ischemia, or inflammatory conditions.Marland Kitchen Psychiatric Judgement and insight Intact.. No evidence of depression, anxiety, or agitation.. General Notes: the patient has significant amount of lymphedema and active weeping left worse than the right. Multiple ulcerations and microperforations present. Integumentary (Hair, Skin) No suspicious lesions. No crepitus or fluctuance. No peri-wound warmth or erythema. No masses.. Wound #1 status is Open. Original cause of wound was Gradually Appeared. The wound is located  on the Left,Circumferential Lower Leg. The wound measures 24cm length x 46cm width x 0.1cm depth; 867.08cm^2 area and 86.708cm^3 volume. The wound is limited to skin breakdown. There is no tunneling or undermining noted. There is a large amount of serous drainage noted. The wound margin is flat and intact. There is large (67-100%) pink granulation within the wound bed. There is no necrotic tissue within the wound bed. The  periwound skin appearance exhibited: Maceration, Moist. The periwound skin appearance did not exhibit: Callus, Crepitus, Excoriation, Fluctuance, Friable, Induration, Localized Edema, Rash, Scarring, Dry/Scaly, Atrophie Blanche, Cyanosis, Ecchymosis, Hemosiderin Staining, Mottled, Pallor, Rubor, Erythema. Wound #2 status is Open. Original cause of wound was Gradually Appeared. The wound is located on the Right,Circumferential Lower Leg. The wound measures 13cm length x 27cm width x 0.1cm depth; 275.675cm^2 area and 27.567cm^3 volume. The wound is limited to skin breakdown. There is no tunneling or undermining noted. There is a large amount of serous drainage noted. The wound margin is flat and intact. There is large (67-100%) pink granulation within the wound bed. There is no necrotic tissue within the wound bed. The periwound skin appearance exhibited: Dry/Scaly, Moist. The periwound skin appearance did not exhibit: Callus, Crepitus, Excoriation, Fluctuance, Friable, Induration, Localized Edema, Rash, Scarring, Maceration, Atrophie Blanche, Cyanosis, Ecchymosis, Hemosiderin Staining, Mottled, Pallor, Rubor, Erythema. 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 ([id number]) this 75 year old patient who has bilateral lymphedema and has been treated and worked up by the vascular surgeon was sent to [place] today as they were unable to wrap her with one is bruits possibly due to a a reaction to the material used. After reviewing her I have recommended silver alginate over the ulcerated areas and a 3 layer Profore wrap. I have confirmed with Dr. Gilda Crease that  she has had no arterial insufficiency in the past and he is quite comfortable recommending even a 4-layer compression wrap. we have discussed elevation and exercise and asked her to continue the doxycycline which she has been started on by Dr. Gilda Crease. Plan Wound Cleansing: Wound #1 Left,Circumferential Lower Leg: Cleanse wound with mild soap and water May shower with protection. Wound #2 Right,Circumferential Lower Leg: Cleanse wound with mild soap and water May shower with protection. Primary Wound Dressing: Wound #1 Left,Circumferential Lower Leg: Aquacel Ag Wound #2 Right,Circumferential Lower Leg: Aquacel Ag Secondary Dressing: Wound #1 Left,Circumferential Lower Leg: ABD pad Dry Gauze Wound #2 Right,Circumferential Lower Leg: ABD pad Dry Gauze Dressing Change Frequency: Wound #1 Left,Circumferential Lower Leg: Three times weekly Wound #2 Right,Circumferential Lower Leg: Three times weekly Follow-up Appointments: Wound #1 Left,Circumferential Lower Leg: Return Appointment in 1 week. Wound #2 Right,Circumferential Lower Leg: Return Appointment in 1 week. Edema Control: Wound #1 Left,Circumferential Lower Leg: 3 Layer Compression System - Bilateral Wound #2 Right,Circumferential Lower Leg: 3 Layer Compression System - Bilateral Home Health: Wound #1 Left,Circumferential Lower Leg: SAGE, HAMMILL ([id number]) Continue Home Health Visits Home Health Nurse may visit PRN to address patient s wound care needs. FACE TO FACE ENCOUNTER: MEDICARE and MEDICAID PATIENTS: I certify that this patient is under my care and that I had a face-to-face encounter that meets the physician face-to-face encounter requirements with this patient on this date.  The encounter with the patient was in whole or in part for the following MEDICAL CONDITION: (primary reason for Home Healthcare) MEDICAL NECESSITY: I certify, that based on my findings, NURSING services are a medically necessary home  health service. HOME BOUND STATUS: I certify that my clinical findings support that this patient is homebound (i.e., Due to illness or injury, pt requires aid of supportive devices such as crutches, cane, wheelchairs, walkers, the use of special transportation or the assistance of another person to leave their place of residence. There is a normal inability to leave the home and doing so requires considerable and taxing effort. Other absences are for medical reasons / religious services and are infrequent or of short duration when for other reasons). Wound #2 Right,Circumferential Lower Leg: Continue Home Health Visits Home Health Nurse may visit PRN to address patient s wound care needs. FACE TO FACE ENCOUNTER: MEDICARE and MEDICAID PATIENTS: I certify that this patient is under my care and that I had a face-to-face encounter that meets the physician face-to-face encounter requirements with this patient on this date. The encounter with the patient was in whole or in part for the following MEDICAL CONDITION: (primary reason for Home Healthcare) MEDICAL NECESSITY: I certify, that based on my findings, NURSING services are a medically necessary home health service. HOME BOUND STATUS: I certify that my clinical findings support that this patient is homebound (i.e., Due to illness or injury, pt requires aid of supportive devices such as crutches, cane, wheelchairs, walkers, the use of special transportation or the assistance of another person to leave their place of residence. There is a normal inability to leave the home and doing so requires considerable and taxing effort. Other absences are for medical reasons / religious services and are infrequent or of short duration when for other reasons). this 75 year old patient who has bilateral lymphedema and has been treated and worked up by the vascular surgeon was sent to [place] today as they were unable to wrap her with one is bruits possibly due to a a  reaction to the material used. After reviewing her I have recommended silver alginate over the ulcerated areas and a 3 layer Profore wrap. I have confirmed with Dr. Gilda Crease that she has had no arterial insufficiency in the past and he is quite comfortable recommending even a 4-layer compression wrap. We have discussed elevation and exercise and asked her to continue the doxycycline which she has been started on by Dr. Gilda Crease. Electronic Signature(s) Signed: 09/26/2015 4:20:54 PM By: Evlyn Kanner MD, FACS Entered By: Evlyn Kanner on 09/26/2015 16:20:54 Dalia, [name] ([id number]) -------------------------------------------------------------------------------- ROS/PFSH Details Patient Name: KEWANNA, KASPRZAK 09/26/2015 3:00 Date of Service: PM Medical Record [id number] Number: Patient Account Number: [id number] 07-26-1940 (74 y.o. Treating RN: Leonard Downing Date of Birth/Sex: Female) Other Clinician: Primary Care Physician: Elizabeth Sauer Treating Evlyn Kanner Referring Physician: Levora Dredge Physician/Extender: Tania Ade in Treatment: 0 Information Obtained From Patient Wound History Do you currently have one or more open woundso Yes How many open wounds do you currently haveo 2 Approximately how long have you had your woundso one year How have you been treating your wound(s) until nowo unna boot, going to vascular doctor Has your wound(s) ever healed and then re-openedo No Have you had any lab work done in the past montho No Have you tested positive for an antibiotic resistant organism No (MRSA, VRE)o Have you tested positive for osteomyelitis (bone infection)o No Have you had any tests for circulation on your legso  Yes Who ordered the testo [facility] Where was the test doneo [facility] Have you had other problems associated with your woundso Swelling, Other: lots of drainage Cardiovascular Complaints and Symptoms: Positive for: LE edema Medical History: Positive for: Deep  Vein Thrombosis - hx of; Hypertension; Peripheral Venous Disease - varicose veins Integumentary (Skin) Complaints and Symptoms: Positive for: Wounds Constitutional Symptoms (General Health) Complaints and Symptoms: No Complaints or Symptoms Medical History: Past Medical History Notes: Lymphedema, obesity, poor circulation Vosler, Charise L. ([id number]) Eyes Complaints and Symptoms: No Complaints or Symptoms Medical History: Positive for: Cataracts - removed Ear/Nose/Mouth/Throat Complaints and Symptoms: No Complaints or Symptoms Hematologic/Lymphatic Complaints and Symptoms: No Complaints or Symptoms Respiratory Complaints and Symptoms: No Complaints or Symptoms Gastrointestinal Complaints and Symptoms: No Complaints or Symptoms Endocrine Complaints and Symptoms: No Complaints or Symptoms Genitourinary Complaints and Symptoms: No Complaints or Symptoms Immunological Complaints and Symptoms: No Complaints or Symptoms Musculoskeletal Complaints and Symptoms: No Complaints or Symptoms Neurologic Complaints and Symptoms: No Complaints or Symptoms Stanger, Rosita L. ([id number]) Oncologic Complaints and Symptoms: No Complaints or Symptoms Psychiatric Complaints and Symptoms: No Complaints or Symptoms HBO Extended History Items Eyes: Cataracts Family and Social History Cancer: Yes; Diabetes: No; Heart Disease: Yes; Hypertension: Yes; Kidney Disease: Yes; Lung Disease: No; Seizures: No; Stroke: Yes; Thyroid Problems: No; Tuberculosis: No; Former smoker - quit when she was 39; Marital Status - Widowed; Alcohol Use: Never; Drug Use: No History; Caffeine Use: Never; Financial Concerns: No; Food, Clothing or Shelter Needs: No; Support System Lacking: No; Transportation Concerns: No; Living Will: Yes (Not Provided) Physician Affirmation I have reviewed and agree with the above information. Electronic Signature(s) Signed: 09/26/2015 4:02:00 PM By: Evlyn Kanner MD,  FACS Signed: 09/26/2015 4:16:25 PM By: Lucrezia Starch RN, Sendra Entered By: Evlyn Kanner on 09/26/2015 16:02:00 Dresden, [name] ([id number]) -------------------------------------------------------------------------------- SuperBill Details Patient Name: Stemm, Lamekia L. Date of Service: 09/26/2015 Medical Record Number: [id number] Patient Account Number: [id number] Date of Birth/Sex: October 30, 1940 (75 y.o. 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

## 2015-09-28 DIAGNOSIS — Z9181 History of falling: Secondary | ICD-10-CM | POA: Diagnosis not present

## 2015-09-28 DIAGNOSIS — I272 Other secondary pulmonary hypertension: Secondary | ICD-10-CM | POA: Diagnosis not present

## 2015-09-28 DIAGNOSIS — I872 Venous insufficiency (chronic) (peripheral): Secondary | ICD-10-CM | POA: Diagnosis not present

## 2015-09-28 DIAGNOSIS — I5022 Chronic systolic (congestive) heart failure: Secondary | ICD-10-CM | POA: Diagnosis not present

## 2015-09-28 DIAGNOSIS — I4891 Unspecified atrial fibrillation: Secondary | ICD-10-CM | POA: Diagnosis not present

## 2015-09-28 DIAGNOSIS — Z87891 Personal history of nicotine dependence: Secondary | ICD-10-CM | POA: Diagnosis not present

## 2015-09-28 DIAGNOSIS — D509 Iron deficiency anemia, unspecified: Secondary | ICD-10-CM | POA: Diagnosis not present

## 2015-09-28 DIAGNOSIS — L300 Nummular dermatitis: Secondary | ICD-10-CM | POA: Diagnosis not present

## 2015-09-28 DIAGNOSIS — I110 Hypertensive heart disease with heart failure: Secondary | ICD-10-CM | POA: Diagnosis not present

## 2015-09-28 DIAGNOSIS — I340 Nonrheumatic mitral (valve) insufficiency: Secondary | ICD-10-CM | POA: Diagnosis not present

## 2015-09-28 DIAGNOSIS — S8011XD Contusion of right lower leg, subsequent encounter: Secondary | ICD-10-CM | POA: Diagnosis not present

## 2015-09-29 DIAGNOSIS — S8011XD Contusion of right lower leg, subsequent encounter: Secondary | ICD-10-CM | POA: Diagnosis not present

## 2015-09-29 DIAGNOSIS — I872 Venous insufficiency (chronic) (peripheral): Secondary | ICD-10-CM | POA: Diagnosis not present

## 2015-09-29 DIAGNOSIS — Z87891 Personal history of nicotine dependence: Secondary | ICD-10-CM | POA: Diagnosis not present

## 2015-09-29 DIAGNOSIS — Z9181 History of falling: Secondary | ICD-10-CM | POA: Diagnosis not present

## 2015-09-29 DIAGNOSIS — I110 Hypertensive heart disease with heart failure: Secondary | ICD-10-CM | POA: Diagnosis not present

## 2015-09-29 DIAGNOSIS — D509 Iron deficiency anemia, unspecified: Secondary | ICD-10-CM | POA: Diagnosis not present

## 2015-09-29 DIAGNOSIS — I5022 Chronic systolic (congestive) heart failure: Secondary | ICD-10-CM | POA: Diagnosis not present

## 2015-09-29 DIAGNOSIS — I4891 Unspecified atrial fibrillation: Secondary | ICD-10-CM | POA: Diagnosis not present

## 2015-09-29 DIAGNOSIS — I340 Nonrheumatic mitral (valve) insufficiency: Secondary | ICD-10-CM | POA: Diagnosis not present

## 2015-09-29 DIAGNOSIS — I272 Other secondary pulmonary hypertension: Secondary | ICD-10-CM | POA: Diagnosis not present

## 2015-10-03 ENCOUNTER — Encounter: Payer: Self-pay | Admitting: Emergency Medicine

## 2015-10-03 ENCOUNTER — Other Ambulatory Visit: Payer: Self-pay

## 2015-10-03 ENCOUNTER — Encounter: Payer: Medicare Other | Admitting: Surgery

## 2015-10-03 ENCOUNTER — Emergency Department: Payer: Medicare Other

## 2015-10-03 ENCOUNTER — Inpatient Hospital Stay
Admission: EM | Admit: 2015-10-03 | Discharge: 2015-10-07 | DRG: 603 | Disposition: A | Discharge status: Home Health | Payer: Medicare Other | Attending: Internal Medicine | Admitting: Internal Medicine

## 2015-10-03 DIAGNOSIS — I5022 Chronic systolic (congestive) heart failure: Secondary | ICD-10-CM | POA: Diagnosis present

## 2015-10-03 DIAGNOSIS — E663 Overweight: Secondary | ICD-10-CM | POA: Diagnosis not present

## 2015-10-03 DIAGNOSIS — I10 Essential (primary) hypertension: Secondary | ICD-10-CM | POA: Diagnosis present

## 2015-10-03 DIAGNOSIS — Z823 Family history of stroke: Secondary | ICD-10-CM

## 2015-10-03 DIAGNOSIS — I482 Chronic atrial fibrillation, unspecified: Secondary | ICD-10-CM | POA: Diagnosis present

## 2015-10-03 DIAGNOSIS — L97212 Non-pressure chronic ulcer of right calf with fat layer exposed: Secondary | ICD-10-CM | POA: Diagnosis not present

## 2015-10-03 DIAGNOSIS — E785 Hyperlipidemia, unspecified: Secondary | ICD-10-CM | POA: Diagnosis present

## 2015-10-03 DIAGNOSIS — L03116 Cellulitis of left lower limb: Secondary | ICD-10-CM | POA: Diagnosis present

## 2015-10-03 DIAGNOSIS — Z96642 Presence of left artificial hip joint: Secondary | ICD-10-CM | POA: Diagnosis not present

## 2015-10-03 DIAGNOSIS — Z87891 Personal history of nicotine dependence: Secondary | ICD-10-CM | POA: Diagnosis not present

## 2015-10-03 DIAGNOSIS — I2510 Atherosclerotic heart disease of native coronary artery without angina pectoris: Secondary | ICD-10-CM | POA: Diagnosis present

## 2015-10-03 DIAGNOSIS — L03115 Cellulitis of right lower limb: Principal | ICD-10-CM | POA: Diagnosis present

## 2015-10-03 DIAGNOSIS — L0390 Cellulitis, unspecified: Secondary | ICD-10-CM | POA: Diagnosis present

## 2015-10-03 DIAGNOSIS — L97209 Non-pressure chronic ulcer of unspecified calf with unspecified severity: Secondary | ICD-10-CM | POA: Diagnosis not present

## 2015-10-03 DIAGNOSIS — I272 Other secondary pulmonary hypertension: Secondary | ICD-10-CM | POA: Diagnosis present

## 2015-10-03 DIAGNOSIS — I87031 Postthrombotic syndrome with ulcer and inflammation of right lower extremity: Secondary | ICD-10-CM | POA: Diagnosis present

## 2015-10-03 DIAGNOSIS — Z79899 Other long term (current) drug therapy: Secondary | ICD-10-CM | POA: Diagnosis not present

## 2015-10-03 DIAGNOSIS — R0602 Shortness of breath: Secondary | ICD-10-CM | POA: Diagnosis not present

## 2015-10-03 DIAGNOSIS — Z86718 Personal history of other venous thrombosis and embolism: Secondary | ICD-10-CM | POA: Diagnosis not present

## 2015-10-03 DIAGNOSIS — I110 Hypertensive heart disease with heart failure: Secondary | ICD-10-CM | POA: Diagnosis not present

## 2015-10-03 DIAGNOSIS — I739 Peripheral vascular disease, unspecified: Secondary | ICD-10-CM | POA: Diagnosis not present

## 2015-10-03 DIAGNOSIS — R9431 Abnormal electrocardiogram [ECG] [EKG]: Secondary | ICD-10-CM | POA: Diagnosis not present

## 2015-10-03 DIAGNOSIS — I872 Venous insufficiency (chronic) (peripheral): Secondary | ICD-10-CM | POA: Diagnosis present

## 2015-10-03 DIAGNOSIS — Z8249 Family history of ischemic heart disease and other diseases of the circulatory system: Secondary | ICD-10-CM

## 2015-10-03 DIAGNOSIS — I87032 Postthrombotic syndrome with ulcer and inflammation of left lower extremity: Secondary | ICD-10-CM | POA: Diagnosis not present

## 2015-10-03 DIAGNOSIS — M7989 Other specified soft tissue disorders: Secondary | ICD-10-CM | POA: Diagnosis not present

## 2015-10-03 DIAGNOSIS — R069 Unspecified abnormalities of breathing: Secondary | ICD-10-CM | POA: Diagnosis not present

## 2015-10-03 DIAGNOSIS — I890 Lymphedema, not elsewhere classified: Secondary | ICD-10-CM | POA: Diagnosis not present

## 2015-10-03 DIAGNOSIS — L97222 Non-pressure chronic ulcer of left calf with fat layer exposed: Secondary | ICD-10-CM | POA: Diagnosis not present

## 2015-10-03 LAB — CBC WITH DIFFERENTIAL/PLATELET
BASOS ABS: 0 10*3/uL (ref 0–0.1)
BASOS PCT: 0 %
EOS PCT: 3 %
Eosinophils Absolute: 0.3 10*3/uL (ref 0–0.7)
HEMATOCRIT: 30.9 % — AB (ref 35.0–47.0)
Hemoglobin: 9.9 g/dL — ABNORMAL LOW (ref 12.0–16.0)
Lymphocytes Relative: 17 %
Lymphs Abs: 1.7 10*3/uL (ref 1.0–3.6)
MCH: 24 pg — ABNORMAL LOW (ref 26.0–34.0)
MCHC: 32.1 g/dL (ref 32.0–36.0)
MCV: 74.9 fL — ABNORMAL LOW (ref 80.0–100.0)
MONO ABS: 1 10*3/uL — AB (ref 0.2–0.9)
Monocytes Relative: 10 %
NEUTROS ABS: 7.2 10*3/uL — AB (ref 1.4–6.5)
Neutrophils Relative %: 70 %
PLATELETS: 299 10*3/uL (ref 150–440)
RBC: 4.13 MIL/uL (ref 3.80–5.20)
RDW: 18.6 % — AB (ref 11.5–14.5)
WBC: 10.2 10*3/uL (ref 3.6–11.0)

## 2015-10-03 LAB — COMPREHENSIVE METABOLIC PANEL
ALBUMIN: 3.1 g/dL — AB (ref 3.5–5.0)
ALT: 13 U/L — ABNORMAL LOW (ref 14–54)
ANION GAP: 5 (ref 5–15)
AST: 22 U/L (ref 15–41)
Alkaline Phosphatase: 82 U/L (ref 38–126)
BILIRUBIN TOTAL: 0.5 mg/dL (ref 0.3–1.2)
BUN: 19 mg/dL (ref 6–20)
CHLORIDE: 99 mmol/L — AB (ref 101–111)
CO2: 31 mmol/L (ref 22–32)
Calcium: 8.5 mg/dL — ABNORMAL LOW (ref 8.9–10.3)
Creatinine, Ser: 0.87 mg/dL (ref 0.44–1.00)
GFR calc Af Amer: >60 mL/min (ref >60)
GFR calc non Af Amer: >60 mL/min (ref >60)
GLUCOSE: 97 mg/dL (ref 65–99)
POTASSIUM: 3.7 mmol/L (ref 3.5–5.1)
SODIUM: 135 mmol/L (ref 135–145)
TOTAL PROTEIN: 7.3 g/dL (ref 6.5–8.1)

## 2015-10-03 LAB — TROPONIN I: Troponin I: <0.03 ng/mL (ref <0.031)

## 2015-10-03 LAB — LACTIC ACID, PLASMA: Lactic Acid, Venous: 1 mmol/L (ref 0.5–2.0)

## 2015-10-03 MED ORDER — PIPERACILLIN-TAZOBACTAM 3.375 G IVPB
3.3750 g | Freq: Three times a day (TID) | INTRAVENOUS | Status: DC
  Administered 2015-10-04 – 2015-10-06 (×8): 3.375 g via INTRAVENOUS
  Filled 2015-10-03 (×10): qty 50

## 2015-10-03 MED ORDER — OXYCODONE HCL 5 MG PO TABS
5.0000 mg | ORAL_TABLET | ORAL | Status: DC | PRN
  Administered 2015-10-07: 5 mg via ORAL
  Filled 2015-10-03: qty 1

## 2015-10-03 MED ORDER — CLOTRIMAZOLE 1 % EX CREA
TOPICAL_CREAM | Freq: Two times a day (BID) | CUTANEOUS | Status: DC
  Administered 2015-10-04 – 2015-10-06 (×6): via TOPICAL
  Filled 2015-10-03 (×2): qty 15

## 2015-10-03 MED ORDER — SODIUM CHLORIDE 0.9 % IV BOLUS (SEPSIS)
1000.0000 mL | INTRAVENOUS | Status: AC
  Administered 2015-10-03: 1000 mL via INTRAVENOUS

## 2015-10-03 MED ORDER — PIPERACILLIN-TAZOBACTAM 3.375 G IVPB 30 MIN
3.3750 g | Freq: Once | INTRAVENOUS | Status: AC
  Administered 2015-10-03: 3.375 g via INTRAVENOUS
  Filled 2015-10-03: qty 50

## 2015-10-03 MED ORDER — GABAPENTIN 300 MG PO CAPS
300.0000 mg | ORAL_CAPSULE | Freq: Two times a day (BID) | ORAL | Status: DC
  Administered 2015-10-04 – 2015-10-07 (×7): 300 mg via ORAL
  Filled 2015-10-03 (×7): qty 1

## 2015-10-03 MED ORDER — SODIUM CHLORIDE 0.9 % IV SOLN
INTRAVENOUS | Status: AC
  Administered 2015-10-04: via INTRAVENOUS

## 2015-10-03 MED ORDER — DOCUSATE SODIUM 100 MG PO CAPS
200.0000 mg | ORAL_CAPSULE | Freq: Two times a day (BID) | ORAL | Status: DC
  Administered 2015-10-04 – 2015-10-07 (×6): 200 mg via ORAL
  Filled 2015-10-03 (×6): qty 2

## 2015-10-03 MED ORDER — VANCOMYCIN HCL IN DEXTROSE 1-5 GM/200ML-% IV SOLN
1000.0000 mg | Freq: Once | INTRAVENOUS | Status: AC
  Administered 2015-10-03: 1000 mg via INTRAVENOUS
  Filled 2015-10-03: qty 200

## 2015-10-03 MED ORDER — SODIUM CHLORIDE 0.9% FLUSH
3.0000 mL | Freq: Two times a day (BID) | INTRAVENOUS | Status: DC
  Administered 2015-10-04 – 2015-10-06 (×6): 3 mL via INTRAVENOUS

## 2015-10-03 MED ORDER — CARVEDILOL 12.5 MG PO TABS
25.0000 mg | ORAL_TABLET | Freq: Two times a day (BID) | ORAL | Status: DC
  Administered 2015-10-04 – 2015-10-07 (×4): 25 mg via ORAL
  Filled 2015-10-03 (×5): qty 2

## 2015-10-03 MED ORDER — ENOXAPARIN SODIUM 40 MG/0.4ML ~~LOC~~ SOLN
40.0000 mg | SUBCUTANEOUS | Status: DC
  Administered 2015-10-04 – 2015-10-06 (×3): 40 mg via SUBCUTANEOUS
  Filled 2015-10-03 (×3): qty 0.4

## 2015-10-03 MED ORDER — ACETAMINOPHEN 325 MG PO TABS: 650.0000 mg | ORAL_TABLET | Freq: Four times a day (QID) | ORAL | Status: DC | PRN

## 2015-10-03 MED ORDER — VANCOMYCIN HCL 10 G IV SOLR: 1750.0000 mg | INTRAVENOUS | Status: DC

## 2015-10-03 MED ORDER — ONDANSETRON HCL 4 MG PO TABS: 4.0000 mg | ORAL_TABLET | Freq: Four times a day (QID) | ORAL | Status: DC | PRN

## 2015-10-03 MED ORDER — ONDANSETRON HCL 4 MG/2ML IJ SOLN: 4.0000 mg | Freq: Four times a day (QID) | INTRAMUSCULAR | Status: DC | PRN

## 2015-10-03 MED ORDER — VANCOMYCIN HCL IN DEXTROSE 750-5 MG/150ML-% IV SOLN
750.0000 mg | Freq: Two times a day (BID) | INTRAVENOUS | Status: DC
  Administered 2015-10-04 – 2015-10-07 (×7): 750 mg via INTRAVENOUS
  Filled 2015-10-03 (×10): qty 150

## 2015-10-03 MED ORDER — ACETAMINOPHEN 650 MG RE SUPP: 650.0000 mg | Freq: Four times a day (QID) | RECTAL | Status: DC | PRN

## 2015-10-03 MED ORDER — FUROSEMIDE 40 MG PO TABS
40.0000 mg | ORAL_TABLET | ORAL | Status: DC
  Administered 2015-10-04 – 2015-10-07 (×4): 40 mg via ORAL
  Filled 2015-10-03 (×4): qty 1

## 2015-10-03 NOTE — ED Provider Notes (Signed)
[facility] Emergency Department Provider Note  ____________________________________________  Time seen: Approximately 6:56 PM  I have reviewed the triage vital signs and the nursing notes.   HISTORY  Chief Complaint Wound Infection    HPI [name] is a 75 y.o. female with a history of peripheral vascular disease, chronic lower extremity edema, chronic wounds in her extremities, and a history of cellulitis of her legs who presents by private vehicle for evaluation of worsening wounds in her legs.  She reports that they have been getting gradually worse over about the last week.  She has home health and home health as commented that they are getting worse.  She saw Dr. Gilda CreaseSchnier today in clinic who told her she needs to go to the emergency department.  She then went to the wound care clinic and the doctor there said that she needs to come to the emergency department.  She reports worsening redness, swelling, and spread up her leg, particularly on the left.  She has not had any fever or chills, chest pain, shortness of breath, nausea, vomiting, diarrhea.  She has felt some general weakness and general malaise but nothing specific.  Overall her symptoms are severe and nothing is making them better and they are getting gradually worse over time.  She is not currently taking any antibiotics.   Past Medical History  Diagnosis Date  . Hypertension   . Atrial fibrillation (HCC)   . HLD (hyperlipidemia)   . Valvular heart disease   . Pulmonary HTN (HCC)   . Lower extremity edema   . HTN (hypertension) 12/18/2014  . A-fib (HCC) 12/18/2014  . Benign essential HTN 10/25/2014  . Chronic venous insufficiency 12/29/2014  . Chronic systolic heart failure (HCC) 05/18/2014    Overview:  Global ef 35%   . 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 Bilateral lower extremity edema 12/18/2014  . Cellulitis of leg, right 12/18/2014  . History of repair of hip joint 09/19/2014  .  Carpal tunnel syndrome 12/30/2014  . 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 Degenerative arthritis of hip 06/08/2014    Patient Active Problem List   Diagnosis Date Noted  . Pressure ulcer 03/16/2015  . Hematoma of right lower extremity 03/15/2015  . Carpal tunnel syndrome 12/30/2014  . 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 Chronic venous insufficiency 12/29/2014  . Breathlessness on exertion 12/29/2014  . BP (high blood pressure) 12/29/2014  . Cellulitis of leg, left 12/18/2014  . HTN (hypertension) 12/18/2014  . Chronic a-fib (HCC) 12/18/2014  . Bilateral lower extremity edema 12/18/2014  . Atrial fibrillation with RVR (HCC) 12/18/2014  . HLD (hyperlipidemia) 12/18/2014  . Sepsis (HCC) 12/18/2014  . Venous insufficiency of leg 11/03/2014  . Benign essential HTN 10/25/2014  . History of repair of hip joint 09/19/2014  . Acquired lymphedema of leg 09/13/2014  . Bacteremia due to Gram-positive bacteria 09/13/2014  . Combined fat and carbohydrate induced hyperlipemia 09/13/2014  . Degenerative arthritis of hip 06/08/2014  . Chronic systolic heart failure (HCC) 05/18/2014  . 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 Abdominal hysterectomy    . Bilateral wrist surgery    . Left hip replacement    . Cataract surgery Bilateral   . Bedside evacuation of hematoma Right 03/16/2015    Procedure:  EVACUATION OF HEMATOMA;  Surgeon: Renford DillsGregory G Schnier, MD;  Location: [facility] ORS;  Service: Vascular;  Laterality: Right;  . Minor application of wound vac  Right 03/16/2015    Procedure: MINOR APPLICATION OF WOUND VAC;  Surgeon: Renford Dills, MD;  Location: [facility] ORS;  Service: Vascular;  Laterality: Right;  . Joint replacement Left 06/2014    hip  . I&d extremity Right 04/06/2015    Procedure: IRRIGATION AND DEBRIDEMENT EXTREMITY;  Surgeon: Renford Dills, MD;  Location: [facility] ORS;  Service: Vascular;  Laterality: Right;  Wound  matrix skin graft and wound vac placement    Current Outpatient Rx  Name  Route  Sig  Dispense  Refill  . acetaminophen (TYLENOL) 325 MG tablet   Oral   Take 650 mg by mouth every 6 (six) hours as needed.         . carvedilol (COREG) 25 MG tablet   Oral   Take 25 mg by mouth 2 (two) times daily with a meal.          . clotrimazole-betamethasone (LOTRISONE) cream   Topical   Apply 1 application topically 2 (two) times daily.   30 g   0   . feeding supplement (BOOST / RESOURCE BREEZE) LIQD   Oral   Take 1 Container by mouth daily.         . furosemide (LASIX) 40 MG tablet   Oral   Take 40 mg by mouth every morning.         . gabapentin (NEURONTIN) 300 MG capsule   Oral   Take 300 mg by mouth 2 (two) times daily. Reported on 08/22/2015         . docusate sodium (COLACE) 100 MG capsule   Oral   Take 2 capsules (200 mg total) by mouth 2 (two) times daily. Patient not taking: Reported on 10/03/2015   10 capsule   0     Allergies Review of patient's allergies indicates no known allergies.  Family History  Problem Relation Age of Onset  . Heart attack Father   . Stroke Mother     Social History Social History  Substance Use Topics  . Smoking status: Former Smoker    Quit date: 04/01/1984  . Smokeless tobacco: Never Used  . Alcohol Use: No    Review of Systems Constitutional: No fever/chills Eyes: No visual changes. ENT: No sore throat. Cardiovascular: Denies chest pain. Respiratory: Denies shortness of breath. Gastrointestinal: No abdominal pain.  No nausea, no vomiting.  No diarrhea.  No constipation. Genitourinary: Negative for dysuria. Musculoskeletal: Pain, swelling, and worsening redness and bilateral lower extremities, much worse on the left Skin: Negative for rash. Neurological: Negative for headaches, focal weakness or numbness.  10-point ROS otherwise negative.  ____________________________________________   PHYSICAL EXAM:  VITAL  SIGNS: ED Triage Vitals  Enc Vitals Group     BP 10/03/15 1713 120/63 mmHg     Pulse --      Resp 10/03/15 1713 16     Temp 10/03/15 1713 98.3 F (36.8 C)     Temp Source 10/03/15 1713 Oral     SpO2 --      Weight 10/03/15 1713 196 lb (88.905 kg)     Height 10/03/15 1713  (1.702 m)     Head Cir --      Peak Flow --      Pain Score --      Pain Loc --      Pain Edu? --      Excl. in GC? --     Constitutional: Alert and oriented. No acute distress Eyes: Conjunctivae are normal.  PERRL. EOMI. Head: Atraumatic. Nose: No congestion/rhinnorhea. Mouth/Throat: Mucous membranes are moist.  Oropharynx non-erythematous. Neck: No stridor.  No meningeal signs.   Cardiovascular: Normal rate, regular rhythm. Good peripheral circulation. Grossly normal heart sounds.   Respiratory: Normal respiratory effort.  No retractions. Lungs CTAB. Gastrointestinal: Soft and nontender. No distention.  Musculoskeletal: No lower extremity tenderness nor edema. No gross deformities of extremities. Neurologic:  Normal speech and language. No gross focal neurologic deficits are appreciated.  Skin:  Bilateral lower extremity edema which is likely chronic.  She has extensive crusting (dried exudate?)  On bilateral lower extremities but there are no open abscesses or purulent material that is obvious.  She has erythema that appears to be acute, and according to her family as well as the patient herself, it is much worse than it was several days ago.  It extends nearly to the knee on the left lower extremity which the patient states is new.  It is tender to palpation.  It is consistent with cellulitis. Psychiatric: Mood and affect are normal. Speech and behavior are normal.  ____________________________________________   LABS (all labs ordered are listed, but only abnormal results are displayed)  Labs Reviewed  CBC WITH DIFFERENTIAL/PLATELET - Abnormal; Notable for the following:    Hemoglobin 9.9 (*)    HCT  30.9 (*)    MCV 74.9 (*)    MCH 24.0 (*)    RDW 18.6 (*)    Neutro Abs 7.2 (*)    Monocytes Absolute 1.0 (*)    All other components within normal limits  COMPREHENSIVE METABOLIC PANEL - Abnormal; Notable for the following:    Chloride 99 (*)    Calcium 8.5 (*)    Albumin 3.1 (*)    ALT 13 (*)    All other components within normal limits  CULTURE, BLOOD (ROUTINE X 2)  CULTURE, BLOOD (ROUTINE X 2)  LACTIC ACID, PLASMA  TROPONIN I  LACTIC ACID, PLASMA   ____________________________________________  EKG  ED ECG REPORT I, Jesenya Bowditch, the attending physician, personally viewed and interpreted this ECG.   Date: 10/03/2015  EKG Time: 20:27  Rate: 108  Rhythm: atrial fibrillation, rate 108  Axis: Normal  Intervals:A. fib, otherwise unremarkable  ST&T Change: Non-specific ST segment / T-wave changes, but no evidence of acute ischemia.  ____________________________________________  RADIOLOGY   Dg Chest 2 View  10/03/2015  CLINICAL DATA:  Shortness of breath. EXAM: CHEST  2 VIEW COMPARISON:  March 31, 2015. FINDINGS: Stable cardiomegaly. No pneumothorax or pleural effusion is noted. No acute pulmonary disease is noted. Bony thorax is unremarkable. IMPRESSION: No active cardiopulmonary disease. Electronically Signed   By: Lupita Raider, M.D.   On: 10/03/2015 18:43    ____________________________________________   PROCEDURES  Procedure(s) performed: None  Critical Care performed: No ____________________________________________   INITIAL IMPRESSION / ASSESSMENT AND PLAN / ED COURSE  Pertinent labs & imaging results that were available during my care of the patient were reviewed by me and considered in my medical decision making (see chart for details).  Patient is hemodynamically stable and does not meet sepsis criteria at least upon initial evaluation, but she seems to have worsening cellulitis at least on the left lower extremity if not also the right lower  extremity.  No records are available in the computer of her clinic visits today, but I called and spoke by phone with Dr. Wyn Quaker and I explained the reported conversation between Dr. Gilda Crease and the patient and her family earlier today.  He agreed with the plan for admission for treatment of cellulitis and so the Dr. Gilda Crease will see the patient in the hospital likely tomorrow.  ____________________________________________  FINAL CLINICAL IMPRESSION(S) / ED DIAGNOSES  Final diagnoses:  Bilateral cellulitis of lower leg      NEW MEDICATIONS STARTED DURING THIS VISIT:  New Prescriptions   No medications on file      Note:  This document was prepared using Dragon voice recognition software and may include unintentional dictation errors.   Loleta Rose, MD 10/03/15 2148

## 2015-10-03 NOTE — Progress Notes (Signed)
Pharmacy Antibiotic Note  [name] is a 75 y.o. female admitted on 10/03/2015 with cellulitis.  Pharmacy has been consulted for vancomycin and Zosyn dosing.  Plan: 1. Zosyn 3.375 gm IV Q8H EI 2. Vancomycin 1 gm IV x 1 (given in ED) followed in 6 hours (stacked dosing) by 750 mg IV Q12H, predicted trough 15 mcg/mL. Pharmacy will continue to follow and adjust as needed to maintain trough 10 to 15 mcg/mL.   Vd. 50.5, Ke 0.057 hr-1, T1/2 12.1 hr.   Height: 5\' 7"  (170.2 cm) Weight: 196 lb (88.905 kg) IBW/kg (Calculated) : 61.6  Temp (24hrs), Avg:98.3 F (36.8 C), Min:98.3 F (36.8 C), Max:98.3 F (36.8 C)   Recent Labs Lab 10/03/15 2009  WBC 10.2  CREATININE 0.87  LATICACIDVEN 1.0    Estimated Creatinine Clearance: 63.9 mL/min (by C-G formula based on Cr of 0.87).    No Known Allergies  Thank you for allowing pharmacy to be a part of this patient's care.  Carola FrostNathan A Remell Giaimo, Pharm.D., BCPS Clinical Pharmacist 10/03/2015 10:38 PM

## 2015-10-03 NOTE — Progress Notes (Signed)
Vancomycin Dosing: Patient is a 75yo female in the ED being started on Vancomycin and Zosyn for cellulitis. MD has placed orders for Zosyn 3.375g IV once and Vancomycin 1750mg  IV once.  Weight: 88.9 kg, IBW: 61.6kg, ABW: 72.5kg Vd: 50.76L  Will adjust dose to Vancomycin 1g IV once and await further admission orders.  Clovia CuffLisa Gerry Blanchfield, PharmD, BCPS 10/03/2015 7:50 PM

## 2015-10-03 NOTE — ED Notes (Signed)
Pt presents to ED with wounds to lower extremities bilaterally. Pt states Dr. Eliseo SquiresSchneer recommended pt be evaluated in ED. Pt SpO2 84% RA in triage. Pt denies wearing oxygen at home. 3L O2 applied, SpO2 96%

## 2015-10-03 NOTE — Progress Notes (Addendum)
Marie, [name] ([id number]) Visit Report for 10/03/2015 Chief Complaint Document Details Patient Name: [name], [name] 10/03/2015 3:30 Date of Service: PM Medical Record [id number] Number: Patient Account Number: [id number] 01-Nov-1940 (75 y.o. Treating RN: Leonard Downing Date of Birth/Sex: Female) Other Clinician: Primary Care Physician: Elizabeth Sauer Treating Evlyn Kanner Referring Physician: Elizabeth Sauer Physician/Extender: Tania Ade in Treatment: 1 Information Obtained from: Patient Chief Complaint Patient presents for treatment of an open ulcer due to venous insufficiency which has been continuing this way for several months Electronic Signature(s) Signed: 10/03/2015 4:35:25 PM By: Evlyn Kanner MD, FACS Entered By: Evlyn Kanner on 10/03/2015 16:35:25 Berko, Levora Angel ([id number]) -------------------------------------------------------------------------------- HPI Details Patient Name: Marie, [name] 10/03/2015 3:30 Date of Service: PM Medical Record [id number] Number: Patient Account Number: [id number] December 22, 1940 (74 y.o. Treating RN: Leonard Downing Date of Birth/Sex: Female) Other Clinician: Primary Care Physician: Elizabeth Sauer Treating Evlyn Kanner Referring Physician: Elizabeth Sauer Physician/Extender: Tania Ade in Treatment: 1 History of Present Illness Location: bilateral lower extremity wounds Quality: Patient reports experiencing heaviness to affected area(s). Severity: Patient states wound are getting worse. Duration: Patient has had the wound for > 8 months prior to seeking treatment at the wound center Timing: Pain in wound is Intermittent (comes and goes Context: The wound would happen gradually Modifying Factors: Other treatment(s) tried include:Unna's boots and treatment by Dr. Gilda Crease at the vascular surgery office Associated Signs and Symptoms: Patient reports having increase discharge. HPI Description: 75 year old patient who is a poor  historian but most of the history is being obtained from Dr. Levora Dredge her vascular surgeon who has been treating her for about 8 months to a year. the patient has a postphlebitic syndrome both lower extremities and has been under his care for a while. The ABIs done in 2013 were normal and he does not suspect any vascular arterial compromise as the resting ABI on the right was 1.03 on the left was 1.02. An ultrasound of the lower extremity bilaterally did not reveal any DVT or superficial thrombophlebitis bilaterally. Patient's initial problem started when she had a hematoma for right lower extremity due to being on Coumadin and then developed a lot of swelling and has been having Unna's boots for a long while. However recently her left lower extremity started swelling and oozing profusely and the patient did not want to have Unna's boots applied and hence Dr. Gilda Crease has referred her to [place] for possible failure of 4 layer wraps with Profore. past medical history does not include diabetes mellitus but she has a cardiac issue and has had some element of CHF. She has been treated with lymphedema pumps in the past. 10/03/2015 -- the patient's lower extremities have been weeping very significantly and she has had a lot of swelling and redness since the last few days. She was seen by Dr. Gilda Crease this morning and he had recommended admission to the hospital but the patient was unwilling to have that done and came here for a second opinion. Electronic Signature(s) Signed: 10/03/2015 4:36:53 PM By: Evlyn Kanner MD, FACS Entered By: Evlyn Kanner on 10/03/2015 16:36:53 Troiano, Levora Angel ([id number]) -------------------------------------------------------------------------------- Physical Exam Details Patient Name: Marie, [name] 10/03/2015 3:30 Date of Service: PM Medical Record [id number] Number: Patient Account Number: [id number] 1940/10/14 (74 y.o. Treating RN: Leonard Downing Date of  Birth/Sex: Female) Other Clinician: Primary Care Physician: Elizabeth Sauer Treating Evlyn Kanner Referring Physician: Elizabeth Sauer Physician/Extender: Tania Ade in Treatment: 1 Constitutional . Pulse regular. Respirations normal and unlabored. Afebrile. . Eyes Nonicteric. Reactive to  light. Ears, Nose, Mouth, and Throat Lips, teeth, and gums WNL.Marland Kitchen. Moist mucosa without lesions. Neck supple and nontender. No palpable supraclavicular or cervical adenopathy. Normal sized without goiter. Respiratory WNL. No retractions.. Cardiovascular Pedal Pulses WNL. No clubbing, cyanosis or edema. Chest Breasts symmetical and no nipple discharge.. Breast tissue WNL, no masses, lumps, or tenderness.. Lymphatic No adneopathy. No adenopathy. No adenopathy. Musculoskeletal Adexa without tenderness or enlargement.. Digits and nails w/o clubbing, cyanosis, infection, petechiae, ischemia, or inflammatory conditions.. Integumentary (Hair, Skin) No suspicious lesions. No crepitus or fluctuance. No peri-wound warmth or erythema. No masses.Marland Kitchen. Psychiatric Judgement and insight Intact.. No evidence of depression, anxiety, or agitation.. Notes the patient's legs have gross stage II lymphedema with severe cellulitis right up to the knees. She has weeping and micro-ulcerations left worse than right. Electronic Signature(s) Signed: 10/03/2015 4:38:27 PM By: Evlyn KannerBritto, Dessie Tatem MD, FACS Entered By: Evlyn KannerBritto, Loriel Diehl on 10/03/2015 16:38:27 Jarmon, Levora AngelANNIE L. ([id number]) -------------------------------------------------------------------------------- Physician Orders Details Patient Name: [name], Wiley L. 10/03/2015 3:30 Date of Service: PM Medical Record [id number] Number: Patient Account Number: [id number] 05/03/1941 (74 y.o. Treating RN: Leonard Downingoseboro, Sendra Date of Birth/Sex: Female) Other Clinician: Primary Care Physician: Elizabeth SauerJones, Deanna Treating Evlyn KannerBritto, Mikey Maffett Referring Physician: Elizabeth SauerJones,  Deanna Physician/Extender: Tania AdeWeeks in Treatment: 1 Verbal / Phone Orders: Yes Clinician: Leonard Downingoseboro, Sendra Read Back and Verified: Yes Diagnosis Coding Primary Wound Dressing Wound #1 Left,Circumferential Lower Leg o Aquacel Ag Wound #2 Right,Circumferential Lower Leg o Aquacel Ag Secondary Dressing Wound #1 Left,Circumferential Lower Leg o Conform/Kerlix Wound #2 Right,Circumferential Lower Leg o Conform/Kerlix Notes patient to report to ER per AVV for admission. Electronic Signature(s) Signed: 10/03/2015 4:52:55 PM By: Evlyn KannerBritto, Surah Pelley MD, FACS Signed: 10/03/2015 5:13:11 PM By: Lucrezia Starchoseboro, RN, Lennice SitesSendra Entered By: Lucrezia Starchoseboro, RN, Sendra on 10/03/2015 16:40:41 Moroni, Levora AngelANNIE L. ([id number]) -------------------------------------------------------------------------------- Problem List Details Patient Name: [name], Freyja L. 10/03/2015 3:30 Date of Service: PM Medical Record [id number] Number: Patient Account Number: [id number] 05/03/1941 (74 y.o. 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  ([id number]) Entered By: Evlyn KannerBritto, Janiah Devinney on 10/03/2015 16:34:45 Solly, Levora AngelANNIE L. ([id number]) -------------------------------------------------------------------------------- Progress Note Details Patient Name: [name], Lashia L. 10/03/2015 3:30 Date of Service: PM Medical Record [id number] Number: Patient Account Number: [id number] 05/03/1941 (74 y.o. Treating RN: Leonard Downingose[place], Sendra Date of Birth/Sex: Female) Other Clinician: Primary Care Physician: Elizabeth SauerJones, Deanna Treating Evlyn KannerBritto, Meda Dudzinski Referring Physician: Elizabeth SauerJones, Deanna Physician/Extender: Tania AdeWeeks in Treatment: 1 Subjective Chief Complaint Information obtained from Patient Patient presents for treatment of an open ulcer due to venous insufficiency which has been continuing this way for several months History of Present Illness (HPI) The following HPI elements were documented for the patient's wound: Location: bilateral lower extremity wounds Quality: Patient reports experiencing heaviness to affected area(s). Severity: Patient states wound are getting worse. Duration: Patient has had the wound for > 8 months prior to seeking treatment at  the wound center Timing: Pain in wound is Intermittent (comes and goes Context: The wound would happen gradually Modifying Factors: Other treatment(s) tried include:Unna's boots and treatment by Dr. Gilda Crease at the vascular surgery office Associated Signs and Symptoms: Patient reports having increase discharge. 75 year old patient who is a poor historian but most of the history is being obtained from Dr. Levora Dredge her vascular surgeon who has been treating her for about 8 months to a year. the patient has a postphlebitic syndrome both lower extremities and has been under his care for a while. The ABIs done in 2013 were normal and he does not suspect any vascular arterial compromise as the resting ABI on the right was 1.03 on the left was 1.02. An ultrasound of the lower extremity bilaterally did  not reveal any DVT or superficial thrombophlebitis bilaterally. Patient's initial problem started when she had a hematoma for right lower extremity due to being on Coumadin and then developed a lot of swelling and has been having Unna's boots for a long while. However recently her left lower extremity started swelling and oozing profusely and the patient did not want to have Unna's boots applied and hence Dr. Gilda Crease has referred her to [place] for possible failure of 4 layer wraps with Profore. past medical history does not include diabetes mellitus but she has a cardiac issue and has had some element of CHF. She has been treated with lymphedema pumps in the past. 10/03/2015 -- the patient's lower extremities have been weeping very significantly and she has had a lot of swelling and redness since the last few days. She was seen by Dr. Gilda Crease this morning and he had recommended admission to the hospital but the patient was unwilling to have that done and came here for a second opinion. Lauf, Jannelle L. ([id number]) Objective Constitutional Pulse regular. Respirations normal and unlabored. Afebrile. Vitals Time Taken: 4:02 PM, Temperature: 97.7 F, Pulse: 86 bpm, Blood Pressure: 123/71 mmHg. Eyes Nonicteric. Reactive to light. Ears, Nose, Mouth, and Throat Lips, teeth, and gums WNL.Marland Kitchen Moist mucosa without lesions. Neck supple and nontender. No palpable supraclavicular or cervical adenopathy. Normal sized without goiter. Respiratory WNL. No retractions.. Cardiovascular Pedal Pulses WNL. No clubbing, cyanosis or edema. Chest Breasts symmetical and no nipple discharge.. Breast tissue WNL, no masses, lumps, or tenderness.. Lymphatic No adneopathy. No adenopathy. No adenopathy. Musculoskeletal Adexa without tenderness or enlargement.. Digits and nails w/o clubbing, cyanosis, infection, petechiae, ischemia, or inflammatory conditions.Marland Kitchen Psychiatric Judgement and insight Intact.. No evidence  of depression, anxiety, or agitation.. General Notes: the patient's legs have gross stage II lymphedema with severe cellulitis right up to the knees. She has weeping and micro-ulcerations left worse than right. Integumentary (Hair, Skin) No suspicious lesions. No crepitus or fluctuance. No peri-wound warmth or erythema. 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 Admission as an inpatient with IV antibiotics, elevation and local wound care. 2. Discussed with the vascular office and concur with Dr. Marijean Heath advice.  3. Recommended she goes to the ER for admission and appropriate care. 4. once discharged from the hospital she can come back to see [place] for taking care of her chronic lymphedema with an appropriate Profore wrap Plan Primary Wound Dressing: Wound #1 Left,Circumferential Lower Leg: Aquacel Ag Wound #2 Right,Circumferential Lower Leg: Aquacel Ag Secondary Dressing: Wound #1 Left,Circumferential Lower Leg: Conform/Kerlix Wound #2 Right,Circumferential Lower Leg: Conform/Kerlix General Notes: patient to report to ER per AVV for admission. BRAYLEIGH, RYBACKI ([id number]) after the review of her patient's lower extremities and discussing with her and her son that she has got significant cellulitis I have recommended: 1. Admission as an inpatient with IV antibiotics, elevation and local  wound care. 2. Discussed with the vascular office and concur with Dr. Marijean Heath advice. 3. Recommended she goes to the ER for admission and appropriate care. 4. once discharged from the hospital she can come back to see [place] for taking care of her chronic lymphedema with an appropriate Profore wrap Electronic Signature(s) Signed: 10/03/2015 4:56:55 PM By: Evlyn Kanner MD, FACS Previous Signature: 10/03/2015 4:40:23 PM Version By: Evlyn Kanner MD, FACS Entered By: Evlyn Kanner on 10/03/2015 16:56:55 Sima, Levora Angel ([id number]) -------------------------------------------------------------------------------- SuperBill Details Patient Name: Demeritt, Alaia L. Date of Service: 10/03/2015 Medical Record Number: [id number] Patient Account Number: [id number] Date of Birth/Sex: 02/25/1941 (75 y.o. 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 E66.3 Overweight L03.116 Cellulitis of left lower limb L03.115 Cellulitis of right lower limb Facility Procedures CPT4 Code: [id number] Description: 99214 - WOUND CARE VISIT-LEV 4 EST PT Modifier: Quantity: 1 Physician Procedures CPT4: Description Modifier Quantity Code [id number] 99214 - WC PHYS LEVEL 4 - EST PT 1 ICD-10 Description Diagnosis I87.031 Postthrombotic syndrome with ulcer and inflammation of right lower extremity L03.116 Cellulitis of left lower limb L03.115 Cellulitis  of right lower limb L97.222 Non-pressure chronic ulcer of left calf with fat  layer exposed Electronic Signature(s) Signed: 10/03/2015 5:02:11 PM By: Evlyn Kanner MD, FACS Signed: 10/03/2015 5:13:11 PM By: Lucrezia Starch RN, Sendra Previous Signature: 10/03/2015 4:40:42 PM Version By: Evlyn Kanner MD, FACS Entered By: Lucrezia Starch RN, Rosalio Macadamia on 10/03/2015 16:59:28

## 2015-10-03 NOTE — H&P (Signed)
[name] - [name]   PATIENT NAME: [name]    MR#:  [id number]  DATE OF BIRTH:  12-24-1940  DATE OF ADMISSION:  10/03/2015  PRIMARY CARE PHYSICIAN: Elizabeth Sauer, MD   REQUESTING/REFERRING PHYSICIAN: York Cerise, M.D.  CHIEF COMPLAINT:   Chief Complaint  Patient presents with  . Wound Infection    HISTORY OF PRESENT ILLNESS:  Thanvi [name]  is a 75 y.o. female who presents with progressive lower showing cellulitis. Patient states that about a week ago her legs began growing angry red. She has baseline discoloration due to peripheral vascular disease, but her erythema became more prominent. She then developed stinging pain sensation, and then warmth. She went to see her vascular surgeon today and he told her that he felt like she needed to be admitted, and to see her wound doctor and get his opinion. Wound doctor corroborated same advice, and she came to the hospital for admission. Here she is found to be hemodynamically stable, somewhat tachycardic in A. fib. She states her A. fib is chronic. Her white count was normal. However, given her peripheral vascular disease and her history of recurrent difficult to treat cellulitis, hospitalists were called for admission and treatment with IV antibiotics.  PAST MEDICAL HISTORY:   Past Medical History  Diagnosis Date  . Hypertension   . Atrial fibrillation (HCC)   . HLD (hyperlipidemia)   . Valvular heart disease   . Pulmonary HTN (HCC)   . Lower extremity edema   . HTN (hypertension) 12/18/2014  . A-fib (HCC) 12/18/2014  . Benign essential HTN 10/25/2014  . Chronic venous insufficiency 12/29/2014  . Chronic systolic heart failure (HCC) 05/18/2014    Overview:  Global ef 35%   . 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 Bilateral lower extremity edema 12/18/2014  . Cellulitis of leg, right 12/18/2014  . History of repair of hip joint 09/19/2014  . Carpal tunnel syndrome 12/30/2014  . Osteoarthrosis,  shoulder region 12/29/2014  . Degenerative arthritis of hip 06/08/2014    PAST SURGICAL HISTORY:   Past Surgical History  Procedure Laterality Date  . Abdominal hysterectomy    . Bilateral wrist surgery    . Left hip replacement    . Cataract surgery Bilateral   . Bedside evacuation of hematoma Right 03/16/2015    Procedure:  EVACUATION OF HEMATOMA;  Surgeon: Renford Dills, MD;  Location: [facility] ORS;  Service: Vascular;  Laterality: Right;  . Minor application of wound vac Right 03/16/2015    Procedure: MINOR APPLICATION OF WOUND VAC;  Surgeon: Renford Dills, MD;  Location: [facility] OR[facility];  Service: Vascular;  Laterality: Right;  . Joint replacement Left 06/2014    hip  . I&d extremity Right 04/06/2015    Procedure: IRRIGATION AND DEBRIDEMENT EXTREMITY;  Surgeon: Renford Dills, MD;  Location: [facility] OR[facility];  Service: Vascular;  Laterality: Right;  Wound matrix skin graft and wound vac placement    SOCIAL HISTORY:   Social History  Substance Use Topics  . Smoking status: Former Smoker    Quit date: 04/01/1984  . Smokeless tobacco: Never Used  . Alcohol Use: No    FAMILY HISTORY:   Family History  Problem Relation Age of Onset  . Heart attack Father   . Stroke Mother     DRUG ALLERGIES:  No Known Allergies  MEDICATIONS AT HOME:   Prior to Admission medications   Medication Sig Start Date End Date Taking? Authorizing Provider  acetaminophen (TYLENOL) 325  MG tablet Take 650 mg by mouth every 6 (six) hours as needed.    Historical Provider, MD  acyclovir (ZOVIRAX) 200 MG capsule Take 1 capsule (200 mg total) by mouth 5 (five) times daily. 07/29/15   Duanne Limerick, MD  carvedilol (COREG) 25 MG tablet Take 25 mg by mouth 2 (two) times daily with a meal.  09/22/14   Historical Provider, MD  ciprofloxacin (CIPRO) 250 MG tablet Take 1 tablet (250 mg total) by mouth 2 (two) times daily. 09/01/15   Duanne Limerick, MD  clotrimazole-betamethasone (LOTRISONE) cream Apply 1 application  topically 2 (two) times daily. 07/29/15   Duanne Limerick, MD  docusate sodium (COLACE) 100 MG capsule Take 2 capsules (200 mg total) by mouth 2 (two) times daily. 03/18/15   Adrian Saran, MD  ferrous sulfate 325 (65 FE) MG tablet Take 1 tablet (325 mg total) by mouth 2 (two) times daily with a meal. 03/18/15   Adrian Saran, MD  furosemide (LASIX) 40 MG tablet Take 40 mg by mouth every morning.    Historical Provider, MD  gabapentin (NEURONTIN) 300 MG capsule Take 300 mg by mouth 2 (two) times daily. Reported on 08/22/2015    Historical Provider, MD    REVIEW OF SYSTEMS:  Review of Systems  Constitutional: Negative for fever, chills, weight loss and malaise/fatigue.  HENT: Negative for ear pain, hearing loss and tinnitus.   Eyes: Negative for blurred vision, double vision, pain and redness.  Respiratory: Negative for cough, hemoptysis and shortness of breath.   Cardiovascular: Negative for chest pain, palpitations, orthopnea and leg swelling.  Gastrointestinal: Negative for nausea, vomiting, abdominal pain, diarrhea and constipation.  Genitourinary: Negative for dysuria, frequency and hematuria.  Musculoskeletal: Negative for back pain, joint pain and neck pain.  Skin:       Bilateral descending lower extremity erythema, left greater than right  Neurological: Negative for dizziness, tremors, focal weakness and weakness.  Endo/Heme/Allergies: Negative for polydipsia. Does not bruise/bleed easily.  Psychiatric/Behavioral: Negative for depression. The patient is not nervous/anxious and does not have insomnia.      VITAL SIGNS:   Filed Vitals:   10/03/15 1713 10/03/15 2029 10/03/15 2030  BP: 120/63 117/57 117/57  Pulse:  122 115  Temp: 98.3 F (36.8 C)    TempSrc: Oral    Resp: Height:  (1.702 m)    Weight: 88.905 kg (196 lb)    SpO2:  100% 100%   Wt Readings from Last 3 Encounters:  10/03/15 88.905 kg (196 lb)  09/01/15 92.987 kg (205 lb)  08/22/15 93.441 kg (206 lb)     PHYSICAL EXAMINATION:  Physical Exam  Vitals reviewed. Constitutional: She is oriented to person, place, and time. She appears well-developed and well-nourished. No distress.  HENT:  Head: Normocephalic and atraumatic.  Mouth/Throat: Oropharynx is clear and moist.  Eyes: Conjunctivae and EOM are normal. Pupils are equal, round, and reactive to light. No scleral icterus.  Neck: Normal range of motion. Neck supple. No JVD present. No thyromegaly present.  Cardiovascular: Intact distal pulses.  Exam reveals no gallop and no friction rub.   No murmur heard. Tachycardic, irregular rhythm  Respiratory: Effort normal and breath sounds normal. No respiratory distress. She has no wheezes. She has no rales.  GI: Soft. Bowel sounds are normal. She exhibits no distension. There is no tenderness.  Musculoskeletal: Normal range of motion. She exhibits tenderness (Bilateral lower extremities). She exhibits no edema.  No arthritis, no  gout  Lymphadenopathy:    She has no cervical adenopathy.  Neurological: She is alert and oriented to person, place, and time. No cranial nerve deficit.  No dysarthria, no aphasia  Skin: Skin is warm and dry. No rash noted. There is erythema (ilateral lower emities, left greater tght).  Psychiatric: She has a normal mood and affect. Her behavior is normal. Judgment and thought content normal.    LABORATORY PANEL:   CBC  Recent Labs Lab 10/03/15 2009  WBC 10.2  HGB 9.9*  HCT 30.9*  PLT 299   ------------------------------------------------------------------------------------------------------------------  Chemistries   Recent Labs Lab 10/03/15 2009  NA 135  K 3.7  CL 99*  CO2 31  GLUCOSE 97  BUN 19  CREATININE 0.87  CALCIUM 8.5*  AST 22  ALT 13*  ALKPHOS 82  BILITOT 0.5   ------------------------------------------------------------------------------------------------------------------  Cardiac Enzymes  Recent Labs Lab 10/03/15 2009   TROPONINI <0.03   ------------------------------------------------------------------------------------------------------------------  RADIOLOGY:  Dg Chest 2 View  10/03/2015  CLINICAL DATA:  Shortness of breath. EXAM: CHEST  2 VIEW COMPARISON:  March 31, 2015. FINDINGS: Stable cardiomegaly. No pneumothorax or pleural effusion is noted. No acute pulmonary disease is noted. Bony thorax is unremarkable. IMPRESSION: No active cardiopulmonary disease. Electronically Signed   By: Lupita RaiderJames  Green Jr, M.D.   On: 10/03/2015 18:43    EKG:   Orders placed or performed during the hospital encounter of 10/03/15  . ED EKG  . ED EKG    IMPRESSION AND PLAN:  Principal Problem:   Cellulitis of leg, bilateral - requiring IV antibiotics. These were started in the ED, we'll continue on admission. We'll admit the patient for the same. However blood cell count is normal. We will get a vascular surgery consult, as well as further recommendation she come in for treatment, and that she follows with him routinely given her peripheral vascular disease. Active Problems:   HTN (hypertension) - currently hypertensive, continue home meds. If after dosing home and she remains greater than 160/100, can use additional when necessary antihypertensives.   Chronic a-fib (HCC) - continue home rate controlling medications, she seems to be in A. fib fairly chronically, we would like a goal rate less than 110. We'll give some IV hydration which could potentially help this as well.   Chronic systolic heart failure (HCC) - we'll give gentle hydration, and continue home meds for this.   Venous insufficiency of leg - vascular surgical consult as above, likely contributes to the difficulty she has been treating her cellulitis, and increases risk for recurrence   HLD (hyperlipidemia) - continue home statin  All the records are reviewed and case discussed with ED provider. Management plans discussed with the patient and/or  family.  DVT PROPHYLAXIS: SubQ lovenox  GI PROPHYLAXIS: None  ADMISSION STATUS: Inpatient  CODE STATUS: Full Code Status History    Date Active Date Inactive Code Status Order ID Comments User Context   03/15/2015 12:26 PM 03/18/2015  9:25 PM Full Code [id number]  Ramonita LabAruna Gouru, MD Inpatient   12/18/2014 10:27 PM 12/21/2014  4:31 PM Full Code [id number]  Oralia Manisavid Berthel Bagnall, MD Inpatient    Advance Directive Documentation        Most Recent Value   Type of Advance Directive  Living will   Pre-existing out of facility DNR order (yellow form or pink MOST form)     "MOST" Form in Place?        TOTAL TIME TAKING CARE OF THIS PATIENT: 45 minutes.  Deaundre Allston FIELDING 10/03/2015, 9:24 PM  [facility]  Office  [phone]  CC: Primary care physician; Elizabeth Sauer, MD

## 2015-10-04 LAB — BASIC METABOLIC PANEL
ANION GAP: 4 — AB (ref 5–15)
BUN: 13 mg/dL (ref 6–20)
CALCIUM: 7.8 mg/dL — AB (ref 8.9–10.3)
CO2: 28 mmol/L (ref 22–32)
Chloride: 103 mmol/L (ref 101–111)
Creatinine, Ser: 0.76 mg/dL (ref 0.44–1.00)
GFR calc Af Amer: >60 mL/min (ref >60)
GFR calc non Af Amer: >60 mL/min (ref >60)
GLUCOSE: 113 mg/dL — AB (ref 65–99)
Potassium: 3.6 mmol/L (ref 3.5–5.1)
Sodium: 135 mmol/L (ref 135–145)

## 2015-10-04 LAB — CBC
HEMATOCRIT: 26 % — AB (ref 35.0–47.0)
HEMOGLOBIN: 8.2 g/dL — AB (ref 12.0–16.0)
MCH: 23.8 pg — ABNORMAL LOW (ref 26.0–34.0)
MCHC: 31.6 g/dL — ABNORMAL LOW (ref 32.0–36.0)
MCV: 75.3 fL — AB (ref 80.0–100.0)
Platelets: 228 10*3/uL (ref 150–440)
RBC: 3.45 MIL/uL — AB (ref 3.80–5.20)
RDW: 18.4 % — ABNORMAL HIGH (ref 11.5–14.5)
WBC: 8.3 10*3/uL (ref 3.6–11.0)

## 2015-10-04 NOTE — Progress Notes (Signed)
[facility]   PATIENT NAME: [name]    MR#:  [id number]  DATE OF BIRTH:  07-04-1941  SUBJECTIVE:  CHIEF COMPLAINT:  Reporting lower extremity leg swelling and redness progressively getting worse  REVIEW OF SYSTEMS:  CONSTITUTIONAL: No fever, fatigue or weakness.  EYES: No blurred or double vision.  EARS, NOSE, AND THROAT: No tinnitus or ear pain.  RESPIRATORY: No cough, shortness of breath, wheezing or hemoptysis.  CARDIOVASCULAR: No chest pain, orthopnea, edema.  GASTROINTESTINAL: No nausea, vomiting, diarrhea or abdominal pain.  GENITOURINARY: No dysuria, hematuria.  ENDOCRINE: No polyuria, nocturia,  HEMATOLOGY: No anemia, easy bruising or bleeding SKIN: Bilateral descending lower extremity erythema, left greater than right  MUSCULOSKELETAL: No joint pain or arthritis.   NEUROLOGIC: No tingling, numbness, weakness.  PSYCHIATRY: No anxiety or depression.   DRUG ALLERGIES:  No Known Allergies  VITALS:  Blood pressure 98/55, pulse 87, temperature 98.4 F (36.9 C), temperature source Oral, resp. rate 18, height  (1.702 m), weight 92.579 kg (204 lb 1.6 oz), SpO2 100 %.  PHYSICAL EXAMINATION:  GENERAL:  75 y.o.-year-old patient lying in the bed with no acute distress.  EYES: Pupils equal, round, reactive to light and accommodation. No scleral icterus. Extraocular muscles intact.  HEENT: Head atraumatic, normocephalic. Oropharynx and nasopharynx clear.  NECK:  Supple, no jugular venous distention. No thyroid enlargement, no tenderness.  LUNGS: Normal breath sounds bilaterally, no wheezing, rales,rhonchi or crepitation. No use of accessory muscles of respiration.  CARDIOVASCULAR: S1, S2 normal. No murmurs, rubs, or gallops.  ABDOMEN: Soft, nontender, nondistended. Bowel sounds present. No organomegaly or mass.  EXTREMITIES: No pedal edema, cyanosis, or clubbing.  NEUROLOGIC: Cranial nerves II through XII are intact.  Muscle strength 5/5 in all extremities. Sensation intact. Gait not checked.  PSYCHIATRIC: The patient is alert and oriented x 3.  SKIN: Bilateral lower extremity with erythematous and the squamous patches with tenderness left extremity greater than right    LABORATORY PANEL:   CBC  Recent Labs Lab 10/04/15 0408  WBC 8.3  HGB 8.2*  HCT 26.0*  PLT 228   ------------------------------------------------------------------------------------------------------------------  Chemistries   Recent Labs Lab 10/03/15 2009 10/04/15 0408  NA 135 135  K 3.7 3.6  CL 99* 103  CO2 31 28  GLUCOSE 97 113*  BUN 19 13  CREATININE 0.87 0.76  CALCIUM 8.5* 7.8*  AST 22  --   ALT 13*  --   ALKPHOS 82  --   BILITOT 0.5  --    ------------------------------------------------------------------------------------------------------------------  Cardiac Enzymes  Recent Labs Lab 10/03/15 2009  TROPONINI <0.03   ------------------------------------------------------------------------------------------------------------------  RADIOLOGY:  Dg Chest 2 View  10/03/2015  CLINICAL DATA:  Shortness of breath. EXAM: CHEST  2 VIEW COMPARISON:  March 31, 2015. FINDINGS: Stable cardiomegaly. No pneumothorax or pleural effusion is noted. No acute pulmonary disease is noted. Bony thorax is unremarkable. IMPRESSION: No active cardiopulmonary disease. Electronically Signed   By: Lupita Raider, M.D.   On: 10/03/2015 18:43    EKG:   Orders placed or performed during the hospital encounter of 10/03/15  . ED EKG  . ED EKG    ASSESSMENT AND PLAN:    -  bilateral lower extremity cellulitis with history of peripheral vascular disease Continue IV vancomycin and Zosyn. Vascular surgery consult is placed which is pending   check a.m. labs   HTN (hypertension) - stable. Continue home medications    Chronic a-fib (HCC) - continue home  rate controlling medications,  IV fluids    Chronic systolic  heart failure (HCC) - we'll give gentle hydration, and continue home meds for this.monitor for symptoms and signs of fluid overload    Venous insufficiency of leg - vascular surgical consult as above, likely contributes to the difficulty she has been treating her cellulitis, and increases risk for recurrence   HLD (hyperlipidemia) - continue home statin      All the records are reviewed and case discussed with Care Management/Social Workerr. Management plans discussed with the patient, family and they are in agreement.  CODE STATUS: fc  TOTAL TIME TAKING CARE OF THIS PATIENT: 35  minutes.   POSSIBLE D/C IN 2-3  DAYS, DEPENDING ON CLINICAL CONDITION.   Ramonita LabGouru, Molley Houser M.D on 10/04/2015 at 3:31 PM  Between 7am to 6pm - Pager - [phone] After 6pm go to [email or web address] - password [email or web address] [facility]  [facility]  Office  [phone]  CC: Primary care physician; Elizabeth Sauereanna Jones, MD

## 2015-10-04 NOTE — Care Management Note (Signed)
Case Management Note  Patient Details  Name: [name] MRN: [id number] Date of Birth: Aug 22, 1940  Subjective/Objective:              Patient admitted for cellulitis. Patient lives at home alone.  States that her granddaughter and son live locally for support.  She has a friend that lives across the street who comes and visits daily.  Patient obtains her medications from [facility] [address]  Patient has a RW and can in the home.  Drives herself when needed, or if the weather is bad her son transports her.  Currently patient is on acute O2.  If needed at time of discharge will need qualifying O2 saturations.  Patient is [facility].  Will need resumption of home health orders.         Action/Plan: RNCM following for discharge planning.   Expected Discharge Date:                  Expected Discharge Plan:     In-House Referral:     Discharge planning Services     Post Acute Care Choice:    Choice offered to:     DME Arranged:    DME Agency:     HH Arranged:    HH Agency:     Status of Service:     Medicare Important Message Given:    Date Medicare IM Given:    Medicare IM give by:    Date Additional Medicare IM Given:    Additional Medicare Important Message give by:     If discussed at Long Length of Stay Meetings, dates discussed:    Additional Comments:  Chapman FitchBOWEN, Luis Sami T, RN 10/04/2015, 2:54 PM

## 2015-10-04 NOTE — Progress Notes (Signed)
Marie, [name] ([id number]) Visit Report for 10/03/2015 Arrival Information Details Patient Name: [name], [name] 10/03/2015 3:30 Date of Service: PM Medical Record [id number] Number: Patient Account Number: [id number] 02/21/1941 (75 y.o. Treating RN: Leonard Downing Date of Birth/Sex: Female) Other Clinician: Primary Care Physician: Elizabeth Sauer Treating Britto, Errol Referring Physician: Elizabeth Sauer Physician/Extender: Tania Ade in Treatment: 1 Visit Information History Since Last Visit All ordered tests and consults were completed: No Patient Arrived: Dan Humphreys Added or deleted any medications: No Arrival Time: 15:59 Any new allergies or adverse reactions: No Accompanied By: self Had a fall or experienced change in No Transfer Assistance: None activities of daily living that may affect Patient Identification Verified: Yes risk of falls: Secondary Verification Process Yes Signs or symptoms of abuse/neglect since last No Completed: visito Patient Has Alerts: Yes Hospitalized since last visit: No Patient Alerts: ABI: (R)1.03 done Has Dressing in Place as Prescribed: Yes at AVV Has Compression in Place as Prescribed: No ABI: ([name])10.2 done at AVV Pain Present Now: No Notes patient was seen in AVV this morning who removed wraps Electronic Signature(s) Signed: 10/03/2015 5:13:11 PM By: Lucrezia Starch, RN, Sendra Entered By: Lucrezia Starch RN, Sendra on 10/03/2015 16:47:27 Ranker, Levora Angel ([id number]) -------------------------------------------------------------------------------- Clinic Level of Care Assessment Details Patient Name: Marie, [name] 10/03/2015 3:30 Date of Service: PM Medical Record [id number] Number: Patient Account Number: [id number] 04-30-1941 (74 y.o. Treating RN: Leonard Downing Date of Birth/Sex: Female) Other Clinician: Primary Care Physician: Elizabeth Sauer Treating Britto, Errol Referring Physician: Elizabeth Sauer Physician/Extender: Tania Ade in  Treatment: 1 Clinic Level of Care Assessment Items TOOL 4 Quantity Score X - Use when only an EandM is performed on FOLLOW-UP visit 1 0 ASSESSMENTS - Nursing Assessment / Reassessment X - Reassessment of Co-morbidities (includes updates in patient status) 1 10 X - Reassessment of Adherence to Treatment Plan 1 5 ASSESSMENTS - Wound and Skin Assessment / Reassessment  - Simple Wound Assessment / Reassessment - one wound 0 X - Complex Wound Assessment / Reassessment - multiple wounds 2 5  - Dermatologic / Skin Assessment (not related to wound area) 0 ASSESSMENTS - Focused Assessment X - Circumferential Edema Measurements - multi extremities 2 5  - Nutritional Assessment / Counseling / Intervention 0 X - Lower Extremity Assessment (monofilament, tuning fork, pulses) 1 5  - Peripheral Arterial Disease Assessment (using hand held doppler) 0 ASSESSMENTS - Ostomy and/or Continence Assessment and Care  - Incontinence Assessment and Management 0  - Ostomy Care Assessment and Management (repouching, etc.) 0 PROCESS - Coordination of Care X - Simple Patient / Family Education for ongoing care 1 15  - Complex (extensive) Patient / Family Education for ongoing care 0 X - Staff obtains Chiropractor, Records, Test Results / Process Orders 1 10 X - Staff telephones HHA, Nursing Homes / Clarify orders / etc 1 10 Korenek, Samya [name]. ([id number])  - Routine Transfer to another Facility (non-emergent condition) 0  - Routine Hospital Admission (non-emergent condition) 0  - New Admissions / Manufacturing engineer / Ordering NPWT, Apligraf, etc. 0  - Emergency Hospital Admission (emergent condition) 0 X - Simple Discharge Coordination 1 10  - Complex (extensive) Discharge Coordination 0 PROCESS - Special Needs  - Pediatric / Minor Patient Management 0  - Isolation Patient Management 0  - Hearing / Language / Visual special needs 0  - Assessment of Community assistance  (transportation, D/C planning, etc.) 0  - Additional assistance / Altered mentation 0  - Support Surface(s) Assessment (bed, cushion, seat, etc.) 0  INTERVENTIONS - Wound Cleansing / Measurement []  - Simple Wound Cleansing - one wound 0 []  - Complex Wound Cleansing - multiple wounds 0 []  - Wound Imaging (photographs - any number of wounds) 0 []  - Wound Tracing (instead of photographs) 0 []  - Simple Wound Measurement - one wound 0 []  - Complex Wound Measurement - multiple wounds 0 INTERVENTIONS - Wound Dressings []  - Small Wound Dressing one or multiple wounds 0 []  - Medium Wound Dressing one or multiple wounds 0 X - Large Wound Dressing one or multiple wounds 2 20 []  - Application of Medications - topical 0 []  - Application of Medications - injection 0 Kotch, Briena [name]. ([id number]) INTERVENTIONS - Miscellaneous []  - External ear exam 0 []  - Specimen Collection (cultures, biopsies, blood, body fluids, etc.) 0 []  - Specimen(s) / Culture(s) sent or taken to Lab for analysis 0 []  - Patient Transfer (multiple staff / Michiel SitesHoyer Lift / Similar devices) 0 []  - Simple Staple / Suture removal (25 or less) 0 []  - Complex Staple / Suture removal (26 or more) 0 []  - Hypo / Hyperglycemic Management (close monitor of Blood Glucose) 0 []  - Ankle / Brachial Index (ABI) - do not check if billed separately 0 X - Vital Signs 1 5 Has the patient been seen at the hospital within the last three years: Yes Total Score: 130 Level Of Care: New/Established - Level 4 Electronic Signature(s) Signed: 10/03/2015 5:13:11 PM By: Lucrezia Starchoseboro, RN, Sendra Entered By: Lucrezia Starchoseboro, RN, Sendra on 10/03/2015 16:59:19 Bornhorst, Levora AngelANNIE [name]. ([id number]) -------------------------------------------------------------------------------- Encounter Discharge Information Details Patient Name: [name]. 10/03/2015 3:30 Date of Service: PM Medical Record [id number] Number: Patient Account Number: [id number] 13-Aug-1940 (74 y.o.  Treating RN: Leonard Downingoseboro, Sendra Date of Birth/Sex: Female) Other Clinician: Primary Care Physician: Elizabeth SauerJones, Deanna Treating Evlyn KannerBritto, Errol Referring Physician: Elizabeth SauerJones, Deanna Physician/Extender: Tania AdeWeeks in Treatment: 1 Encounter Discharge Information Items Discharge Pain Level: 0 Discharge Condition: Stable Ambulatory Status: Walker Emergency Discharge Destination: Room Transportation: Private Auto Accompanied By: son Schedule Follow-up Appointment: No Medication Reconciliation completed and provided to Patient/Care No Thedore Pickel: Provided on Clinical Summary of Care: 10/03/2015 Form Type Recipient Paper Patient AS Notes patient to report to ED per AVV recommendations Electronic Signature(s) Signed: 10/03/2015 5:13:11 PM By: Lucrezia Starchoseboro, RN, Sendra Previous Signature: 10/03/2015 4:43:23 PM Version By: Gwenlyn PerkingMoore, Shelia Entered By: Lucrezia Starchoseboro, RN, Sendra on 10/03/2015 16:57:46 Okuda, Levora AngelANNIE [name]. ([id number]) -------------------------------------------------------------------------------- Lower Extremity Assessment Details Patient Name: Marie MaplesSIMMONS, Shanele [name]. 10/03/2015 3:30 Date of Service: PM Medical Record [id number] Number: Patient Account Number: [id number] 13-Aug-1940 (74 y.o. Treating RN: Leonard Downingoseboro, Sendra Date of Birth/Sex: Female) Other Clinician: Primary Care Physician: Elizabeth SauerJones, Deanna Treating Evlyn KannerBritto, Errol Referring Physician: Elizabeth SauerJones, Deanna Physician/Extender: Tania AdeWeeks in Treatment: 1 Edema Assessment Assessed: [Left: No] [Right: No] E[Left: dema] [Right: :] Calf Left: Right: Point of Measurement: cm From Medial Instep 48 cm 41 cm Ankle Left: Right: Point of Measurement: cm From Medial Instep 37 cm 39 cm Electronic Signature(s) Signed: 10/03/2015 5:13:11 PM By: Lucrezia Starchoseboro, RN, Sendra Entered By: Lucrezia Starchoseboro, RN, Sendra on 10/03/2015 16:07:32 Lehan, Levora AngelANNIE [name]. ([id number]) -------------------------------------------------------------------------------- Pain Assessment Details Patient  Name: Marie MaplesSIMMONS, Nakiah [name]. 10/03/2015 3:30 Date of Service: PM Medical Record [id number] Number: Patient Account Number: [id number] 13-Aug-1940 (74 y.o. Treating RN: Leonard Downingoseboro, Sendra Date of Birth/Sex: Female) Other Clinician: Primary Care Physician: Elizabeth SauerJones, Deanna Treating Evlyn KannerBritto, Errol Referring Physician: Elizabeth SauerJones, Deanna Physician/Extender: Tania AdeWeeks in Treatment: 1 Active Problems Location of Pain Severity and Description of Pain Patient Has Paino No Site Locations Rate the pain. Current Pain Level: 0  Pain Management and Medication Current Pain Management: Electronic Signature(s) Signed: 10/03/2015 5:13:11 PM By: Lucrezia Starch, RN, Sendra Entered By: Lucrezia Starch RN, Sendra on 10/03/2015 16:01:32 Way, Levora Angel ([id number]) -------------------------------------------------------------------------------- Patient/Caregiver Education Details Patient Name: BRICEIDA, RASBERRY 10/03/2015 3:30 Date of Service: PM Medical Record [id number] Number: Patient Account Number: [id number] Oct 14, 1940 (74 y.o. Treating RN: Leonard Downing Date of Birth/Gender: Female) Other Clinician: Primary Care Physician: Elizabeth Sauer Treating Evlyn Kanner Referring Physician: Elizabeth Sauer Physician/Extender: Tania Ade in Treatment: 1 Education Assessment Education Provided To: Patient Education Topics Provided Wound/Skin Impairment: Handouts: Caring for Your Ulcer, Skin Care Do's and Dont's Methods: Explain/Verbal Responses: State content correctly Electronic Signature(s) Signed: 10/03/2015 5:13:11 PM By: Lucrezia Starch, RN, Sendra Entered By: Lucrezia Starch RN, Sendra on 10/03/2015 16:57:57 Venneman, Levora Angel ([id number]) -------------------------------------------------------------------------------- Vitals Details Patient Name: Marie Maples 10/03/2015 3:30 Date of Service: PM Medical Record [id number] Number: Patient Account Number: [id number] 01/23/1941 (74 y.o. Treating RN: Leonard Downing Date of  Birth/Sex: Female) Other Clinician: Primary Care Physician: Elizabeth Sauer Treating Britto, Errol Referring Physician: Elizabeth Sauer Physician/Extender: Tania Ade in Treatment: 1 Vital Signs Time Taken: 16:02 Temperature (F): 97.7 Pulse (bpm): 86 Blood Pressure (mmHg): 123/71 Reference Range: 80 - 120 mg / dl Electronic Signature(s) Signed: 10/03/2015 5:13:11 PM By: Lucrezia Starch RN, Sendra Entered By: Lucrezia Starch RN, Sendra on 10/03/2015 16:02:35

## 2015-10-05 LAB — BASIC METABOLIC PANEL
Anion gap: 5 (ref 5–15)
BUN: 13 mg/dL (ref 6–20)
CALCIUM: 8.2 mg/dL — AB (ref 8.9–10.3)
CHLORIDE: 100 mmol/L — AB (ref 101–111)
CO2: 31 mmol/L (ref 22–32)
CREATININE: 0.82 mg/dL (ref 0.44–1.00)
GFR calc Af Amer: >60 mL/min (ref >60)
GLUCOSE: 99 mg/dL (ref 65–99)
POTASSIUM: 3.5 mmol/L (ref 3.5–5.1)
Sodium: 136 mmol/L (ref 135–145)

## 2015-10-05 LAB — VANCOMYCIN, TROUGH: Vancomycin Tr: 12 ug/mL (ref 10–20)

## 2015-10-05 LAB — CBC
HEMATOCRIT: 26.2 % — AB (ref 35.0–47.0)
HEMOGLOBIN: 8.3 g/dL — AB (ref 12.0–16.0)
MCH: 23.7 pg — AB (ref 26.0–34.0)
MCHC: 31.5 g/dL — AB (ref 32.0–36.0)
MCV: 75.3 fL — AB (ref 80.0–100.0)
Platelets: 219 10*3/uL (ref 150–440)
RBC: 3.48 MIL/uL — AB (ref 3.80–5.20)
RDW: 18.3 % — ABNORMAL HIGH (ref 11.5–14.5)
WBC: 7 10*3/uL (ref 3.6–11.0)

## 2015-10-05 NOTE — Consult Note (Signed)
[facility] Vascular Consult Note  MRN : [id number]  [name] is a 75 y.o. (1941-01-15) female who presents with chief complaint of  Chief Complaint  Patient presents with  . Wound Infection  .  History of Present Illness: Patient is a 75 year old female who is admitted with cellulitis of bilateral lower extremities. She has had previous outpatient workup which demonstrated some venous disease in the past. She also apparently had some outpatient workup suggesting no significant arterial disease. She has improved her leg pain and redness with 2 days of IV antibiotics. Her swelling is also better although far from resolved. Her left leg is a little worse in the right, but both lower extremity is were affected. There is no injury or trauma or clear inciting event that caused the situation. She reports pain although it has improved since admission. She's been trying to elevate her legs for relief. She had some low-grade fevers and chills but this is better after being on IV antibiotics.  Current Facility-Administered Medications  Medication Dose Route Frequency Provider Last Rate Last Dose  . acetaminophen (TYLENOL) tablet 650 mg  650 mg Oral Q6H PRN Oralia Manis, MD       Or  . acetaminophen (TYLENOL) suppository 650 mg  650 mg Rectal Q6H PRN Oralia Manis, MD      . carvedilol (COREG) tablet 25 mg  25 mg Oral BID WC Oralia Manis, MD   25 mg at 10/05/15 1236  . clotrimazole (LOTRIMIN) 1 % cream   Topical BID Oralia Manis, MD      . docusate sodium (COLACE) capsule 200 mg  200 mg Oral BID Oralia Manis, MD   200 mg at 10/04/15 2158  . enoxaparin (LOVENOX) injection 40 mg  40 mg Subcutaneous Q24H Oralia Manis, MD   40 mg at 10/04/15 2213  . furosemide (LASIX) tablet 40 mg  40 mg Oral Annett Fabian, MD   40 mg at 10/05/15 4098  . gabapentin (NEURONTIN) capsule 300 mg  300 mg Oral BID Oralia Manis, MD   300 mg at 10/05/15 1009  . ondansetron (ZOFRAN) tablet 4 mg   4 mg Oral Q6H PRN Oralia Manis, MD       Or  . ondansetron [facility]) injection 4 mg  4 mg Intravenous Q6H PRN Oralia Manis, MD      . oxyCODONE (Oxy IR/ROXICODONE) immediate release tablet 5 mg  5 mg Oral Q4H PRN Oralia Manis, MD      . piperacillin-tazobactam (ZOSYN) IVPB 3.375 g  3.375 g Intravenous 3 times per day Oralia Manis, MD   3.375 g at 10/05/15 1238  . sodium chloride flush (NS) 0.9 % injection 3 mL  3 mL Intravenous Q12H Oralia Manis, MD   3 mL at 10/05/15 1013  . vancomycin (VANCOCIN) IVPB 750 mg/150 ml premix  750 mg Intravenous Q12H Oralia Manis, MD   750 mg at 10/05/15 1459    Past Medical History  Diagnosis Date  . Hypertension   . Atrial fibrillation (HCC)   . HLD (hyperlipidemia)   . Valvular heart disease   . Pulmonary HTN (HCC)   . Lower extremity edema   . HTN (hypertension) 12/18/2014  . A-fib (HCC) 12/18/2014  . Benign essential HTN 10/25/2014  . Chronic venous insufficiency 12/29/2014  . Chronic systolic heart failure (HCC) 05/18/2014    Overview:  Global ef 35%   . 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 Bilateral lower extremity edema 12/18/2014  . Cellulitis  of leg, right 12/18/2014  . History of repair of hip joint 09/19/2014  . Carpal tunnel syndrome 12/30/2014  . 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 Degenerative arthritis of hip 06/08/2014    Past Surgical History  Procedure Laterality Date  . Abdominal hysterectomy    . Bilateral wrist surgery    . Left hip replacement    . Cataract surgery Bilateral   . Bedside evacuation of hematoma Right 03/16/2015    Procedure:  EVACUATION OF HEMATOMA;  Surgeon: Renford Dills, MD;  Location: [facility] ORS;  Service: Vascular;  Laterality: Right;  . Minor application of wound vac Right 03/16/2015    Procedure: MINOR APPLICATION OF WOUND VAC;  Surgeon: Renford Dills, MD;  Location: [facility] ORS;  Service: Vascular;  Laterality: Right;  . Joint replacement Left 06/2014    hip  . I&d extremity Right 04/06/2015     Procedure: IRRIGATION AND DEBRIDEMENT EXTREMITY;  Surgeon: Renford Dills, MD;  Location: [facility] ORS;  Service: Vascular;  Laterality: Right;  Wound matrix skin graft and wound vac placement    Social History Social History  Substance Use Topics  . Smoking status: Former Smoker    Quit date: 04/01/1984  . Smokeless tobacco: Never Used  . Alcohol Use: No  No IV drug use  Family History Family History  Problem Relation Age of Onset  . Heart attack Father   . Stroke Mother   No history of bleeding or clotting disorders. No history of aneurysms  No Known Allergies   REVIEW OF SYSTEMS (Negative unless checked)  Constitutional: Weight loss  Fever  Chills Cardiac: Chest pain   Chest pressure   Palpitations   Shortness of breath when laying flat   Shortness of breath at rest   Shortness of breath with exertion. Vascular:  Pain in legs with walking   Pain in legs at rest   Pain in legs when laying flat   Claudication   Pain in feet when walking  Pain in feet at rest  Pain in feet when laying flat   History of DVT   Phlebitis   Swelling in legs   Varicose veins   Non-healing ulcers Pulmonary:   Uses home oxygen   Productive cough   Hemoptysis   Wheeze  COPD   Asthma Neurologic:  Dizziness  Blackouts   Seizures   History of stroke   History of TIA  Aphasia   Temporary blindness   Dysphagia   Weakness or numbness in arms   Weakness or numbness in legs Musculoskeletal:  Arthritis   Joint swelling   Joint pain   Low back pain Hematologic:  Easy bruising  Easy bleeding   Hypercoagulable state   Anemic  Hepatitis Gastrointestinal:  Blood in stool   Vomiting blood  Gastroesophageal reflux/heartburn   Difficulty swallowing. Genitourinary:  Chronic kidney disease   Difficult urination  Frequent urination  Burning with urination   Blood in urine Skin:  Rashes   Ulcers    Wounds Psychological:  History of anxiety    History of major depression.  Physical Examination  Filed Vitals:   10/05/15 0517 10/05/15 0846 10/05/15 1136 10/05/15 1313  BP: 107/69 99/60 110/63 104/58  Pulse: 95 94 85 85  Temp: 99 F (37.2 C) 97.8 F (36.6 C) 97.7 F (36.5 C) 98.2 F (36.8 C)  TempSrc: Oral Oral Oral Axillary  Resp: Height:      Weight:      SpO2:  91% 97% 99% 99%   Body mass index is 31.96 kg/(m^2). Gen:  WD/WN, NAD Head: Media/AT, No temporalis wasting. Prominent temp pulse not noted. Ear/Nose/Throat: Hearing grossly intact, nares w/o erythema or drainage, oropharynx w/o Erythema/Exudate Eyes: PERRLA, EOMI.  Neck: Supple, no nuchal rigidity.  No JVD.  Pulmonary:  Good air movement, equal bilaterally.  Cardiac: Irregularly irregular Vascular:  Vessel Right Left  Radial Palpable Palpable  Ulnar Palpable Palpable  Brachial Palpable Palpable  Carotid Palpable, without bruit Palpable, without bruit  Aorta Not palpable N/A  Femoral Palpable Palpable  Popliteal Palpable Palpable  PT Not Palpable Not Palpable  DP Palpable Palpable   Gastrointestinal: soft, non-tender/non-distended. No guarding/reflex. No masses, surgical incisions, or scars. Musculoskeletal: M/S 5/5 throughout.  Extremities without ischemic changes.  No deformity or atrophy. 2+ bilateral lower extremity edema. Neurologic: CN 2-12 intact. Pain and light touch intact in extremities.  Symmetrical.  Speech is fluent. Motor exam as listed above. Psychiatric: Judgment intact, Mood & affect appropriate for pt's clinical situation. Dermatologic: Mild to moderate erythema throughout both lower extremities a little worse on the left than the right. She reports this to be markedly better than it was on admission. She has dry scaling skin but no obvious open ulceration at this time. Lymph : No Cervical, Axillary, or Inguinal lymphadenopathy.      CBC Lab Results  Component Value  Date   WBC 7.0 10/05/2015   HGB 8.3* 10/05/2015   HCT 26.2* 10/05/2015   MCV 75.3* 10/05/2015   PLT 219 10/05/2015    BMET    Component Value Date/Time   NA 136 10/05/2015 0429   NA 134* 07/13/2014 0724   K 3.5 10/05/2015 0429   K 3.7 07/13/2014 0724   CL 100* 10/05/2015 0429   CL 94* 07/13/2014 0724   CO2 31 10/05/2015 0429   CO2 34* 07/13/2014 0724   GLUCOSE 99 10/05/2015 0429   GLUCOSE 86 07/13/2014 0724   BUN 13 10/05/2015 0429   BUN 11 07/13/2014 0724   CREATININE 0.82 10/05/2015 0429   CREATININE 0.86 07/13/2014 0724   CALCIUM 8.2* 10/05/2015 0429   CALCIUM 9.1 07/13/2014 0724   GFRNONAA >60 10/05/2015 0429   GFRNONAA >60 07/13/2014 0724   GFRAA >60 10/05/2015 0429   GFRAA >60 07/13/2014 0724   Estimated Creatinine Clearance: 69.2 mL/min (by C-G formula based on Cr of 0.82).  COAG Lab Results  Component Value Date   INR 1.26 03/31/2015   INR 1.28 03/16/2015   INR 1.18 03/15/2015    Radiology Dg Chest 2 View  10/03/2015  CLINICAL DATA:  Shortness of breath. EXAM: CHEST  2 VIEW COMPARISON:  March 31, 2015. FINDINGS: Stable cardiomegaly. No pneumothorax or pleural effusion is noted. No acute pulmonary disease is noted. Bony thorax is unremarkable. IMPRESSION: No active cardiopulmonary disease. Electronically Signed   By: Lupita RaiderJames  Green Jr, M.D.   On: 10/03/2015 18:43      Assessment/Plan 1. Cellulitis bilateral lower extremities. Improving with antibiotics. Continue leg elevation. Agree with Unna boot placements and IV antibiotics. She should transitioned to oral antibiotics at discharge for at least a 2 week course. 2. Previous workup for vascular disease. By office studies her arterial status was found to be okay with venous disease present. Discussed with the patient that treatment of the venous disease as an outpatient once she has resolved her cellulitis would be considered. We will see her back in the office to discuss this. 3. Leg swelling. Unna boots  and  leg elevation as much as possible. 4. Atrial fibrillation/history of congestive heart failure. Can certainly be contributing to her lower extremity symptoms. Would likely benefit from as much diuresis as tolerated.   Alik Mawson, MD  10/05/2015 5:10 PM

## 2015-10-05 NOTE — Consult Note (Signed)
WOC wound consult note Reason for Consult: Assess bilateral lower extremity edema, cellulitis.  Wears Unna's boots at home.  HH is changing twice weekly and she is using absorbent diapers for excess drainage. Moisture Associated Skin damage from excess effluent.  Will await vascular consult.  Recommend Aquacel Ag to wound bed and Unna's boot.  Wound type:venous  /Infectious Pressure Ulcer POA: No Measurement: Nonintact lesion to right medial lower leg  6 cm x 4 cm x 0.1 cm, peeling epithelium Chronic skin changes to bilateral lower legs, dry and cracked.  Edema from feet to below knee. Erythema present to bilateral lower legs Wound ZOX:WRUEbed:Pink and moist Drainage (amount, consistency, odor) Heavy serosanguinous drainage and serous weeping Periwound:Erythema and edema Dressing procedure/placement/frequency:Cleanse bilateral lower legs with soap and water and pat gently dry.  Apply Aquacel Ag, silver hydrofiber to nonintact lesion on right lower leg.  Wrap legs with zinc layer, secured with Coban. Change Mon/Wed/Fri. WOC team will follow.   Maple HudsonKaren Jameila Keeny RN BSN CWON Pager [phone]

## 2015-10-05 NOTE — Progress Notes (Signed)
Vascular doc paged with no return call. Primary MD was called regarding vascular consult and lack of response from page. MD stated we could put in a wound consult for the wound nurse. Wound nurse was consulted and has came to see the pt, recommendations were put in, but WOC stated that Vascular still needs to see pt before dressings are applied. Primary MD stated she would page vascular. Will continue to monitor.

## 2015-10-05 NOTE — Progress Notes (Signed)
Dr. Wyn Quakerew saw pt and stated that we could proceed with the unna boots the WOC nurse ordered.

## 2015-10-05 NOTE — Progress Notes (Signed)
Dr. Gilda CreaseSchnier stated that him or someone else from Vascular would be up later today to see the pt. Dr. Gilda CreaseSchnier informed that WOC nurse had put in her recommendations already.

## 2015-10-05 NOTE — Progress Notes (Signed)
Vascular MD paged. MD stated to call Dr. Wyn Quakerew to come see the pt. Dr. Wyn Quakerew paged and stated he will come see the pt this afternoon and to wait to apply the unna boots WOC nurse ordered until he assess the pt.

## 2015-10-05 NOTE — Progress Notes (Signed)
[facility] - [place] at [facility]   PATIENT NAME: [name]    MR#:  [id number]  DATE OF BIRTH:  [birth year]  SUBJECTIVE:  CHIEF COMPLAINT:  Reporting lower extremity leg swelling and redness progressively getting worse. Out of bed to chair  REVIEW OF SYSTEMS:  CONSTITUTIONAL: No fever, fatigue or weakness.  EYES: No blurred or double vision.  EARS, NOSE, AND THROAT: No tinnitus or ear pain.  RESPIRATORY: No cough, shortness of breath, wheezing or hemoptysis.  CARDIOVASCULAR: No chest pain, orthopnea, edema.  GASTROINTESTINAL: No nausea, vomiting, diarrhea or abdominal pain.  GENITOURINARY: No dysuria, hematuria.  ENDOCRINE: No polyuria, nocturia,  HEMATOLOGY: No anemia, easy bruising or bleeding SKIN: Bilateral descending lower extremity erythema, left greater than right  MUSCULOSKELETAL: No joint pain or arthritis.   NEUROLOGIC: No tingling, numbness, weakness.  PSYCHIATRY: No anxiety or depression.   DRUG ALLERGIES:  No Known Allergies  VITALS:  Blood pressure 104/58, pulse 85, temperature 98.2 F (36.8 C), temperature source Axillary, resp. rate 19, height  (1.702 m), weight 92.579 kg (204 lb 1.6 oz), SpO2 99 %.  PHYSICAL EXAMINATION:  GENERAL:  75 y.o.-year-old patient lying in the bed with no acute distress.  EYES: Pupils equal, round, reactive to light and accommodation. No scleral icterus. Extraocular muscles intact.  HEENT: Head atraumatic, normocephalic. Oropharynx and nasopharynx clear.  NECK:  Supple, no jugular venous distention. No thyroid enlargement, no tenderness.  LUNGS: Normal breath sounds bilaterally, no wheezing, rales,rhonchi or crepitation. No use of accessory muscles of respiration.  CARDIOVASCULAR: S1, S2 normal. No murmurs, rubs, or gallops.  ABDOMEN: Soft, nontender, nondistended. Bowel sounds present. No organomegaly or mass.  EXTREMITIES: No pedal edema, cyanosis, or clubbing.  NEUROLOGIC: Cranial nerves II  through XII are intact. Muscle strength 5/5 in all extremities. Sensation intact. Gait not checked.  PSYCHIATRIC: The patient is alert and oriented x 3.  SKIN: Bilateral lower extremity with erythematous and the squamous patches with tenderness left extremity greater than right    LABORATORY PANEL:   CBC  Recent Labs Lab 10/05/15 0429  WBC 7.0  HGB 8.3*  HCT 26.2*  PLT 219   ------------------------------------------------------------------------------------------------------------------  Chemistries   Recent Labs Lab 10/03/15 2009  10/05/15 0429  NA 135  < > 136  K 3.7  < > 3.5  CL 99*  < > 100*  CO2 31  < > 31  GLUCOSE 97  < > 99  BUN 19  < > 13  CREATININE 0.87  < > 0.82  CALCIUM 8.5*  < > 8.2*  AST 22  --   --   ALT 13*  --   --   ALKPHOS 82  --   --   BILITOT 0.5  --   --   < > = values in this interval not displayed. ------------------------------------------------------------------------------------------------------------------  Cardiac Enzymes  Recent Labs Lab 10/03/15 2009  TROPONINI <0.03   ------------------------------------------------------------------------------------------------------------------  RADIOLOGY:  Dg Chest 2 View  10/03/2015  CLINICAL DATA:  Shortness of breath. EXAM: CHEST  2 VIEW COMPARISON:  March 31, 2015. FINDINGS: Stable cardiomegaly. No pneumothorax or pleural effusion is noted. No acute pulmonary disease is noted. Bony thorax is unremarkable. IMPRESSION: No active cardiopulmonary disease. Electronically Signed   By: Lupita Raider, M.D.   On: 10/03/2015 18:43    EKG:   Orders placed or performed during the hospital encounter of 10/03/15  . ED EKG  . ED EKG    ASSESSMENT AND PLAN:    -  bilateral lower extremity cellulitis with history of peripheral vascular disease Continue IV vancomycin and Zosyn. Vascular surgery consult is placed , discussed with Dr.Schnier today  check a.m. Labs Appreciate wound care  recommendations.Wrap legs with zinc layer, secured with Coban. Change Mon/Wed/Fri.   HTN (hypertension) - stable. Continue home medications    Chronic a-fib (HCC) - continue home rate controlling medications,  IV fluids    Chronic systolic heart failure (HCC) - we'll give gentle hydration, and continue home meds for this.monitor for symptoms and signs of fluid overload    Venous insufficiency of leg - vascular surgical consult as above, likely contributes to the difficulty she has been treating her cellulitis, and increases risk for recurrence   HLD (hyperlipidemia) - continue home statin      All the records are reviewed and case discussed with Care Management/Social Workerr. Management plans discussed with the patient, family and they are in agreement.  CODE STATUS: fc  TOTAL TIME TAKING CARE OF THIS PATIENT: 35  minutes.   POSSIBLE D/C IN 2-3  DAYS, DEPENDING ON CLINICAL CONDITION.   Ramonita LabGouru, Matilde Markie M.D on 10/05/2015 at 4:09 PM  Between 7am to 6pm - Pager - [phone] After 6pm go to [email or web address] - password [email or web address] [facility]  [facility]  Office  [phone]  CC: Primary care physician; Elizabeth Sauereanna Jones, MD

## 2015-10-05 NOTE — Progress Notes (Signed)
Pharmacy Antibiotic Note  [name] is a 75 y.o. female admitted on 10/03/2015 with cellulitis.  Pharmacy has been consulted for vancomycin and Zosyn dosing.  Patient is currently on day #3 of antibiotics with piperacillin/tazobactam & vancomycin.   Plan: Continue piperacillin/tazobactam 3.375 gm IV Q8H EI  Vancomycin trough drawn prior to 5th dose was therapeutic at 12 mcg/mL. Continue current regimen of vancomycin 750 mg IV q12h.   Goal vancomycin trough is 10 to 15 mcg/mL.    Height: 5\' 7"  (170.2 cm) Weight: 204 lb 1.6 oz (92.579 kg) IBW/kg (Calculated) : 61.6  Temp (24hrs), Avg:98.2 F (36.8 C), Min:97.7 F (36.5 C), Max:99 F (37.2 C)   Recent Labs Lab 10/03/15 2009 10/04/15 0408 10/05/15 0429 10/05/15 1313  WBC 10.2 8.3 7.0  --   CREATININE 0.87 0.76 0.82  --   LATICACIDVEN 1.0  --   --   --   VANCOTROUGH  --   --   --  12    Estimated Creatinine Clearance: 69.2 mL/min (by C-G formula based on Cr of 0.82).    No Known Allergies  Thank you for allowing pharmacy to be a part of this patient's care.  Cindi CarbonMary M Damean Poffenberger, Pharm.D. Clinical Pharmacist 10/05/2015 3:30 PM

## 2015-10-06 MED ORDER — SODIUM CHLORIDE 0.9 % IV SOLN
1.5000 g | Freq: Four times a day (QID) | INTRAVENOUS | Status: DC
  Administered 2015-10-06 – 2015-10-07 (×2): 1.5 g via INTRAVENOUS
  Filled 2015-10-06 (×7): qty 1.5

## 2015-10-06 NOTE — Progress Notes (Signed)
[facility] Physicians - [place] at [facility]   PATIENT NAME: [name]    MR#:  [id number]  DATE OF BIRTH:  May 14, 1941  SUBJECTIVE:  CHIEF COMPLAINT:  Continues to complain of both lower extremity redness but is improved compared to admission  REVIEW OF SYSTEMS:  CONSTITUTIONAL: No fever, fatigue or weakness.  EYES: No blurred or double vision.  EARS, NOSE, AND THROAT: No tinnitus or ear pain.  RESPIRATORY: No cough, shortness of breath, wheezing or hemoptysis.  CARDIOVASCULAR: No chest pain, orthopnea, edema.  GASTROINTESTINAL: No nausea, vomiting, diarrhea or abdominal pain.  GENITOURINARY: No dysuria, hematuria.  ENDOCRINE: No polyuria, nocturia,  HEMATOLOGY: No anemia, easy bruising or bleeding SKIN: Bilateral descending lower extremity erythema, left greater than right  MUSCULOSKELETAL: No joint pain or arthritis.   NEUROLOGIC: No tingling, numbness, weakness.  PSYCHIATRY: No anxiety or depression.   DRUG ALLERGIES:  No Known Allergies  VITALS:  Blood pressure 92/70, pulse 100, temperature 98.1 F (36.7 C), temperature source Oral, resp. rate 20, height  (1.702 m), weight 92.579 kg (204 lb 1.6 oz), SpO2 96 %.  PHYSICAL EXAMINATION:  GENERAL:  75 y.o.-year-old patient lying in the bed with no acute distress.  EYES: Pupils equal, round, reactive to light and accommodation. No scleral icterus. Extraocular muscles intact.  HEENT: Head atraumatic, normocephalic. Oropharynx and nasopharynx clear.  NECK:  Supple, no jugular venous distention. No thyroid enlargement, no tenderness.  LUNGS: Normal breath sounds bilaterally, no wheezing, rales,rhonchi or crepitation. No use of accessory muscles of respiration.  CARDIOVASCULAR: S1, S2 normal. No murmurs, rubs, or gallops.  ABDOMEN: Soft, nontender, nondistended. Bowel sounds present. No organomegaly or mass.  EXTREMITIES: 2+ pedal edema, cyanosis, or clubbing.  NEUROLOGIC: Cranial nerves II through XII are  intact. Muscle strength 5/5 in all extremities. Sensation intact. Gait not checked.  PSYCHIATRIC: The patient is alert and oriented x 3.  SKIN: Bilateral lower extremity with erythematous and the squamous patches with tenderness left extremity greater than right    LABORATORY PANEL:   CBC  Recent Labs Lab 10/05/15 0429  WBC 7.0  HGB 8.3*  HCT 26.2*  PLT 219   ------------------------------------------------------------------------------------------------------------------  Chemistries   Recent Labs Lab 10/03/15 2009  10/05/15 0429  NA 135  < > 136  K 3.7  < > 3.5  CL 99*  < > 100*  CO2 31  < > 31  GLUCOSE 97  < > 99  BUN 19  < > 13  CREATININE 0.87  < > 0.82  CALCIUM 8.5*  < > 8.2*  AST 22  --   --   ALT 13*  --   --   ALKPHOS 82  --   --   BILITOT 0.5  --   --   < > = values in this interval not displayed. ------------------------------------------------------------------------------------------------------------------  Cardiac Enzymes  Recent Labs Lab 10/03/15 2009  TROPONINI <0.03   ------------------------------------------------------------------------------------------------------------------  RADIOLOGY:  No results found.  EKG:   Orders placed or performed during the hospital encounter of 10/03/15  . ED EKG  . ED EKG    ASSESSMENT AND PLAN:    #1 bilateral lower extremity cellulitis with history of peripheral vascular disease Appreciate vascular evaluation they recommended outpatient follow-up Continue vancomycin and I'll change her Zosyn to Unasyn   #2 HTN (hypertension) - continue Lasix and Coreg blood pressure stable   #3 Chronic a-fib (HCC) - continue Coreg  #4 Chronic systolic heart failure (HCC) - we'll give gentle hydration,  and continue home meds for this.monitor for symptoms and signs of fluid overload   #5 Venous insufficiency of leg - vascular surgical consult appreciated  #6 HLD (hyperlipidemia) -  Not on anything now  out patient f/u     All the records are reviewed and case discussed with Care Management/Social Workerr. Management plans discussed with the patient, family and they are in agreement.  CODE STATUS: fc  TOTAL TIME TAKING CARE OF THIS PATIENT: 25 minutes.     [name] M.D on 10/06/2015 at 2:42 PM  Between 7am to 6pm - Pager - [phone] After 6pm go to [email or web address] - password [email or web address] [facility]  [facility]  Office  [phone]  CC: Primary care physician; Elizabeth Sauereanna Jones, MD

## 2015-10-06 NOTE — Progress Notes (Signed)
Pharmacy Antibiotic Note  [name] is a 75 y.o. female admitted on 10/03/2015 with cellulitis.  Pharmacy has been consulted for vancomycin and Zosyn dosing.  Patient is currently on day #4 of antibiotics with piperacillin/tazobactam & vancomycin. Per discussion with MD, will D/C zosyn and change to unasyn.  Plan: Order ampicillin/sulbactam 1.5 g IV q6h  Continue vancomycin 750 mg IV q 12 hours. Vancomycin trough scheduled for 4/1 at 1330 Goal vancomycin trough is 10 to 15 mcg/mL.    Height: 5\' 7"  (170.2 cm) Weight: 204 lb 1.6 oz (92.579 kg) IBW/kg (Calculated) : 61.6  Temp (24hrs), Avg:98 F (36.7 C), Min:97.8 F (36.6 C), Max:98.1 F (36.7 C)   Recent Labs Lab 10/03/15 2009 10/04/15 0408 10/05/15 0429 10/05/15 1313  WBC 10.2 8.3 7.0  --   CREATININE 0.87 0.76 0.82  --   LATICACIDVEN 1.0  --   --   --   VANCOTROUGH  --   --   --  12    Estimated Creatinine Clearance: 69.2 mL/min (by C-G formula based on Cr of 0.82).    No Known Allergies  Thank you for allowing pharmacy to be a part of this patient's care.  Cindi CarbonMary M Dmiyah Liscano, Pharm.D. Clinical Pharmacist 10/06/2015 2:00 PM

## 2015-10-06 NOTE — Care Management Important Message (Signed)
Important Message  Patient Details  Name: [name] MRN: [id number] Date of Birth: 31-Dec-1940   Medicare Important Message Given:  Yes    Chapman FitchBOWEN, Hattie Pine T, RN 10/06/2015, 2:13 PM

## 2015-10-07 MED ORDER — AMOXICILLIN-POT CLAVULANATE 875-125 MG PO TABS: 1.0000 | ORAL_TABLET | Freq: Two times a day (BID) | ORAL | Status: AC

## 2015-10-07 MED ORDER — OXYCODONE-ACETAMINOPHEN 5-325 MG PO TABS: 1.0000 | ORAL_TABLET | Freq: Four times a day (QID) | ORAL | Status: DC | PRN

## 2015-10-07 NOTE — Care Management (Signed)
Patient to discharge today.  Patient was already open to home health RN through [facility].  Resumption order has been placed.  I have notified Elnita MaxwellCheryl with [facility].  Patient has been weaned to room air and does not qualify for home O2.  RNCM signing off.

## 2015-10-07 NOTE — Discharge Instructions (Signed)
°  DIET:  Cardiac diet  DISCHARGE CONDITION:  Stable  ACTIVITY:  Activity as tolerated  OXYGEN:  Home Oxygen: No.   Oxygen Delivery: room air  DISCHARGE LOCATION:  home    ADDITIONAL DISCHARGE INSTRUCTION: Dressing instruction,  Cleanse bilateral lower legs with soap and water and pat gently dry.  Apply Aquacel Ag, silver hydrofiber to nonintact lesion on right lower leg.  Wrap legs with zinc layer, secured with Coban. Change Mon/Wed/Fri.   If you experience worsening of your admission symptoms, develop shortness of breath, life threatening emergency, suicidal or homicidal thoughts you must seek medical attention immediately by calling 911 or calling your MD immediately  if symptoms less severe.  You Must read complete instructions/literature along with all the possible adverse reactions/side effects for all the Medicines you take and that have been prescribed to you. Take any new Medicines after you have completely understood and accpet all the possible adverse reactions/side effects.   Please note  You were cared for by a hospitalist during your hospital stay. If you have any questions about your discharge medications or the care you received while you were in the hospital after you are discharged, you can call the unit and asked to speak with the hospitalist on call if the hospitalist that took care of you is not available. Once you are discharged, your primary care physician will handle any further medical issues. Please note that NO REFILLS for any discharge medications will be authorized once you are discharged, as it is imperative that you return to your primary care physician (or establish a relationship with a primary care physician if you do not have one) for your aftercare needs so that they can reassess your need for medications and monitor your lab values.

## 2015-10-07 NOTE — Progress Notes (Signed)
Pharmacy Antibiotic Note  [name] is a 75 y.o. female admitted on 10/03/2015 with cellulitis.  Pharmacy has been consulted for vancomycin and Zosyn dosing.  Patient is currently on day #5 of antibiotics and is prescribed vancomycin and ampicillin/sulbactam.   Plan: Continue ampicillin/sulbactam 1.5 g IV q6h  Continue vancomycin 750 mg IV q 12 hours Vancomycin trough scheduled for 4/1 at 1330 Goal vancomycin trough is 10 to 15 mcg/mL    Height: 5\' 7"  (170.2 cm) Weight: 204 lb 1.6 oz (92.579 kg) IBW/kg (Calculated) : 61.6  Temp (24hrs), Avg:98.2 F (36.8 C), Min:98.1 F (36.7 C), Max:98.4 F (36.9 C)   Recent Labs Lab 10/03/15 2009 10/04/15 0408 10/05/15 0429 10/05/15 1313  WBC 10.2 8.3 7.0  --   CREATININE 0.87 0.76 0.82  --   LATICACIDVEN 1.0  --   --   --   VANCOTROUGH  --   --   --  12    Estimated Creatinine Clearance: 69.2 mL/min (by C-G formula based on Cr of 0.82).    No Known Allergies  Thank you for allowing pharmacy to be a part of this patient's care.  Cindi CarbonMary M Jamese Trauger, Pharm.D. Clinical Pharmacist 10/07/2015 9:12 AM

## 2015-10-07 NOTE — Discharge Summary (Signed)
[name], 75 y.o., DOB 12/09/1940, MRN [id number]. Admission date: 10/03/2015 Discharge Date 10/07/2015 Primary MD Elizabeth Sauer, MD Admitting Physician Oralia Manis, MD  Admission Diagnosis  Bilateral cellulitis of lower leg [L03.116, L03.115]  Discharge Diagnosis   Principal Problem:   Bilateral cellulitis of lower leg    HTN (hypertension)   Chronic a-fib (HCC)   HLD (hyperlipidemia)   Chronic systolic heart failure (HCC)   Venous insufficiency of leg  Pulmonary hypertension Valvular heart disease Atrial fibrillation Chronic systolic CHF Coronary artery disease  Carpal tunnel syndrome       Hospital Course [name] is a 75 y.o. female who presents with progressive lower showing cellulitis. Patient states that about a week ago her legs began growing angry red. She has baseline discoloration due to peripheral vascular disease, but her erythema became more prominent. She then developed stinging pain sensation, and then warmth. She went to see her vascular surgeon today and he told her that he felt like she needed to be admitted. Patient was hospitalized for bilateral lower extremity cellulitis. She was treated with antibiotics. She was slow to improve. She was seen by Dr. dew who recommended outpatient follow-up. Patient was seen by wound care as well. And recommended changes to to her dressings. Patient doing much better and is stable for discharge with outpatient follow-up.            Consults  vascular surgery  Significant Tests:  See full reports for all details      Dg Chest 2 View  10/03/2015  CLINICAL DATA:  Shortness of breath. EXAM: CHEST  2 VIEW COMPARISON:  March 31, 2015. FINDINGS: Stable cardiomegaly. No pneumothorax or pleural effusion is noted. No acute pulmonary disease is noted. Bony thorax is unremarkable. IMPRESSION: No active cardiopulmonary disease. Electronically Signed   By: Lupita Raider, M.D.   On: 10/03/2015 18:43       Today    Subjective:   [name]  feels well denies any complaint  Objective:   Blood pressure 102/62, pulse 76, temperature 98.2 F (36.8 C), temperature source Oral, resp. rate 16, height  (1.702 m), weight 92.579 kg (204 lb 1.6 oz), SpO2 89 %.  .  Intake/Output Summary (Last 24 hours) at 10/07/15 1524 Last data filed at 10/07/15 1325  Gross per 24 hour  Intake   2746 ml  Output   4050 ml  Net  -1304 ml    Exam VITAL SIGNS: Blood pressure 102/62, pulse 76, temperature 98.2 F (36.8 C), temperature source Oral, resp. rate 16, height  (1.702 m), weight 92.579 kg (204 lb 1.6 oz), SpO2 89 %.  GENERAL:  75 y.o.-year-old patient lying in the bed with no acute distress.  EYES: Pupils equal, round, reactive to light and accommodation. No scleral icterus. Extraocular muscles intact.  HEENT: Head atraumatic, normocephalic. Oropharynx and nasopharynx clear.  NECK:  Supple, no jugular venous distention. No thyroid enlargement, no tenderness.  LUNGS: Normal breath sounds bilaterally, no wheezing, rales,rhonchi or crepitation. No use of accessory muscles of respiration.  CARDIOVASCULAR: S1, S2 normal. No murmurs, rubs, or gallops.  ABDOMEN: Soft, nontender, nondistended. Bowel sounds present. No organomegaly or mass.  EXTREMITIES: No pedal edema, cyanosis, or clubbing.  NEUROLOGIC: Cranial nerves II through XII are intact. Muscle strength 5/5 in all extremities. Sensation intact. Gait not checked.  PSYCHIATRIC: The patient is alert and oriented x 3.  SKIN: No obvious rash, lesion, or ulcer.   Data Review  CBC w Diff: Lab Results  Component Value Date   WBC 7.0 10/05/2015   WBC 7.6 07/06/2014   HGB 8.3* 10/05/2015   HGB 11.8* 07/06/2014   HCT 26.2* 10/05/2015   HCT 36.1 07/06/2014   PLT 219 10/05/2015   PLT 308 07/06/2014   LYMPHOPCT 17 10/03/2015   LYMPHOPCT 25.5 07/06/2014   MONOPCT 10 10/03/2015   MONOPCT 7.3 07/06/2014   EOSPCT 3 10/03/2015   EOSPCT 2.8  07/06/2014   BASOPCT 0 10/03/2015   BASOPCT 0.5 07/06/2014   CMP: Lab Results  Component Value Date   NA 136 10/05/2015   NA 134* 07/13/2014   K 3.5 10/05/2015   K 3.7 07/13/2014   CL 100* 10/05/2015   CL 94* 07/13/2014   CO2 31 10/05/2015   CO2 34* 07/13/2014   BUN 13 10/05/2015   BUN 11 07/13/2014   CREATININE 0.82 10/05/2015   CREATININE 0.86 07/13/2014   PROT 7.3 10/03/2015   ALBUMIN 3.1* 10/03/2015   BILITOT 0.5 10/03/2015   ALKPHOS 82 10/03/2015   AST 22 10/03/2015   ALT 13* 10/03/2015  .  Micro Results Recent Results (from the past 240 hour(s))  Blood culture (routine x 2)     Status: None (Preliminary result)   Collection Time: 10/03/15  8:09 PM  Result Value Ref Range Status   Specimen Description BLOOD RIGHT ARM  Final   Special Requests BOTTLES DRAWN AEROBIC AND ANAEROBIC  5CC  Final   Culture NO GROWTH 4 DAYS  Final   Report Status PENDING  Incomplete  Blood culture (routine x 2)     Status: None (Preliminary result)   Collection Time: 10/03/15  8:09 PM  Result Value Ref Range Status   Specimen Description BLOOD RIGHT ASSIST CONTROL  Final   Special Requests BOTTLES DRAWN AEROBIC AND ANAEROBIC  5CC  Final   Culture NO GROWTH 4 DAYS  Final   Report Status PENDING  Incomplete        Code Status Orders        Start     Ordered   10/03/15 2345  Full code   Continuous     10/03/15 2344    Code Status History    Date Active Date Inactive Code Status Order ID Comments User Context   03/15/2015 12:26 PM 03/18/2015  9:25 PM Full Code [id number]  Ramonita Lab, MD Inpatient   12/18/2014 10:27 PM 12/21/2014  4:31 PM Full Code [id number]  Oralia Manis, MD Inpatient    Advance Directive Documentation        Most Recent Value   Type of Advance Directive  Living will   Pre-existing out of facility DNR order (yellow form or pink MOST form)     "MOST" Form in Place?            Follow-up Information    Follow up with Elizabeth Sauer, MD. Go on 10/12/2015.    Specialty:  Family Medicine   Why:  Wednesday at 11:00am for hospital follow-up   Contact information:   [address] [place] [address] [phone]       Follow up with wound care center  In 5 days.      Discharge Medications     Medication List    TAKE these medications        acetaminophen 325 MG tablet  Commonly known as:  TYLENOL  Take 650 mg by mouth every 6 (six) hours as needed.     amoxicillin-clavulanate  875-125 MG tablet  Commonly known as:  AUGMENTIN  Take 1 tablet by mouth 2 (two) times daily.     carvedilol 25 MG tablet  Commonly known as:  COREG  Take 25 mg by mouth 2 (two) times daily with a meal.     clotrimazole-betamethasone cream  Commonly known as:  LOTRISONE  Apply 1 application topically 2 (two) times daily.     docusate sodium 100 MG capsule  Commonly known as:  COLACE  Take 2 capsules (200 mg total) by mouth 2 (two) times daily.     feeding supplement Liqd  Take 1 Container by mouth daily.     furosemide 40 MG tablet  Commonly known as:  LASIX  Take 40 mg by mouth every morning.     gabapentin 300 MG capsule  Commonly known as:  NEURONTIN  Take 300 mg by mouth 2 (two) times daily. Reported on 08/22/2015     oxyCODONE-acetaminophen 5-325 MG tablet  Commonly known as:  PERCOCET  Take 1 tablet by mouth every 6 (six) hours as needed for severe pain.           Total Time in preparing paper work, data evaluation and todays exam - 35 minutes  [place] BilberryPATEL, Tinesha Siegrist M.D on 10/07/2015 at [facility]3:24 PM  [facility]   Office  [phone]

## 2015-10-08 LAB — CULTURE, BLOOD (ROUTINE X 2)
CULTURE: NO GROWTH
Culture: NO GROWTH

## 2015-10-10 ENCOUNTER — Encounter: Payer: Medicare Other | Attending: Surgery | Admitting: Surgery

## 2015-10-10 DIAGNOSIS — L97222 Non-pressure chronic ulcer of left calf with fat layer exposed: Secondary | ICD-10-CM | POA: Diagnosis not present

## 2015-10-10 DIAGNOSIS — Z86718 Personal history of other venous thrombosis and embolism: Secondary | ICD-10-CM | POA: Diagnosis not present

## 2015-10-10 DIAGNOSIS — L97821 Non-pressure chronic ulcer of other part of left lower leg limited to breakdown of skin: Secondary | ICD-10-CM | POA: Diagnosis not present

## 2015-10-10 DIAGNOSIS — I5022 Chronic systolic (congestive) heart failure: Secondary | ICD-10-CM | POA: Diagnosis not present

## 2015-10-10 DIAGNOSIS — L97212 Non-pressure chronic ulcer of right calf with fat layer exposed: Secondary | ICD-10-CM | POA: Diagnosis not present

## 2015-10-10 DIAGNOSIS — I890 Lymphedema, not elsewhere classified: Secondary | ICD-10-CM | POA: Insufficient documentation

## 2015-10-10 DIAGNOSIS — I872 Venous insufficiency (chronic) (peripheral): Secondary | ICD-10-CM | POA: Insufficient documentation

## 2015-10-10 DIAGNOSIS — I110 Hypertensive heart disease with heart failure: Secondary | ICD-10-CM | POA: Insufficient documentation

## 2015-10-10 DIAGNOSIS — E663 Overweight: Secondary | ICD-10-CM | POA: Insufficient documentation

## 2015-10-10 DIAGNOSIS — I87032 Postthrombotic syndrome with ulcer and inflammation of left lower extremity: Secondary | ICD-10-CM | POA: Diagnosis not present

## 2015-10-10 DIAGNOSIS — I2510 Atherosclerotic heart disease of native coronary artery without angina pectoris: Secondary | ICD-10-CM | POA: Diagnosis not present

## 2015-10-10 DIAGNOSIS — I87031 Postthrombotic syndrome with ulcer and inflammation of right lower extremity: Secondary | ICD-10-CM | POA: Insufficient documentation

## 2015-10-10 DIAGNOSIS — I4891 Unspecified atrial fibrillation: Secondary | ICD-10-CM | POA: Diagnosis not present

## 2015-10-10 DIAGNOSIS — L03116 Cellulitis of left lower limb: Secondary | ICD-10-CM | POA: Insufficient documentation

## 2015-10-10 DIAGNOSIS — Z87891 Personal history of nicotine dependence: Secondary | ICD-10-CM | POA: Diagnosis not present

## 2015-10-10 DIAGNOSIS — L03115 Cellulitis of right lower limb: Secondary | ICD-10-CM | POA: Diagnosis not present

## 2015-10-10 DIAGNOSIS — L97811 Non-pressure chronic ulcer of other part of right lower leg limited to breakdown of skin: Secondary | ICD-10-CM | POA: Diagnosis not present

## 2015-10-10 NOTE — Progress Notes (Signed)
ZORA, GLENDENNING ([id number]) Visit Report for 10/10/2015 Arrival Information Details Patient Name: LEALA, BRYAND. Date of Service: 10/10/2015 12:45 PM Medical Record Number: [id number] Patient Account Number: [id number] Date of Birth/Sex: [birth year] (75 y.o. Female) Treating RN: Afful, RN, BSN, [place] Sink Primary Care Physician: Elizabeth Sauer Other Clinician: Referring Physician: Elizabeth Sauer Treating Physician/Extender: Rudene Re in Treatment: 2 Visit Information History Since Last Visit Added or deleted any medications: No Patient Arrived: Dan Humphreys Any new allergies or adverse reactions: No Arrival Time: 12:51 Had a fall or experienced change in No Accompanied By: self activities of daily living that may affect Transfer Assistance: None risk of falls: Patient Identification Verified: Yes Signs or symptoms of abuse/neglect since last No Secondary Verification Process Yes visito Completed: Hospitalized since last visit: No Patient Has Alerts: Yes Has Dressing in Place as Prescribed: Yes Patient Alerts: ABI: (R)1.03 done Has Compression in Place as Prescribed: Yes at [facility] Pain Present Now: No ABI: (L)10.2 done at [facility] Electronic Signature(s) Signed: 10/10/2015 2:00:37 PM By: Elpidio Eric BSN, RN Entered By: Elpidio Eric on 10/10/2015 12:52:06 Niven, Levora Angel ([id number]) -------------------------------------------------------------------------------- Encounter Discharge Information Details Patient Name: [name] Date of Service: 10/10/2015 12:45 PM Medical Record Number: [id number] Patient Account Number: [id number] Date of Birth/Sex: 01/03/1941 (75 y.o. Female) Treating RN: Clover Mealy, RN, BSN, [place] Sink Primary Care Physician: Elizabeth Sauer Other Clinician: Referring Physician: Elizabeth Sauer Treating Physician/Extender: Rudene Re in Treatment: 2 Encounter Discharge Information Items Discharge Pain Level: 0 Discharge Condition: Stable Ambulatory Status:  Walker Discharge Destination: Home Transportation: Private Auto Accompanied By: self Schedule Follow-up Appointment: No Medication Reconciliation completed No and provided to Patient/Care Mehlani Blankenburg: Provided on Clinical Summary of Care: 10/10/2015 Form Type Recipient Paper Patient AS Electronic Signature(s) Signed: 10/10/2015 2:13:01 PM By: Elpidio Eric BSN, RN Previous Signature: 10/10/2015 1:33:29 PM Version By: Gwenlyn Perking Entered By: Elpidio Eric on 10/10/2015 14:09:33 Hesser, Levora Angel ([id number]) -------------------------------------------------------------------------------- Lower Extremity Assessment Details Patient Name: Kettles, Ardath L. Date of Service: 10/10/2015 12:45 PM Medical Record Number: [id number] Patient Account Number: [id number] Date of Birth/Sex: 1941-05-27 (75 y.o. Female) Treating RN: Afful, RN, BSN, Rita Primary Care Physician: Elizabeth Sauer Other Clinician: Referring Physician: Elizabeth Sauer Treating Physician/Extender: Rudene Re in Treatment: 2 Edema Assessment Assessed: Kyra Searles: No] [Right: No] Edema: [Left: Yes] [Right: Yes] Calf Left: Right: Point of Measurement: 34 cm From Medial Instep 47.8 cm 42 cm Ankle Left: Right: Point of Measurement: 11 cm From Medial Instep 37 cm 38.5 cm Vascular Assessment Pulses: Posterior Tibial Dorsalis Pedis Palpable: [Left:Yes] [Right:Yes] Extremity colors, hair growth, and conditions: Extremity Color: [Left:Hyperpigmented] [Right:Hyperpigmented] Hair Growth on Extremity: [Left:No] [Right:No] Temperature of Extremity: [Left:Warm] [Right:Warm] Capillary Refill: [Left:< 3 seconds] [Right:< 3 seconds] Toe Nail Assessment Left: Right: Thick: Yes Yes Discolored: Yes Yes Deformed: No No Improper Length and Hygiene: No No Electronic Signature(s) Signed: 10/10/2015 2:00:37 PM By: Elpidio Eric BSN, RN Entered By: Elpidio Eric on 10/10/2015 13:10:30 [place], Levora Angel  ([id number]) -------------------------------------------------------------------------------- Multi Wound Chart Details Patient Name: [name] Date of Service: 10/10/2015 12:45 PM Medical Record Number: [id number] Patient Account Number: [id number] Date of Birth/Sex: March 21, 1941 (75 y.o. Female) Treating RN: Clover Mealy, RN, BSN, [place] Sink Primary Care Physician: Elizabeth Sauer Other Clinician: Referring Physician: Elizabeth Sauer Treating Physician/Extender: Rudene Re in Treatment: 2 Vital Signs Height(in): Pulse(bpm): 86 Weight(lbs): Blood Pressure 125/72 (mmHg): Body Mass Index(BMI): Temperature(F): 97.6 Respiratory Rate 20 (breaths/min): Photos: [1:No Photos] [2:No Photos] [N/A:N/A] Wound Location: [1:Left Lower Leg - Circumfernential] [2:Right Lower Leg - Circumfernential] [  N/A:N/A] Wounding Event: [1:Gradually Appeared] [2:Gradually Appeared] [N/A:N/A] Primary Etiology: [1:Lymphedema] [2:Lymphedema] [N/A:N/A] Comorbid History: [1:Cataracts, Deep Vein Thrombosis, Hypertension, Peripheral Venous Disease] [2:Cataracts, Deep Vein Thrombosis, Hypertension, Peripheral Venous Disease] [N/A:N/A] Date Acquired: [1:09/07/2014] [2:09/07/2014] [N/A:N/A] Weeks of Treatment: [1:2] [2:2] [N/A:N/A] Wound Status: [1:Open] [2:Open] [N/A:N/A] Measurements L x W x D 22x45x0.1 [2:13x27x0.1] [N/A:N/A] (cm) Area (cm) : [1:610.960] [2:275.675] [N/A:N/A] Volume (cm) : [1:77.754] [2:27.567] [N/A:N/A] % Reduction in Area: [1:10.30%] [2:0.00%] [N/A:N/A] % Reduction in Volume: 10.30% [2:0.00%] [N/A:N/A] Classification: [1:Partial Thickness] [2:Partial Thickness] [N/A:N/A] Exudate Amount: [1:Large] [2:Large] [N/A:N/A] Exudate Type: [1:Serous] [2:Serous] [N/A:N/A] Exudate Color: [1:amber] [2:amber] [N/A:N/A] Wound Margin: [1:Flat and Intact] [2:Flat and Intact] [N/A:N/A] Granulation Amount: [1:Large (67-100%)] [2:Large (67-100%)] [N/A:N/A] Granulation Quality: [1:Pink] [2:Pink]  [N/A:N/A] Necrotic Amount: [1:None Present (0%)] [2:None Present (0%)] [N/A:N/A] Exposed Structures: [1:Fascia: No Fat: No Tendon: No Muscle: No] [2:Fascia: No Fat: No Tendon: No Muscle: No] [N/A:N/A] Joint: No Joint: No Bone: No Bone: No Limited to Skin Limited to Skin Breakdown Breakdown Epithelialization: None Large (67-100%) N/A Periwound Skin Texture: Edema: No Edema: No N/A Excoriation: No Excoriation: No Induration: No Induration: No Callus: No Callus: No Crepitus: No Crepitus: No Fluctuance: No Fluctuance: No Friable: No Friable: No Rash: No Rash: No Scarring: No Scarring: No Periwound Skin Maceration: Yes Moist: Yes N/A Moisture: Moist: Yes Dry/Scaly: Yes Dry/Scaly: No Maceration: No Periwound Skin Color: Atrophie Blanche: No Atrophie Blanche: No N/A Cyanosis: No Cyanosis: No Ecchymosis: No Ecchymosis: No Erythema: No Erythema: No Hemosiderin Staining: No Hemosiderin Staining: No Mottled: No Mottled: No Pallor: No Pallor: No Rubor: No Rubor: No Temperature: N/A No Abnormality N/A Tenderness on No No N/A Palpation: Wound Preparation: Ulcer Cleansing: Other: Ulcer Cleansing: Other: N/A soap and water soap and water Topical Anesthetic Topical Anesthetic Applied: None Applied: None Treatment Notes Electronic Signature(s) Signed: 10/10/2015 2:00:37 PM By: Elpidio Eric BSN, RN Entered By: Elpidio Eric on 10/10/2015 13:12:21 Barbar, Levora Angel ([id number]) -------------------------------------------------------------------------------- Multi-Disciplinary Care Plan Details Patient Name: [name] Date of Service: 10/10/2015 12:45 PM Medical Record Number: [id number] Patient Account Number: [id number] Date of Birth/Sex: Mar 13, 1941 (74 y.o. Female) Treating RN: Afful, RN, BSN,  Sink Primary Care Physician: Elizabeth Sauer Other Clinician: Referring Physician: Elizabeth Sauer Treating Physician/Extender: Rudene Re in Treatment: 2 Active  Inactive Orientation to the Wound Care Program Nursing Diagnoses: Knowledge deficit related to the wound healing center program Goals: Patient/caregiver will verbalize understanding of the Wound Healing Center Program Date Initiated: 09/26/2015 Goal Status: Active Interventions: Provide education on orientation to the wound center Notes: Venous Leg Ulcer Nursing Diagnoses: Actual venous Insuffiency (use after diagnosis is confirmed) Goals: Patient will maintain optimal edema control Date Initiated: 09/26/2015 Goal Status: Active Patient/caregiver will verbalize understanding of disease process and disease management Date Initiated: 09/26/2015 Goal Status: Active Interventions: Assess peripheral edema status every visit. Compression as ordered Provide education on venous insufficiency Notes: Wound/Skin Impairment Bangs, Kyrstan L. ([id number]) Nursing Diagnoses: Impaired tissue integrity Goals: Ulcer/skin breakdown will have a volume reduction of 30% by week 4 Date Initiated: 09/26/2015 Goal Status: Active Ulcer/skin breakdown will have a volume reduction of 50% by week 8 Date Initiated: 09/26/2015 Goal Status: Active Ulcer/skin breakdown will have a volume reduction of 80% by week 12 Date Initiated: 09/26/2015 Goal Status: Active Ulcer/skin breakdown will heal within 14 weeks Date Initiated: 09/26/2015 Goal Status: Active Interventions: Assess patient/caregiver ability to obtain necessary supplies Assess patient/caregiver ability to perform ulcer/skin care regimen upon admission and as needed Assess ulceration(s) every visit Provide education on ulcer and  skin care Notes: Electronic Signature(s) Signed: 10/10/2015 2:00:37 PM By: Elpidio Eric BSN, RN Entered By: Elpidio Eric on 10/10/2015 13:12:10 Bergey, Levora Angel ([id number]) -------------------------------------------------------------------------------- Pain Assessment Details Patient Name: Deguia, Willean L. Date of  Service: 10/10/2015 12:45 PM Medical Record Number: [id number] Patient Account Number: [id number] Date of Birth/Sex: April 24, 1941 (75 y.o. Female) Treating RN: Clover Mealy, RN, BSN, [place] Sink Primary Care Physician: Elizabeth Sauer Other Clinician: Referring Physician: Elizabeth Sauer Treating Physician/Extender: Rudene Re in Treatment: 2 Active Problems Location of Pain Severity and Description of Pain Patient Has Paino No Site Locations Pain Management and Medication Current Pain Management: Electronic Signature(s) Signed: 10/10/2015 2:00:37 PM By: Elpidio Eric BSN, RN Entered By: Elpidio Eric on 10/10/2015 12:52:20 Veal, Levora Angel ([id number]) -------------------------------------------------------------------------------- Patient/Caregiver Education Details Patient Name: [name] Date of Service: 10/10/2015 12:45 PM Medical Record Number: [id number] Patient Account Number: [id number] Date of Birth/Gender: Nov 15, 1940 (75 y.o. Female) Treating RN: Clover Mealy, RN, BSN, [place] Sink Primary Care Physician: Elizabeth Sauer Other Clinician: Referring Physician: Elizabeth Sauer Treating Physician/Extender: Rudene Re in Treatment: 2 Education Assessment Education Provided To: Patient Education Topics Provided Venous: Methods: Explain/Verbal Responses: State content correctly Welcome To The Wound Care Center: Methods: Explain/Verbal Responses: State content correctly Wound/Skin Impairment: Methods: Explain/Verbal Responses: State content correctly Electronic Signature(s) Signed: 10/10/2015 2:13:01 PM By: Elpidio Eric BSN, RN Entered By: Elpidio Eric on 10/10/2015 14:09:56 Kassing, Levora Angel ([id number]) -------------------------------------------------------------------------------- Wound Assessment Details Patient Name: Garofano, Carmesha L. Date of Service: 10/10/2015 12:45 PM Medical Record Number: [id number] Patient Account Number: [id number] Date of Birth/Sex: 14-Jul-1940 (75 y.o.  Female) Treating RN: Afful, RN, BSN,  Sink Primary Care Physician: Elizabeth Sauer Other Clinician: Referring Physician: Elizabeth Sauer Treating Physician/Extender: Rudene Re in Treatment: 2 Wound Status Wound Number: 1 Primary Lymphedema Etiology: Wound Location: Left Lower Leg - Circumfernential Wound Open Status: Wounding Event: Gradually Appeared Comorbid Cataracts, Deep Vein Thrombosis, Date Acquired: 09/07/2014 History: Hypertension, Peripheral Venous Weeks Of Treatment: 2 Disease Clustered Wound: No Photos Photo Uploaded By: Elpidio Eric on 10/10/2015 14:12:43 Wound Measurements Length: (cm) 22 Width: (cm) 45 Depth: (cm) 0.1 Area: (cm) 777.544 Volume: (cm) 77.754 % Reduction in Area: 10.3% % Reduction in Volume: 10.3% Epithelialization: None Tunneling: No Undermining: No Wound Description Classification: Partial Thickness Wound Margin: Flat and Intact Exudate Amount: Large Exudate Type: Serous Exudate Color: amber Foul Odor After Cleansing: No Wound Bed Granulation Amount: Large (67-100%) Exposed Structure Granulation Quality: Pink Fascia Exposed: No Necrotic Amount: None Present (0%) Fat Layer Exposed: No Cermak, Reyne L. ([id number]) Tendon Exposed: No Muscle Exposed: No Joint Exposed: No Bone Exposed: No Limited to Skin Breakdown Periwound Skin Texture Texture Color No Abnormalities Noted: No No Abnormalities Noted: No Callus: No Atrophie Blanche: No Crepitus: No Cyanosis: No Excoriation: No Ecchymosis: No Fluctuance: No Erythema: No Friable: No Hemosiderin Staining: No Induration: No Mottled: No Localized Edema: No Pallor: No Rash: No Rubor: No Scarring: No Moisture No Abnormalities Noted: No Dry / Scaly: No Maceration: Yes Moist: Yes Wound Preparation Ulcer Cleansing: Other: soap and water, Topical Anesthetic Applied: None Treatment Notes Wound #1 (Left, Circumferential Lower Leg) 1. Cleansed with: Cleanse wound  with antibacterial soap and water 3. Peri-wound Care: Moisturizing lotion 5. Secondary Dressing Applied ABD Pad 7. Secured with 4 Layer Compression System - Bilateral Electronic Signature(s) Signed: 10/10/2015 2:00:37 PM By: Elpidio Eric BSN, RN Entered By: Elpidio Eric on 10/10/2015 13:11:52 Mendibles, Levora Angel ([id number]) -------------------------------------------------------------------------------- Wound Assessment Details Patient Name: Vachon, Rosaura L. Date of Service: 10/10/2015 12:45 PM Medical Record Number:  [id number] Patient Account Number: [id number] Date of Birth/Sex: 29-Feb-1941 [age] y.o. Female) Treating RN: Afful, RN, BSN, Rita Primary Care Physician: Elizabeth SauerJones, Deanna Other Clinician: Referring Physician: Elizabeth SauerJones, Deanna Treating Physician/Extender: Rudene ReBritto, Errol Weeks in Treatment: 2 Wound Status Wound Number: 2 Primary Lymphedema Etiology: Wound Location: Right Lower Leg - Circumfernential Wound Open Status: Wounding Event: Gradually Appeared Comorbid Cataracts, Deep Vein Thrombosis, Date Acquired: 09/07/2014 History: Hypertension, Peripheral Venous Weeks Of Treatment: 2 Disease Clustered Wound: No Photos Photo Uploaded By: Elpidio EricAfful, Rita on 10/10/2015 14:12:43 Wound Measurements Length: (cm) 13 Width: (cm) 27 Depth: (cm) 0.1 Area: (cm) 275.675 Volume: (cm) 27.567 % Reduction in Area: 0% % Reduction in Volume: 0% Epithelialization: Large (67-100%) Tunneling: No Undermining: No Wound Description Classification: Partial Thickness Wound Margin: Flat and Intact Exudate Amount: Large Exudate Type: Serous Exudate Color: amber Foul Odor After Cleansing: No Wound Bed Granulation Amount: Large (67-100%) Exposed Structure Granulation Quality: Pink Fascia Exposed: No Necrotic Amount: None Present (0%) Fat Layer Exposed: No Ecklund, Tyreshia L. ([id number]) Tendon Exposed: No Muscle Exposed: No Joint Exposed: No Bone Exposed: No Limited to Skin  Breakdown Periwound Skin Texture Texture Color No Abnormalities Noted: No No Abnormalities Noted: No Callus: No Atrophie Blanche: No Crepitus: No Cyanosis: No Excoriation: No Ecchymosis: No Fluctuance: No Erythema: No Friable: No Hemosiderin Staining: No Induration: No Mottled: No Localized Edema: No Pallor: No Rash: No Rubor: No Scarring: No Temperature / Pain Moisture Temperature: No Abnormality No Abnormalities Noted: No Dry / Scaly: Yes Maceration: No Moist: Yes Wound Preparation Ulcer Cleansing: Other: soap and water, Topical Anesthetic Applied: None Treatment Notes Wound #2 (Right, Circumferential Lower Leg) 1. Cleansed with: Cleanse wound with antibacterial soap and water 3. Peri-wound Care: Moisturizing lotion 5. Secondary Dressing Applied ABD Pad 7. Secured with 4 Layer Compression System - Bilateral Electronic Signature(s) Signed: 10/10/2015 2:00:37 PM By: Elpidio EricAfful, Rita BSN, RN Entered By: Elpidio EricAfful, Rita on 10/10/2015 13:12:03 Picinich, Levora AngelANNIE L. ([id number]) -------------------------------------------------------------------------------- Vitals Details Patient Name: [name], Taylah L. Date of Service: 10/10/2015 12:45 PM Medical Record Number: [id number] Patient Account Number: [id number] Date of Birth/Sex: 29-Feb-1941 (75 y.o. Female) Treating RN: Afful, RN, BSN, [place] Sinkita Primary Care Physician: Elizabeth SauerJones, Deanna Other Clinician: Referring Physician: Elizabeth SauerJones, Deanna Treating Physician/Extender: Rudene ReBritto, Errol Weeks in Treatment: 2 Vital Signs Time Taken: 12:55 Temperature (F): 97.6 Pulse (bpm): 86 Respiratory Rate (breaths/min): 20 Blood Pressure (mmHg): 125/72 Reference Range: 80 - 120 mg / dl Electronic Signature(s) Signed: 10/10/2015 2:00:37 PM By: Elpidio EricAfful, Rita BSN, RN Entered By: Elpidio EricAfful, Rita on 10/10/2015 13:09:13

## 2015-10-10 NOTE — Progress Notes (Addendum)
[name] ([id number]) Visit Report for 10/10/2015 Chief Complaint Document Details Patient Name: Marie [name]. Date of Service: 10/10/2015 12:45 PM Medical Record Patient Account Number: [id number] Number: Afful, RN, BSN, Treating RN: 1940/11/19 (75 y.o. [place] Sink Date of Birth/Sex: Female) Other Clinician: Primary Care Physician: Elizabeth Sauer Treating Evlyn Kanner Referring Physician: Elizabeth Sauer Physician/Extender: Tania Ade in Treatment: 2 Information Obtained from: Patient Chief Complaint Patient presents for treatment of an open ulcer due to venous insufficiency which has been continuing this way for several months Electronic Signature(s) Signed: 10/10/2015 1:23:08 PM By: Evlyn Kanner MD, FACS Entered By: Evlyn Kanner on 10/10/2015 13:23:08 [name] ([id number]) -------------------------------------------------------------------------------- HPI Details Patient Name: [name]. Date of Service: 10/10/2015 12:45 PM Medical Record Patient Account Number: [id number] Number: Afful, RN, BSN, Treating RN: Feb 12, 1941 (75 y.o. [place] Sink Date of Birth/Sex: Female) Other Clinician: Primary Care Physician: Elizabeth Sauer Treating Evlyn Kanner Referring Physician: Elizabeth Sauer Physician/Extender: Tania Ade in Treatment: 2 History of Present Illness Location: bilateral lower extremity wounds Quality: Patient reports experiencing heaviness to affected area(s). Severity: Patient states wound are getting worse. Duration: Patient has had the wound for > 8 months prior to seeking treatment at the wound center Timing: Pain in wound is Intermittent (comes and goes Context: The wound would happen gradually Modifying Factors: Other treatment(s) tried include:Unna's boots and treatment by Dr. Gilda Crease at the vascular surgery office Associated Signs and Symptoms: Patient reports having increase discharge. HPI Description: 75 year old patient who is a poor  historian but most of the history is being obtained from Dr. Levora Dredge her vascular surgeon who has been treating her for about 8 months to a year. the patient has a postphlebitic syndrome both lower extremities and has been under his care for a while. The ABIs done in 2013 were normal and he does not suspect any vascular arterial compromise as the resting ABI on the right was 1.03 on the left was 1.02. An ultrasound of the lower extremity bilaterally did not reveal any DVT or superficial thrombophlebitis bilaterally. Patient's initial problem started when she had a hematoma for right lower extremity due to being on Coumadin and then developed a lot of swelling and has been having Unna's boots for a long while. However recently her left lower extremity started swelling and oozing profusely and the patient did not want to have Unna's boots applied and hence Dr. Gilda Crease has referred her to [place] for possible failure of 4 layer wraps with Profore. past medical history does not include diabetes mellitus but she has a cardiac issue and has had some element of CHF. She has been treated with lymphedema pumps in the past. 10/03/2015 -- the patient's lower extremities have been weeping very significantly and she has had a lot of swelling and redness since the last few days. She was seen by Dr. Gilda Crease this morning and he had recommended admission to the hospital but the patient was unwilling to have that done and came here for a second opinion. 10/10/2015 -- was admitted to the hospital March 27 to 10/07/2015 for bilateral cellulitis of lower extremities and also had chronic A. fib, chronic systolic heart failure, venous insufficiency of the legs and coronary artery disease. She was given 5 days of vancomycin and ampicillin and sulbactam. She was seen by Dr. Wyn Quaker and while she was inpatient and he recommended continuing with compression and oral antibiotics ( Augmentin ) at least for 2  weeks. Electronic Signature(s) Signed: 10/10/2015 1:23:19 PM By: Evlyn Kanner MD,  FACS Previous Signature: 10/10/2015 1:06:44 PM Version By: Evlyn Kanner MD, FACS [name], [name] ([id number]) Entered By: Evlyn Kanner on 10/10/2015 13:23:19 [name] ([id number]) -------------------------------------------------------------------------------- Physical Exam Details Patient Name: [name]. Date of Service: 10/10/2015 12:45 PM Medical Record Patient Account Number: [id number] Number: Afful, RN, BSN, Treating RN: 08-Feb-1941 (75 y.o. [place] Date of Birth/Sex: Female) Other Clinician: Primary Care Physician: Elizabeth Sauer Treating Evlyn Kanner Referring Physician: Elizabeth Sauer Physician/Extender: Tania Ade in Treatment: 2 Constitutional . Pulse regular. Respirations normal and unlabored. Afebrile. . Eyes Nonicteric. Reactive to light. Ears, Nose, Mouth, and Throat Lips, teeth, and gums WNL.Marland Kitchen Moist mucosa without lesions. Neck supple and nontender. No palpable supraclavicular or cervical adenopathy. Normal sized without goiter. Respiratory WNL. No retractions.. Cardiovascular Pedal Pulses WNL. No clubbing, cyanosis or edema. Chest Breasts symmetical and no nipple discharge.. Breast tissue WNL, no masses, lumps, or tenderness.. Lymphatic No adneopathy. No adenopathy. No adenopathy. Musculoskeletal Adexa without tenderness or enlargement.. Digits and nails w/o clubbing, cyanosis, infection, petechiae, ischemia, or inflammatory conditions.. Integumentary (Hair, Skin) No suspicious lesions. No crepitus or fluctuance. No peri-wound warmth or erythema. No masses.Marland Kitchen Psychiatric Judgement and insight Intact.. No evidence of depression, anxiety, or agitation.. Notes her erythema is much better and the cellulitis has gone down remarkably. The legs are dry and there is no open ulceration but she has some dry scaly skin. Electronic Signature(s) Signed: 10/10/2015 1:24:01  PM By: Evlyn Kanner MD, FACS Entered By: Evlyn Kanner on 10/10/2015 13:24:01 [name] ([id number]) -------------------------------------------------------------------------------- Physician Orders Details Patient Name: Marie Burner [name]. Date of Service: 10/10/2015 12:45 PM Medical Record Patient Account Number: [id number] Number: Afful, RN, BSN, Treating RN: [birth year] (75 y.o. [place] Sink Date of Birth/Sex: Female) Other Clinician: Primary Care Physician: Elizabeth Sauer Treating Evlyn Kanner Referring Physician: Elizabeth Sauer Physician/Extender: Tania Ade in Treatment: 2 Verbal / Phone Orders: Yes Clinician: Afful, RN, BSN, Rita Read Back and Verified: Yes Diagnosis Coding Wound Cleansing Wound #1 Left,Circumferential Lower Leg o Cleanse wound with mild soap and water o May shower with protection. o No tub bath. Wound #2 Right,Circumferential Lower Leg o Cleanse wound with mild soap and water o May shower with protection. o No tub bath. Skin Barriers/Peri-Wound Care Wound #1 Left,Circumferential Lower Leg o Moisturizing lotion Wound #2 Right,Circumferential Lower Leg o Moisturizing lotion Primary Wound Dressing Wound #1 Left,Circumferential Lower Leg o ABD Pad Wound #2 Right,Circumferential Lower Leg o ABD Pad Dressing Change Frequency Wound #1 Left,Circumferential Lower Leg o Change Dressing Monday, Wednesday, Friday Wound #2 Right,Circumferential Lower Leg o Change Dressing Monday, Wednesday, Friday Follow-up Appointments Wound #1 Left,Circumferential Lower Leg [name]. ([id number]) o Return Appointment in 1 week. Wound #2 Right,Circumferential Lower Leg o Return Appointment in 1 week. Edema Control Wound #1 Left,Circumferential Lower Leg o 4 Layer Compression System - Bilateral Wound #2 Right,Circumferential Lower Leg o 4 Layer Compression System - Bilateral Additional Orders / Instructions Wound #1  Left,Circumferential Lower Leg o Increase protein intake. Wound #2 Right,Circumferential Lower Leg o Increase protein intake. Home Health Wound #1 Left,Circumferential Lower Leg o Continue Home Health Visits o Home Health Nurse may visit PRN to address patientos wound care needs. o FACE TO FACE ENCOUNTER: MEDICARE and MEDICAID PATIENTS: I certify that this patient is under my care and that I had a face-to-face encounter that meets the physician face-to-face encounter requirements with this patient on this date. The encounter with the patient was in whole or in part for the following MEDICAL CONDITION: (primary reason for  Home Healthcare) MEDICAL NECESSITY: I certify, that based on my findings, NURSING services are a medically necessary home health service. HOME BOUND STATUS: I certify that my clinical findings support that this patient is homebound (i.e., Due to illness or injury, pt requires aid of supportive devices such as crutches, cane, wheelchairs, walkers, the use of special transportation or the assistance of another person to leave their place of residence. There is a normal inability to leave the home and doing so requires considerable and taxing effort. Other absences are for medical reasons / religious services and are infrequent or of short duration when for other reasons). o If current dressing causes regression in wound condition, may D/C ordered dressing product/s and apply Normal Saline Moist Dressing daily until next Wound Healing Center / Other MD appointment. Notify Wound Healing Center of regression in wound condition at [phone]. o Please direct any NON-WOUND related issues/requests for orders to patient's Primary Care Physician Wound #2 Right,Circumferential Lower Leg o Continue Home Health Visits o Home Health Nurse may visit PRN to address patientos wound care needs. o FACE TO FACE ENCOUNTER: MEDICARE and MEDICAID PATIENTS: I certify that  this patient is under my care and that I had a face-to-face encounter that meets the physician face-to-face encounter requirements with this patient on this date. The encounter with the patient was in whole or in part for the following MEDICAL CONDITION: (primary reason for Home Healthcare) [name], Alizandra [name]. ([id number]) MEDICAL NECESSITY: I certify, that based on my findings, NURSING services are a medically necessary home health service. HOME BOUND STATUS: I certify that my clinical findings support that this patient is homebound (i.e., Due to illness or injury, pt requires aid of supportive devices such as crutches, cane, wheelchairs, walkers, the use of special transportation or the assistance of another person to leave their place of residence. There is a normal inability to leave the home and doing so requires considerable and taxing effort. Other absences are for medical reasons / religious services and are infrequent or of short duration when for other reasons). o If current dressing causes regression in wound condition, may D/C ordered dressing product/s and apply Normal Saline Moist Dressing daily until next Wound Healing Center / Other MD appointment. Notify Wound Healing Center of regression in wound condition at [phone]. o Please direct any NON-WOUND related issues/requests for orders to patient's Primary Care Physician Electronic Signature(s) Signed: 10/10/2015 2:00:37 PM By: Elpidio EricAfful, Rita BSN, RN Signed: 10/10/2015 3:32:20 PM By: Evlyn KannerBritto, Kyani Simkin MD, FACS Entered By: Elpidio EricAfful, Rita on 10/10/2015 13:22:26 Ayyad, Margaux Elbert EwingsL. ([id number]) -------------------------------------------------------------------------------- Problem List Details Patient Name: Demeo, Detria [name]. Date of Service: 10/10/2015 12:45 PM Medical Record Patient Account Number: [id number] Number: Afful, RN, BSN, Treating RN: 01-20-1941 (75 y.o. 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 ([id number]) Entered By: Evlyn KannerBritto, Sohail Capraro  on 10/10/2015 13:23:02 Marie [name] ([id number]) -------------------------------------------------------------------------------- Progress Note Details Patient Name: Apel, Sharniece [name]. Date of Service: 10/10/2015 12:45 PM Medical Record Patient Account Number: [id number] Number: Afful, RN, BSN, Treating RN: 29-Mar-1941 (75 y.o. [place] S[place] Date of Birth/Sex: Female) Other Clinician: Primary Care Physician: Elizabeth Sauer Treating Evlyn Kanner Referring Physician: Elizabeth Sauer Physician/Extender: Tania Ade in Treatment: 2 Subjective Chief Complaint Information obtained from Patient Patient presents for treatment of an open ulcer due to venous insufficiency which has been continuing this way for several months History of Present Illness (HPI) The following HPI elements were documented for the patient's wound: Location: bilateral lower extremity wounds Quality: Patient reports experiencing heaviness to affected area(s). Severity: Patient states wound are getting worse. Duration: Patient has had the wound  for > 8 months prior to seeking treatment at the wound center Timing: Pain in wound is Intermittent (comes and goes Context: The wound would happen gradually Modifying Factors: Other treatment(s) tried include:Unna's boots and treatment by Dr. Gilda Crease at the vascular surgery office Associated Signs and Symptoms: Patient reports having increase discharge. 75 year old patient who is a poor historian but most of the history is being obtained from Dr. Levora Dredge her vascular surgeon who has been treating her for about 8 months to a year. the patient has a postphlebitic syndrome both lower extremities and has been under his care for a while. The ABIs done in 2013 were normal and he does not suspect any vascular arterial compromise as the resting ABI on the right was 1.03 on the left was 1.02. An ultrasound of the lower extremity bilaterally did not reveal any DVT or superficial thrombophlebitis bilaterally. Patient's initial problem started when she had a hematoma for right lower extremity due to being on Coumadin and then developed a lot of swelling and has been having Unna's boots for a long while. However recently her left lower extremity started swelling and oozing profusely and the patient did not want to have Unna's boots applied and hence Dr. Gilda Crease has referred her to [place] for possible failure of 4 layer wraps with Profore. past medical history does not include diabetes mellitus but she has a cardiac issue and has had some element of CHF. She has been treated with lymphedema pumps in the past. 10/03/2015 -- the patient's lower extremities have been weeping very significantly and she has had a lot of swelling and redness since the last few days. She was seen by Dr. Gilda Crease this morning and he had recommended admission to the hospital but the patient was unwilling to have that done and came here for a second opinion. Marie [name] ([id number]) 10/10/2015 -- was admitted to the  hospital March 27 to 10/07/2015 for bilateral cellulitis of lower extremities and also had chronic A. fib, chronic systolic heart failure, venous insufficiency of the legs and coronary artery disease. She was given 5 days of vancomycin and ampicillin and sulbactam. She was seen by Dr. Wyn Quaker and while she was inpatient and he recommended continuing with compression and oral antibiotics ( Augmentin ) at least for 2 weeks. Objective Constitutional Pulse regular. Respirations normal and unlabored. Afebrile. Vitals Time Taken: 12:55 PM, Temperature: 97.6 F, Pulse: 86 bpm, Respiratory Rate: 20 breaths/min, Blood Pressure: 125/72 mmHg. Eyes Nonicteric. Reactive to light. Ears, Nose, Mouth, and Throat Lips, teeth, and gums WNL.Marland Kitchen Moist mucosa without lesions. Neck supple and nontender. No palpable supraclavicular or cervical adenopathy. Normal sized without goiter. Respiratory WNL. No retractions.. Cardiovascular Pedal Pulses WNL. No clubbing, cyanosis or  edema. Chest Breasts symmetical and no nipple discharge.. Breast tissue WNL, no masses, lumps, or tenderness.. Lymphatic No adneopathy. No adenopathy. No adenopathy. Musculoskeletal Adexa without tenderness or enlargement.. Digits and nails w/o clubbing, cyanosis, infection, petechiae, ischemia, or inflammatory conditions.Marland Kitchen Psychiatric Judgement and insight Intact.. No evidence of depression, anxiety, or agitation.. General Notes: her erythema is much better and the cellulitis has gone down remarkably. The legs are dry [place], [name]. ([id number]) and there is no open ulceration but she has some dry scaly skin. Integumentary (Hair, Skin) No suspicious lesions. No crepitus or fluctuance. No peri-wound warmth or erythema. No masses.. Wound #1 status is Open. Original cause of wound was Gradually Appeared. The wound is located on the Left,Circumferential Lower Leg. The wound measures 22cm length x 45cm width x 0.1cm depth; 777.544cm^2 area  and 77.754cm^3 volume. The wound is limited to skin breakdown. There is no tunneling or undermining noted. There is a large amount of serous drainage noted. The wound margin is flat and intact. There is large (67-100%) pink granulation within the wound bed. There is no necrotic tissue within the wound bed. The periwound skin appearance exhibited: Maceration, Moist. The periwound skin appearance did not exhibit: Callus, Crepitus, Excoriation, Fluctuance, Friable, Induration, Localized Edema, Rash, Scarring, Dry/Scaly, Atrophie Blanche, Cyanosis, Ecchymosis, Hemosiderin Staining, Mottled, Pallor, Rubor, Erythema. Wound #2 status is Open. Original cause of wound was Gradually Appeared. The wound is located on the Right,Circumferential Lower Leg. The wound measures 13cm length x 27cm width x 0.1cm depth; 275.675cm^2 area and 27.567cm^3 volume. The wound is limited to skin breakdown. There is no tunneling or undermining noted. There is a large amount of serous drainage noted. The wound margin is flat and intact. There is large (67-100%) pink granulation within the wound bed. There is no necrotic tissue within the wound bed. The periwound skin appearance exhibited: Dry/Scaly, Moist. The periwound skin appearance did not exhibit: Callus, Crepitus, Excoriation, Fluctuance, Friable, Induration, Localized Edema, Rash, Scarring, Maceration, Atrophie Blanche, Cyanosis, Ecchymosis, Hemosiderin Staining, Mottled, Pallor, Rubor, Erythema. Periwound temperature was noted as No Abnormality. 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 We will continue to use a 4-layer Profore wrap and this will be changed Wednesday and Friday by home health. If she looks good neck suite we can possibly put her in a compression stockings and continue with elevation and exercise. Marie [name] ([id number]) Plan Wound Cleansing: Wound #1 Left,Circumferential Lower Leg: Cleanse wound with mild soap and water May shower with protection. No tub bath. Wound #2 Right,Circumferential Lower Leg: Cleanse wound with mild soap and water May shower with protection. No tub bath. Skin Barriers/Peri-Wound Care: Wound #1 Left,Circumferential Lower Leg: Moisturizing lotion Wound #2 Right,Circumferential Lower Leg: Moisturizing lotion Primary Wound Dressing: Wound #1 Left,Circumferential Lower Leg: ABD Pad Wound #2 Right,Circumferential Lower Leg: ABD Pad Dressing Change Frequency: Wound #1 Left,Circumferential Lower Leg: Change Dressing Monday, Wednesday, Friday Wound #2 Right,Circumferential Lower Leg: Change Dressing Monday, Wednesday, Friday Follow-up Appointments: Wound #1 Left,Circumferential Lower Leg: Return Appointment in 1 week. Wound #2 Right,Circumferential  Lower Leg: Return Appointment in 1 week. Edema Control: Wound #1 Left,Circumferential Lower Leg: 4 Layer Compression System - Bilateral Wound #2 Right,Circumferential Lower Leg: 4 Layer Compression System - Bilateral Additional Orders / Instructions: Wound #1 Left,Circumferential Lower Leg: Increase protein intake. Wound #2 Right,Circumferential Lower Leg: Increase protein intake. Home Health: Wound #1 Left,Circumferential Lower Leg: Continue Home Health Visits Home Health Nurse may visit PRN to address patient s wound care needs. FACE TO FACE ENCOUNTER: MEDICARE and MEDICAID PATIENTS: I certify that this patient is under my  care and that I had a face-to-face encounter that meets the physician face-to-face encounter requirements with this patient on this date. The encounter with the patient was in whole or in part for the following MEDICAL CONDITION: (primary reason for Home Healthcare) MEDICAL NECESSITY: I certify, Marie [name]. ([id number]) that based on my findings, NURSING services are a medically necessary home health service. HOME BOUND STATUS: I certify that my clinical findings support that this patient is homebound (i.e., Due to illness or injury, pt requires aid of supportive devices such as crutches, cane, wheelchairs, walkers, the use of special transportation or the assistance of another person to leave their place of residence. There is a normal inability to leave the home and doing so requires considerable and taxing effort. Other absences are for medical reasons / religious services and are infrequent or of short duration when for other reasons). If current dressing causes regression in wound condition, may D/C ordered dressing product/s and apply Normal Saline Moist Dressing daily until next Wound Healing Center / Other MD appointment. Notify Wound Healing Center of regression in wound condition at [phone]. Please direct any NON-WOUND related issues/requests for orders to patient's Primary Care Physician Wound #2 Right,Circumferential Lower Leg: Continue Home Health Visits Home Health Nurse may visit PRN to address patient s wound care needs. FACE TO FACE ENCOUNTER: MEDICARE and MEDICAID PATIENTS: I certify that this patient is under my care and that I had a face-to-face encounter that meets the physician face-to-face encounter requirements with this patient on this date. The encounter with the patient was in whole or in part for the following MEDICAL CONDITION: (primary reason for Home Healthcare) MEDICAL NECESSITY: I certify, that based on my findings, NURSING services are a medically  necessary home health service. HOME BOUND STATUS: I certify that my clinical findings support that this patient is homebound (i.e., Due to illness or injury, pt requires aid of supportive devices such as crutches, cane, wheelchairs, walkers, the use of special transportation or the assistance of another person to leave their place of residence. There is a normal inability to leave the home and doing so requires considerable and taxing effort. Other absences are for medical reasons / religious services and are infrequent or of short duration when for other reasons). If current dressing causes regression in wound condition, may D/C ordered dressing product/s and apply Normal Saline Moist Dressing daily until next [facility]aling Center / Other MD appointment. Notify W[facility]aling Center of regression in wound condition at [phone]. Please direct any NON-WOUND related issues/requests for orders to patient's Primary Care Physician Her recent hospitalization and inpatient treatment has been reviewed by me and this has helped her immensely. We will continue to use a 4-layer Profore wrap and this will be changed Wednesday and Friday by home health. If she looks good neck suite we can possibly put her in a compression stockings and continue with elevation and exercise. Electronic Signature(s) Signed: 10/10/2015 1:26:23 PM By:  Evlyn Kanner MD, FACS Previous Signature: 10/10/2015 1:25:31 PM Version By: Evlyn Kanner MD, FACS Entered By: Evlyn Kanner on 10/10/2015 13:26:23 [name] ([id number]) -------------------------------------------------------------------------------- SuperBill Details Patient Name: Schlup, Toy [name]. Date of Service: 10/10/2015 Medical Record Patient Account Number: [id number] Number: Afful, RN, BSN, Treating RN: Jan 26, 1941 (75 y.o. 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 E66.3 Overweight L03.116 Cellulitis of left lower limb L03.115 Cellulitis of right lower limb Physician Procedures CPT4: Description Modifier Quantity Code 4098119 [id number] - WC PHYS LEVEL 3 - EST PT 1 ICD-10 Description Diagnosis 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

## 2015-10-12 ENCOUNTER — Inpatient Hospital Stay: Payer: TRICARE For Life (TFL) | Admitting: Family Medicine

## 2015-10-12 DIAGNOSIS — I340 Nonrheumatic mitral (valve) insufficiency: Secondary | ICD-10-CM | POA: Diagnosis not present

## 2015-10-12 DIAGNOSIS — I4891 Unspecified atrial fibrillation: Secondary | ICD-10-CM | POA: Diagnosis not present

## 2015-10-12 DIAGNOSIS — D509 Iron deficiency anemia, unspecified: Secondary | ICD-10-CM | POA: Diagnosis not present

## 2015-10-12 DIAGNOSIS — S8011XD Contusion of right lower leg, subsequent encounter: Secondary | ICD-10-CM | POA: Diagnosis not present

## 2015-10-12 DIAGNOSIS — I110 Hypertensive heart disease with heart failure: Secondary | ICD-10-CM | POA: Diagnosis not present

## 2015-10-12 DIAGNOSIS — I5022 Chronic systolic (congestive) heart failure: Secondary | ICD-10-CM | POA: Diagnosis not present

## 2015-10-12 DIAGNOSIS — Z87891 Personal history of nicotine dependence: Secondary | ICD-10-CM | POA: Diagnosis not present

## 2015-10-12 DIAGNOSIS — I872 Venous insufficiency (chronic) (peripheral): Secondary | ICD-10-CM | POA: Diagnosis not present

## 2015-10-12 DIAGNOSIS — I272 Other secondary pulmonary hypertension: Secondary | ICD-10-CM | POA: Diagnosis not present

## 2015-10-12 DIAGNOSIS — Z9181 History of falling: Secondary | ICD-10-CM | POA: Diagnosis not present

## 2015-10-14 DIAGNOSIS — Z87891 Personal history of nicotine dependence: Secondary | ICD-10-CM | POA: Diagnosis not present

## 2015-10-14 DIAGNOSIS — Z9181 History of falling: Secondary | ICD-10-CM | POA: Diagnosis not present

## 2015-10-14 DIAGNOSIS — D509 Iron deficiency anemia, unspecified: Secondary | ICD-10-CM | POA: Diagnosis not present

## 2015-10-14 DIAGNOSIS — I872 Venous insufficiency (chronic) (peripheral): Secondary | ICD-10-CM | POA: Diagnosis not present

## 2015-10-14 DIAGNOSIS — I340 Nonrheumatic mitral (valve) insufficiency: Secondary | ICD-10-CM | POA: Diagnosis not present

## 2015-10-14 DIAGNOSIS — I272 Other secondary pulmonary hypertension: Secondary | ICD-10-CM | POA: Diagnosis not present

## 2015-10-14 DIAGNOSIS — I110 Hypertensive heart disease with heart failure: Secondary | ICD-10-CM | POA: Diagnosis not present

## 2015-10-14 DIAGNOSIS — S8011XD Contusion of right lower leg, subsequent encounter: Secondary | ICD-10-CM | POA: Diagnosis not present

## 2015-10-14 DIAGNOSIS — I5022 Chronic systolic (congestive) heart failure: Secondary | ICD-10-CM | POA: Diagnosis not present

## 2015-10-14 DIAGNOSIS — I4891 Unspecified atrial fibrillation: Secondary | ICD-10-CM | POA: Diagnosis not present

## 2015-10-17 ENCOUNTER — Encounter: Payer: Medicare Other | Admitting: Surgery

## 2015-10-17 DIAGNOSIS — L97811 Non-pressure chronic ulcer of other part of right lower leg limited to breakdown of skin: Secondary | ICD-10-CM | POA: Diagnosis not present

## 2015-10-17 DIAGNOSIS — I5022 Chronic systolic (congestive) heart failure: Secondary | ICD-10-CM | POA: Diagnosis not present

## 2015-10-17 DIAGNOSIS — L03116 Cellulitis of left lower limb: Secondary | ICD-10-CM | POA: Diagnosis not present

## 2015-10-17 DIAGNOSIS — I87031 Postthrombotic syndrome with ulcer and inflammation of right lower extremity: Secondary | ICD-10-CM | POA: Diagnosis not present

## 2015-10-17 DIAGNOSIS — I4891 Unspecified atrial fibrillation: Secondary | ICD-10-CM | POA: Diagnosis not present

## 2015-10-17 DIAGNOSIS — I110 Hypertensive heart disease with heart failure: Secondary | ICD-10-CM | POA: Diagnosis not present

## 2015-10-17 DIAGNOSIS — I87032 Postthrombotic syndrome with ulcer and inflammation of left lower extremity: Secondary | ICD-10-CM | POA: Diagnosis not present

## 2015-10-17 DIAGNOSIS — I890 Lymphedema, not elsewhere classified: Secondary | ICD-10-CM | POA: Diagnosis not present

## 2015-10-17 DIAGNOSIS — L03115 Cellulitis of right lower limb: Secondary | ICD-10-CM | POA: Diagnosis not present

## 2015-10-17 DIAGNOSIS — L97222 Non-pressure chronic ulcer of left calf with fat layer exposed: Secondary | ICD-10-CM | POA: Diagnosis not present

## 2015-10-17 DIAGNOSIS — I2510 Atherosclerotic heart disease of native coronary artery without angina pectoris: Secondary | ICD-10-CM | POA: Diagnosis not present

## 2015-10-17 DIAGNOSIS — I872 Venous insufficiency (chronic) (peripheral): Secondary | ICD-10-CM | POA: Diagnosis not present

## 2015-10-17 DIAGNOSIS — L97212 Non-pressure chronic ulcer of right calf with fat layer exposed: Secondary | ICD-10-CM | POA: Diagnosis not present

## 2015-10-17 DIAGNOSIS — Z87891 Personal history of nicotine dependence: Secondary | ICD-10-CM | POA: Diagnosis not present

## 2015-10-17 DIAGNOSIS — L97821 Non-pressure chronic ulcer of other part of left lower leg limited to breakdown of skin: Secondary | ICD-10-CM | POA: Diagnosis not present

## 2015-10-17 DIAGNOSIS — Z86718 Personal history of other venous thrombosis and embolism: Secondary | ICD-10-CM | POA: Diagnosis not present

## 2015-10-17 NOTE — Progress Notes (Signed)
GRACIEANN, STANNARD ([id number]) Visit Report for 10/17/2015 Arrival Information Details Patient Name: [name], [name] 10/17/2015 12:45 Date of Service: PM Medical Record [id number] Number: Patient Account Number: [id number] [birth year] (75 y.o. Treating RN: Leonard Downing Date of Birth/Sex: Female) Other Clinician: Primary Care Physician: Elizabeth Sauer Treating Britto, Errol Referring Physician: Elizabeth Sauer Physician/Extender: Tania Ade in Treatment: 3 Visit Information History Since Last Visit All ordered tests and consults were completed: No Patient Arrived: Dan Humphreys Added or deleted any medications: No Arrival Time: 12:45 Any new allergies or adverse reactions: No Accompanied By: none Had a fall or experienced change in No Transfer Assistance: None activities of daily living that may affect Patient Has Alerts: Yes risk of falls: Patient Alerts: ABI: (R)1.03 done at [facility] Signs or symptoms of abuse/neglect since last No ABI: (L)10.2 done at [facility] visito Hospitalized since last visit: No Has Compression in Place as Prescribed: Yes Pain Present Now: No Electronic Signature(s) Signed: 10/17/2015 4:17:15 PM By: Lucrezia Starch, RN, Sendra Entered By: Lucrezia Starch RN, Sendra on 10/17/2015 13:06:30 Wynns, Levora Angel ([id number]) -------------------------------------------------------------------------------- Encounter Discharge Information Details Patient Name: [name], [name] 10/17/2015 12:45 Date of Service: PM Medical Record [id number] Number: Patient Account Number: [id number] Mar 29, 1941 (74 y.o. Treating RN: Leonard Downing Date of Birth/Sex: Female) Other Clinician: Primary Care Physician: Elizabeth Sauer Treating Evlyn Kanner Referring Physician: Elizabeth Sauer Physician/Extender: Tania Ade in Treatment: 3 Encounter Discharge Information Items Facility Notification Discharge Pain Level: 0 Facility Type: Home Health Discharge Condition: Stable Orders Sent: Yes Ambulatory Status:  Walker Discharge Destination: Home Transportation: Private Auto Accompanied By: self Schedule Follow-up Appointment: Yes Medication Reconciliation completed and provided to Patient/Care Yes Shametra Cumberland: Provided on Clinical Summary of Care: 10/17/2015 Form Type Recipient Paper Patient AS Electronic Signature(s) Signed: 10/17/2015 1:39:35 PM By: Gwenlyn Perking Entered By: Gwenlyn Perking on 10/17/2015 13:39:35 Shanahan, Levora Angel ([id number]) -------------------------------------------------------------------------------- Lower Extremity Assessment Details Patient Name: [name], [name] 10/17/2015 12:45 Date of Service: PM Medical Record [id number] Number: Patient Account Number: [id number] 1941-06-30 (74 y.o. Treating RN: Leonard Downing Date of Birth/Sex: Female) Other Clinician: Primary Care Physician: Elizabeth Sauer Treating Britto, Errol Referring Physician: Elizabeth Sauer Physician/Extender: Tania Ade in Treatment: 3 Edema Assessment Assessed: [Left: No] [Right: No] E[Left: dema] [Right: :] Calf Left: Right: Point of Measurement: 34 cm From Medial Instep 40.2 cm 36.5 cm Ankle Left: Right: Point of Measurement: 11 cm From Medial Instep 34 cm 35 cm Vascular Assessment Pulses: Posterior Tibial Dorsalis Pedis Palpable: [Left:Yes] [Right:Yes] Extremity colors, hair growth, and conditions: Extremity Color: [Left:Mottled] [Right:Mottled] Hair Growth on Extremity: [Left:No] [Right:No] Temperature of Extremity: [Left:Warm] [Right:Warm] Capillary Refill: [Left:< 3 seconds] [Right:< 3 seconds] Toe Nail Assessment Left: Right: Thick: No No Discolored: No No Deformed: No No Improper Length and Hygiene: No No Electronic Signature(s) Signed: 10/17/2015 4:17:15 PM By: Lucrezia Starch, RN, Sendra Entered By: Lucrezia Starch RN, Sendra on 10/17/2015 13:09:16 Kalka, Levora Angel ([id number]) LILLA, CALLEJO  ([id number]) -------------------------------------------------------------------------------- Pain Assessment Details Patient Name: [name], [name] 10/17/2015 12:45 Date of Service: PM Medical Record [id number] Number: Patient Account Number: [id number] Jul 12, 1940 (74 y.o. Treating RN: Leonard Downing Date of Birth/Sex: Female) Other Clinician: Primary Care Physician: Elizabeth Sauer Treating Evlyn Kanner Referring Physician: Elizabeth Sauer Physician/Extender: Tania Ade in Treatment: 3 Active Problems Location of Pain Severity and Description of Pain Patient Has Paino No Site Locations Rate the pain. Current Pain Level: 0 Pain Management and Medication Current Pain Management: Electronic Signature(s) Signed: 10/17/2015 4:17:15 PM By: Lucrezia Starch, RN, Sendra Entered By: Lucrezia Starch RN, Sendra on 10/17/2015 13:06:39 Buehrle, Levora Angel ([id number]) --------------------------------------------------------------------------------  Patient/Caregiver Education Details Patient Name: [name], [name] 10/17/2015 12:45 Date of Service: PM Medical Record [id number] Number: Patient Account Number: [id number] 11-05-1940 (75 y.o. Treating RN: Leonard Downingoseboro, Sendra Date of Birth/Gender: Female) Other Clinician: Primary Care Physician: Elizabeth SauerJones, Deanna Treating Evlyn KannerBritto, Errol Referring Physician: Elizabeth SauerJones, Deanna Physician/Extender: Tania AdeWeeks in Treatment: 3 Education Assessment Education Provided To: Patient Education Topics Provided Venous: Handouts: Controlling Swelling with Multilayered Compression Wraps Methods: Explain/Verbal Responses: State content correctly Wound/Skin Impairment: Handouts: Caring for Your Ulcer, Skin Care Do's and Dont's Methods: Explain/Verbal Responses: State content correctly Electronic Signature(s) Signed: 10/17/2015 4:17:15 PM By: Lucrezia Starchoseboro, RN, Sendra Entered By: Lucrezia Starchoseboro, RN, Sendra on 10/17/2015 13:12:29 Nack, Levora AngelANNIE L.  ([id number]) -------------------------------------------------------------------------------- Wound Assessment Details Patient Name: [name], [name] 10/17/2015 12:45 Date of Service: PM Medical Record [id number] Number: Patient Account Number: [id number] 11-05-1940 (74 y.o. Treating RN: Leonard Downingoseboro, Sendra Date of Birth/Sex: Female) Other Clinician: Primary Care Physician: Elizabeth SauerJones, Deanna Treating Britto, Errol Referring Physician: Elizabeth SauerJones, Deanna Physician/Extender: Tania AdeWeeks in Treatment: 3 Wound Status Wound Number: 1 Primary Etiology: Lymphedema Wound Location: Left, Circumferential Lower Leg Wound Status: Open Wounding Event: Gradually Appeared Date Acquired: 09/07/2014 Weeks Of Treatment: 3 Clustered Wound: No Photos Photo Uploaded By: Lucrezia Starchoseboro, RN, Rosalio MacadamiaSendra on 10/17/2015 15:35:14 Wound Measurements Length: (cm) 9 % Reduction in Width: (cm) 11 % Reduction in Depth: (cm) 0.1 Area: (cm) 77.754 Volume: (cm) 7.775 Area: 91% Volume: 91% Wound Description Classification: Partial Thickness Periwound Skin Texture Texture Color No Abnormalities Noted: No No Abnormalities Noted: No Moisture No Abnormalities Noted: No Treatment Notes Chilton, Labresha L. ([id number]) Wound #1 (Left, Circumferential Lower Leg) 1. Cleansed with: Cleanse wound with antibacterial soap and water 3. Peri-wound Care: Moisturizing lotion 5. Secondary Dressing Applied ABD Pad 7. Secured with 4 Layer Compression System - Bilateral Electronic Signature(s) Signed: 10/17/2015 4:17:15 PM By: Lucrezia Starchoseboro, RN, Sendra Entered By: Lucrezia Starchoseboro, RN, Sendra on 10/17/2015 13:10:10 Rosenow, Levora AngelANNIE L. ([id number]) -------------------------------------------------------------------------------- Wound Assessment Details Patient Name: [name], [name] 10/17/2015 12:45 Date of Service: PM Medical Record [id number] Number: Patient Account Number: [id number] 11-05-1940 (74 y.o. Treating RN: Leonard Downingoseboro, Sendra Date of  Birth/Sex: Female) Other Clinician: Primary Care Physician: Elizabeth SauerJones, Deanna Treating Britto, Errol Referring Physician: Elizabeth SauerJones, Deanna Physician/Extender: Tania AdeWeeks in Treatment: 3 Wound Status Wound Number: 2 Primary Etiology: Lymphedema Wound Location: Right, Circumferential Lower Wound Status: Open Leg Wounding Event: Gradually Appeared Date Acquired: 09/07/2014 Weeks Of Treatment: 3 Clustered Wound: No Photos Photo Uploaded By: Lucrezia Starchoseboro, RN, Rosalio MacadamiaSendra on 10/17/2015 15:35:38 Wound Measurements Length: (cm) 9 Width: (cm) 22 Depth: (cm) 0.1 Area: (cm) 155.509 Volume: (cm) 15.551 % Reduction in Area: 43.6% % Reduction in Volume: 43.6% Wound Description Classification: Partial Thickness Periwound Skin Texture Texture Color No Abnormalities Noted: No No Abnormalities Noted: No Moisture No Abnormalities Noted: No Carneiro, Mayanna L. ([id number]) Treatment Notes Wound #2 (Right, Circumferential Lower Leg) 1. Cleansed with: Cleanse wound with antibacterial soap and water 3. Peri-wound Care: Moisturizing lotion 5. Secondary Dressing Applied ABD Pad 7. Secured with 4 Layer Compression System - Bilateral Electronic Signature(s) Signed: 10/17/2015 4:17:15 PM By: Lucrezia Starchoseboro, RN, Sendra Entered By: Lucrezia Starchoseboro, RN, Sendra on 10/17/2015 13:10:10 Tankard, Levora AngelANNIE L. ([id number]) -------------------------------------------------------------------------------- Vitals Details Patient Name: [name], [name] 10/17/2015 12:45 Date of Service: PM Medical Record [id number] Number: Patient Account Number: [id number] 11-05-1940 (74 y.o. Treating RN: Leonard Downingoseboro, Sendra Date of Birth/Sex: Female) Other Clinician: Primary Care Physician: Elizabeth SauerJones, Deanna Treating Evlyn KannerBritto, Errol Referring Physician: Elizabeth SauerJones, Deanna Physician/Extender: Tania AdeWeeks in Treatment: 3 Vital Signs Time Taken: 13:00 Temperature (F): 97.7 Pulse (bpm): 74 Blood Pressure (mmHg): 125/69  Reference Range: 80 - 120 mg / dl Electronic  Signature(s) Signed: 10/17/2015 4:17:15 PM By: Lucrezia Starch RN, Sendra Entered By: Lucrezia Starch RN, Sendra on 10/17/2015 13:11:31

## 2015-10-17 NOTE — Progress Notes (Addendum)
[name], [name] ([id number]) Visit Report for 10/17/2015 Chief Complaint Document Details Patient Name: [name], [name] 10/17/2015 12:45 Date of Service: PM Medical Record [id number] Number: Patient Account Number: [id number] 31-Mar-1941 (75 y.o. Treating RN: Leonard Downingoseboro, Sendra Date of Birth/Sex: Female) Other Clinician: Primary Care Physician: Elizabeth SauerJones, Deanna Treating Evlyn KannerBritto, Aishia Barkey Referring Physician: Elizabeth SauerJones, Deanna Physician/Extender: Tania AdeWeeks in Treatment: 3 Information Obtained from: Patient Chief Complaint Patient presents for treatment of an open ulcer due to venous insufficiency which has been continuing this way for several months Electronic Signature(s) Signed: 10/17/2015 1:30:47 PM By: Evlyn KannerBritto, Jaiyon Wander MD, FACS Entered By: Evlyn KannerBritto, Anil Havard on 10/17/2015 13:30:46 [name], [name] ([id number]) -------------------------------------------------------------------------------- HPI Details Patient Name: [name], [name] 10/17/2015 12:45 Date of Service: PM Medical Record [id number] Number: Patient Account Number: [id number] 31-Mar-1941 (74 y.o. Treating RN: Leonard Downing[place], Sendra Date of Birth/Sex: Female) Other Clinician: Primary Care Physician: Elizabeth SauerJones, Deanna Treating Evlyn KannerBritto, Hallel Denherder Referring Physician: Elizabeth SauerJones, Deanna Physician/Extender: Tania AdeWeeks in Treatment: 3 History of Present Illness Location: bilateral lower extremity wounds Quality: Patient reports experiencing heaviness to affected area(s). Severity: Patient states wound are getting worse. Duration: Patient has had the wound for > 8 months prior to seeking treatment at the wound center Timing: Pain in wound is Intermittent (comes and goes Context: The wound would happen gradually Modifying Factors: Other treatment(s) tried include:Unna's boots and treatment by Dr. Gilda CreaseSchnier at the vascular surgery office Associated Signs and Symptoms: Patient reports having increase discharge. HPI Description: 75 year old patient who is a poor  historian but most of the history is being obtained from Dr. Levora DredgeGregory Schnier her vascular surgeon who has been treating her for about 8 months to a year. the patient has a postphlebitic syndrome both lower extremities and has been under his care for a while. The ABIs done in 2013 were normal and he does not suspect any vascular arterial compromise as the resting ABI on the right was 1.03 on the left was 1.02. An ultrasound of the lower extremity bilaterally did not reveal any DVT or superficial thrombophlebitis bilaterally. Patient's initial problem started when she had a hematoma for right lower extremity due to being on Coumadin and then developed a lot of swelling and has been having Unna's boots for a long while. However recently her left lower extremity started swelling and oozing profusely and the patient did not want to have Unna's boots applied and hence Dr. Gilda CreaseSchnier has referred her to us for possible failure of 4 layer wraps with Profore. past medical history does not include diabetes mellitus but she has a cardiac issue and has had some element of CHF. She has been treated with lymphedema pumps in the past. 10/03/2015 -- the patient's lower extremities have been weeping very significantly and she has had a lot of swelling and redness since the last few days. She was seen by Dr. Gilda CreaseSchnier this morning and he had recommended admission to the hospital but the patient was unwilling to have that done and came here for a second opinion. 10/10/2015 -- was admitted to the hospital March 27 to 10/07/2015 for bilateral cellulitis of lower extremities and also had chronic A. fib, chronic systolic heart failure, venous insufficiency of the legs and coronary artery disease. She was given 5 days of vancomycin and ampicillin and sulbactam. She was seen by Dr. Wyn Quakerew and while she was inpatient and he recommended continuing with compression and oral antibiotics ( Augmentin ) at least for 2  weeks. Electronic Signature(s) Signed: 10/17/2015 1:30:52 PM By: Evlyn KannerBritto, Mala Gibbard MD, FACS [name], [name] (  [id number]) Entered By: Evlyn Kanner on 10/17/2015 13:30:52 [name] ([id number]) -------------------------------------------------------------------------------- Physical Exam Details Patient Name: [name], [name] 10/17/2015 12:45 Date of Service: PM Medical Record [id number] Number: Patient Account Number: [id number] Sep 02, 1940 (74 y.o. Treating RN: Leonard Downing Date of Birth/Sex: Female) Other Clinician: Primary Care Physician: Elizabeth Sauer Treating Evlyn Kanner Referring Physician: Elizabeth Sauer Physician/Extender: Tania Ade in Treatment: 3 Constitutional . Pulse regular. Respirations normal and unlabored. Afebrile. . Eyes Nonicteric. Reactive to light. Ears, Nose, Mouth, and Throat Lips, teeth, and gums WNL.Marland Kitchen Moist mucosa without lesions. Neck supple and nontender. No palpable supraclavicular or cervical adenopathy. Normal sized without goiter. Respiratory WNL. No retractions.. Cardiovascular Pedal Pulses WNL. No clubbing, cyanosis or edema. Lymphatic No adneopathy. No adenopathy. No adenopathy. Musculoskeletal Adexa without tenderness or enlargement.. Digits and nails w/o clubbing, cyanosis, infection, petechiae, ischemia, or inflammatory conditions.. Integumentary (Hair, Skin) No suspicious lesions. No crepitus or fluctuance. No peri-wound warmth or erythema. No masses.Marland Kitchen Psychiatric Judgement and insight Intact.. No evidence of depression, anxiety, or agitation.. Notes the patient has a lot of dry scaly skin but the erythema has gone down significantly and there is no cellulitis. The weeping is also stopped and overall there is great improvement. Electronic Signature(s) Signed: 10/17/2015 1:31:33 PM By: Evlyn Kanner MD, FACS Entered By: Evlyn Kanner on 10/17/2015 13:31:32 [name], [name]  ([id number]) -------------------------------------------------------------------------------- Physician Orders Details Patient Name: [name], [name] 10/17/2015 12:45 Date of Service: PM Medical Record [id number] Number: Patient Account Number: [id number] 03-04-1941 (74 y.o. 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 o No tub bath. Wound #2 Right,Circumferential Lower Leg o Cleanse wound with mild soap and water o May shower with protection. o No tub bath. Skin Barriers/Peri-Wound Care Wound #1 Left,Circumferential Lower Leg o Moisturizing lotion Wound #2 Right,Circumferential Lower Leg o Moisturizing lotion Primary Wound Dressing Wound #1 Left,Circumferential Lower Leg o ABD Pad Wound #2 Right,Circumferential Lower Leg [name], [name] ([id number]) o ABD Pad Dressing Change Frequency Wound #1 Left,Circumferential Lower Leg o Dressing is to be changed Monday and Thursday. Wound #2 Right,Circumferential Lower Leg o Dressing is to be changed Monday and Thursday. Follow-up Appointments Wound #1 Left,Circumferential Lower  Leg o Return Appointment in 1 week. Wound #2 Right,Circumferential Lower Leg o Return Appointment in 1 week. o Return Appointment in 1 week. Edema Control Wound #1 Left,Circumferential Lower Leg o 4 Layer Compression System - Bilateral Wound #2 Right,Circumferential Lower Leg o 4 Layer Compression System - Bilateral o Other: - measure for compression stockings, please order patient juxta lite compression stockings measurements: (R) calf-36.5cm, ankle-3cm5, length-44.2cm / (L) calf-40.2cm, ankle-31cm, length- 44.5cm Additional Orders / Instructions Wound #1 Left,Circumferential Lower Leg o Increase protein intake. Wound #2 Right,Circumferential Lower Leg o Increase protein intake. Home Health Wound #1 Left,Circumferential Lower Leg o Continue Home Health Visits o Home Health Nurse  may visit PRN to address patientos wound care needs. o FACE TO FACE ENCOUNTER: MEDICARE and MEDICAID PATIENTS: I certify that this patient is under my care and that I had a face-to-face encounter that meets the physician face-to-face encounter requirements with this patient on this date. The encounter with the patient was in whole or in part for the following MEDICAL CONDITION: (primary reason for Home Healthcare) MEDICAL NECESSITY: I certify, that based on my findings, NURSING services are a medically necessary home health service. HOME BOUND STATUS: I certify that my clinical findings support that this patient is homebound (i.e., Due to illness or injury, pt requires aid of supportive devices such as crutches, cane, wheelchairs, walkers, the use of special transportation or the assistance of another person to leave their place of residence. There is a [name], [name] ([id number]) normal inability to leave the home and doing so requires considerable and taxing effort. Other absences are for medical reasons / religious services and are infrequent or of short duration when for other  reasons). o If current dressing causes regression in wound condition, may D/C ordered dressing product/s and apply Normal Saline Moist Dressing daily until next Wound Healing Center / Other MD appointment. Notify Wound Healing Center of regression in wound condition at [phone]. o Please direct any NON-WOUND related issues/requests for orders to patient's Primary Care Physician Wound #2 Right,Circumferential Lower Leg o Continue Home Health Visits o Home Health Nurse may visit PRN to address patientos wound care needs. o FACE TO FACE ENCOUNTER: MEDICARE and MEDICAID PATIENTS: I certify that this patient is under my care and that I had a face-to-face encounter that meets the physician face-to-face encounter requirements with this patient on this date. The encounter with the patient was in whole or in part for the following MEDICAL CONDITION: (primary reason for Home Healthcare) MEDICAL NECESSITY: I certify, that based on my findings, NURSING services are a medically necessary home health service. HOME BOUND STATUS: I certify that my clinical findings support that this patient is homebound (i.e., Due to illness or injury, pt requires aid of supportive devices such as crutches, cane, wheelchairs, walkers, the use of special transportation or the assistance of another person to leave their place of residence. There is a normal inability to leave the home and doing so requires considerable and taxing effort. Other absences are for medical reasons / religious services and are infrequent or of short duration when for other reasons). o If current dressing causes regression in wound condition, may D/C ordered dressing product/s and apply Normal Saline Moist Dressing daily until next Wound Healing Center / Other MD appointment. Notify Wound Healing Center of regression in wound condition at [phone]. o Please direct any NON-WOUND related issues/requests for orders to patient's  Primary Care Physician Electronic Signature(s) Signed: 10/17/2015 3:21:43 PM By: Evlyn Kanner MD, FACS Signed: 10/17/2015 4:17:15 PM By: Lucrezia Starch RN, Sendra Entered By: Lucrezia Starch RN, Sendra on 10/17/2015 14:45:31 Roedel, [name] ([id number]) -------------------------------------------------------------------------------- Problem List Details Patient Name: TEPHANIE, ESCORCIA 10/17/2015 12:45 Date of Service: PM Medical Record [id number] Number: Patient Account Number: [id number] 1940/10/14 (74 y.o. 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 of  left calf with fat layer 09/26/2015 Yes exposed L97.212 Non-pressure chronic ulcer of right calf with fat layer 09/26/2015 Yes exposed E66.3 Overweight 09/26/2015 Yes L03.116 Cellulitis of left lower limb 10/03/2015 Yes L03.115 Cellulitis of right lower limb 10/03/2015 Yes Inactive Problems Resolved Problems Electronic Signature(s) Signed: 10/17/2015 1:30:40 PM By: Evlyn Kanner MD, FACS Paul, [name] ([id number]) Entered By: Evlyn Kanner on 10/17/2015 13:30:40 Tenenbaum, [name] ([id number]) -------------------------------------------------------------------------------- Progress Note Details Patient Name: CYDNI, REDDOCH 10/17/2015 12:45 Date of Service: PM Medical Record [id number] Number: Patient Account Number: [id number] October 03, 1940 (74 y.o. Treating RN: Leonard Downing Date of Birth/Sex: Female) Other Clinician: Primary Care Physician: Elizabeth Sauer  Treating Evlyn Kanner Referring Physician: Elizabeth Sauer Physician/Extender: Tania Ade in Treatment: 3 Subjective Chief Complaint Information obtained from Patient Patient presents for treatment of an open ulcer due to venous insufficiency which has been continuing this way for several months History of Present Illness (HPI) The following HPI elements were documented for the patient's wound: Location: bilateral lower extremity wounds Quality: Patient reports experiencing heaviness to affected area(s). Severity: Patient states wound are getting worse. Duration: Patient has had the wound for > 8 months prior to seeking treatment at the wound center Timing: Pain in wound is Intermittent (comes and goes Context: The wound would happen gradually Modifying Factors: Other treatment(s) tried include:Unna's boots and treatment by Dr. Gilda Crease at the vascular surgery office Associated Signs and Symptoms: Patient reports having increase discharge. 75 year old patient who is a poor historian but most of the history is being obtained from Dr. Levora Dredge her vascular surgeon who has been treating her for about 8 months to a year. the patient has a postphlebitic syndrome both lower extremities and has been under his care for a while. The ABIs done in 2013 were normal and he does not suspect any vascular arterial compromise as the resting ABI on the right was 1.03 on the left was 1.02. An ultrasound of the lower extremity bilaterally did not reveal any DVT or superficial thrombophlebitis bilaterally. Patient's initial problem started when she had a hematoma for right lower extremity due to being on Coumadin and then developed a lot of swelling and has been having Unna's boots for a long while. However recently her left lower extremity started swelling and oozing profusely and the patient did not want to have Unna's boots applied and hence Dr. Gilda Crease has referred her to [place] for possible failure of 4  layer wraps with Profore. past medical history does not include diabetes mellitus but she has a cardiac issue and has had some element of CHF. She has been treated with lymphedema pumps in the past. 10/03/2015 -- the patient's lower extremities have been weeping very significantly and she has had a lot of swelling and redness since the last few days. She was seen by Dr. Gilda Crease this morning and he had recommended admission to the hospital but the patient was unwilling to have that done and came here for a second opinion. [name], [name] ([id number]) 10/10/2015 -- was admitted to the hospital March 27 to 10/07/2015 for bilateral cellulitis of lower extremities and also had chronic A. fib, chronic systolic heart failure, venous insufficiency of the legs and coronary artery disease. She was given 5 days of vancomycin and ampicillin and sulbactam. She was seen by Dr. Wyn Quaker and while she was inpatient and he recommended continuing with compression and oral antibiotics ( Augmentin ) at least for 2 weeks. Objective Constitutional Pulse regular. Respirations normal and unlabored. Afebrile. Vitals Time  Taken: 1:00 PM, Temperature: 97.7 F, Pulse: 74 bpm, Blood Pressure: 125/69 mmHg. Eyes Nonicteric. Reactive to light. Ears, Nose, Mouth, and Throat Lips, teeth, and gums WNL.Marland Kitchen Moist mucosa without lesions. Neck supple and nontender. No palpable supraclavicular or cervical adenopathy. Normal sized without goiter. Respiratory WNL. No retractions.. Cardiovascular Pedal Pulses WNL. No clubbing, cyanosis or edema. Lymphatic No adneopathy. No adenopathy. No adenopathy. Musculoskeletal Adexa without tenderness or enlargement.. Digits and nails w/o clubbing, cyanosis, infection, petechiae, ischemia, or inflammatory conditions.Marland Kitchen Psychiatric Judgement and insight Intact.. No evidence of depression, anxiety, or agitation.. General Notes: the patient has a lot of dry scaly skin but the erythema has gone  down significantly and there is no cellulitis. The weeping is also stopped and overall there is great improvement. Integumentary (Hair, Skin) No suspicious lesions. No crepitus or fluctuance. No peri-wound warmth or erythema. No masses.Marland Kitchen [name], [name] ([id number]) Wound #1 status is Open. Original cause of wound was Gradually Appeared. The wound is located on the Left,Circumferential Lower Leg. The wound measures 9cm length x 11cm width x 0.1cm depth; 77.754cm^2 area and 7.775cm^3 volume. Wound #2 status is Open. Original cause of wound was Gradually Appeared. The wound is located on the Right,Circumferential Lower Leg. The wound measures 9cm length x 22cm width x 0.1cm depth; 155.509cm^2 area and 15.551cm^3 volume. 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 No tub bath. Wound #2 Right,Circumferential Lower Leg: Cleanse wound with mild soap and water May shower with protection. No tub bath. Skin Barriers/Peri-Wound Care: Wound #1 Left,Circumferential Lower Leg: Moisturizing lotion Wound #2 Right,Circumferential Lower Leg: Moisturizing lotion Primary Wound Dressing: Wound #1 Left,Circumferential Lower Leg: ABD Pad Wound #2 Right,Circumferential Lower Leg: [name], Shaughnessy L. ([id number]) ABD Pad Dressing Change Frequency: Wound #1 Left,Circumferential Lower Leg: Dressing is to be changed Monday and Thursday. Wound #2 Right,Circumferential  Lower Leg: Dressing is to be changed Monday and Thursday. Follow-up Appointments: Wound #1 Left,Circumferential Lower Leg: Return Appointment in 1 week. Wound #2 Right,Circumferential Lower Leg: Return Appointment in 1 week. Return Appointment in 1 week. Edema Control: Wound #1 Left,Circumferential Lower Leg: 4 Layer Compression System - Bilateral Wound #2 Right,Circumferential Lower Leg: 4 Layer Compression System - Bilateral Other: - measure for compression stockings, please order patient juxta lite compression stockings measurements: (R) calf-36.5cm, ankle-3cm5, length-44.2cm / (L) calf-40.2cm, ankle-31cm, length-44.5cm Additional Orders / Instructions: Wound #1 Left,Circumferential Lower Leg: Increase protein intake. Wound #2 Right,Circumferential Lower Leg: Increase protein intake. Home Health: Wound #1 Left,Circumferential Lower Leg: Continue Home Health Visits Home Health Nurse may visit PRN to address patient s wound care needs. FACE TO FACE ENCOUNTER: MEDICARE and MEDICAID PATIENTS: I certify that this patient is under my care and that I had a face-to-face encounter that meets the physician face-to-face encounter requirements with this patient on this date. The encounter with the patient was in whole or in part for the following MEDICAL CONDITION: (primary reason for Home Healthcare) MEDICAL NECESSITY: I certify, that based on my findings, NURSING services are a medically necessary home health service. HOME BOUND  STATUS: I certify that my clinical findings support that this patient is homebound (i.e., Due to illness or injury, pt requires aid of supportive devices such as crutches, cane, wheelchairs, walkers, the use of special transportation or the assistance of another person to leave their place of residence. There is a normal inability to leave the home and doing so requires considerable and taxing effort. Other absences are for medical reasons / religious services and  are infrequent or of short duration when for other reasons). If current dressing causes regression in wound condition, may D/C ordered dressing product/s and apply Normal Saline Moist Dressing daily until next Wound Healing Center / Other MD appointment. Notify Wound Healing Center of regression in wound condition at [phone]. Please direct any NON-WOUND related issues/requests for orders to patient's Primary Care Physician Wound #2 Right,Circumferential Lower Leg: Continue Home Health Visits Home Health Nurse may visit PRN to address patient s wound care needs. FACE TO FACE ENCOUNTER: MEDICARE and MEDICAID PATIENTS: I certify that this patient is under my care and that I had a face-to-face encounter that meets the physician face-to-face encounter requirements with this patient on this date. The encounter with the patient was in whole or in part for the following MEDICAL CONDITION: (primary reason for Home Healthcare) MEDICAL NECESSITY: I certify, that based on my findings, NURSING services are a medically necessary home health service. HOME BOUND STATUS: I certify that my clinical findings support that this patient is homebound (i.e., Due to TSERING, LEAMAN. ([id number]) illness or injury, pt requires aid of supportive devices such as crutches, cane, wheelchairs, walkers, the use of special transportation or the assistance of another person to leave their place of residence. There is a normal inability to leave the home and doing so requires considerable and taxing effort. Other absences are for medical reasons / religious services and are infrequent or of short duration when for other reasons). If current dressing causes regression in wound condition, may D/C ordered dressing product/s and apply Normal Saline Moist Dressing daily until next Wound Healing Center / Other MD appointment. Notify Wound Healing Center of regression in wound condition at [phone]. Please direct any NON-WOUND  related issues/requests for orders to patient's Primary Care Physician We will continue to use a 4-layer Profore wrap and this will be changed on Friday by home health. If she looks good and we can possibly put her in a compression stockings and continue with elevation and exercise. I have ordered her juxta light compression stockings and hopefully she can bring them with her next week Electronic Signature(s) Signed: 10/17/2015 3:26:17 PM By: Evlyn Kanner MD, FACS Previous Signature: 10/17/2015 1:32:36 PM Version By: Evlyn Kanner MD, FACS Entered By: Evlyn Kanner on 10/17/2015 15:26:17 Willhelm, Kellsie Elbert Ewings ([id number]) -------------------------------------------------------------------------------- SuperBill Details Patient Name: Buffkin, Areana L. Date of Service: 10/17/2015 Medical Record Number: [id number] Patient Account Number: [id number] Date of Birth/Sex: 09/29/1940 (75 y.o. 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 Physician Procedures CPT4: Description Modifier Quantity Code [id number] - WC PHYS LEVEL 3 - EST PT 1 ICD-10 Description Diagnosis I87.031 Postthrombotic syndrome with ulcer and  inflammation of right lower extremity L97.222 Non-pressure chronic ulcer of left calf with fat  layer exposed I87.032 Postthrombotic syndrome with ulcer and inflammation of left lower extremity L97.212 Non-pressure chronic ulcer of right calf with fat layer exposed Electronic Signature(s) Signed: 10/17/2015 3:21:43 PM By: Evlyn Kanner MD, FACS Signed: 10/17/2015 4:17:15 PM By: Lucrezia Starch RN, [place] Previous Signature: 10/17/2015 1:33:13 PM Version By: Evlyn Kanner MD, FACS ADDILYNN, MOWRER ([id number]) Entered By: Maureen Chatters on 10/17/2015 14:04:38

## 2015-10-18 DIAGNOSIS — I482 Chronic atrial fibrillation: Secondary | ICD-10-CM | POA: Diagnosis not present

## 2015-10-18 DIAGNOSIS — I5022 Chronic systolic (congestive) heart failure: Secondary | ICD-10-CM | POA: Diagnosis not present

## 2015-10-18 DIAGNOSIS — I872 Venous insufficiency (chronic) (peripheral): Secondary | ICD-10-CM | POA: Diagnosis not present

## 2015-10-18 DIAGNOSIS — I340 Nonrheumatic mitral (valve) insufficiency: Secondary | ICD-10-CM | POA: Diagnosis not present

## 2015-10-19 DIAGNOSIS — I340 Nonrheumatic mitral (valve) insufficiency: Secondary | ICD-10-CM | POA: Diagnosis not present

## 2015-10-19 DIAGNOSIS — S8011XD Contusion of right lower leg, subsequent encounter: Secondary | ICD-10-CM | POA: Diagnosis not present

## 2015-10-19 DIAGNOSIS — I872 Venous insufficiency (chronic) (peripheral): Secondary | ICD-10-CM | POA: Diagnosis not present

## 2015-10-19 DIAGNOSIS — D509 Iron deficiency anemia, unspecified: Secondary | ICD-10-CM | POA: Diagnosis not present

## 2015-10-19 DIAGNOSIS — Z9181 History of falling: Secondary | ICD-10-CM | POA: Diagnosis not present

## 2015-10-19 DIAGNOSIS — Z87891 Personal history of nicotine dependence: Secondary | ICD-10-CM | POA: Diagnosis not present

## 2015-10-19 DIAGNOSIS — I4891 Unspecified atrial fibrillation: Secondary | ICD-10-CM | POA: Diagnosis not present

## 2015-10-19 DIAGNOSIS — I110 Hypertensive heart disease with heart failure: Secondary | ICD-10-CM | POA: Diagnosis not present

## 2015-10-19 DIAGNOSIS — I5022 Chronic systolic (congestive) heart failure: Secondary | ICD-10-CM | POA: Diagnosis not present

## 2015-10-19 DIAGNOSIS — I272 Other secondary pulmonary hypertension: Secondary | ICD-10-CM | POA: Diagnosis not present

## 2015-10-21 DIAGNOSIS — Z87891 Personal history of nicotine dependence: Secondary | ICD-10-CM | POA: Diagnosis not present

## 2015-10-21 DIAGNOSIS — D509 Iron deficiency anemia, unspecified: Secondary | ICD-10-CM | POA: Diagnosis not present

## 2015-10-21 DIAGNOSIS — Z9181 History of falling: Secondary | ICD-10-CM | POA: Diagnosis not present

## 2015-10-21 DIAGNOSIS — I872 Venous insufficiency (chronic) (peripheral): Secondary | ICD-10-CM | POA: Diagnosis not present

## 2015-10-21 DIAGNOSIS — I5022 Chronic systolic (congestive) heart failure: Secondary | ICD-10-CM | POA: Diagnosis not present

## 2015-10-21 DIAGNOSIS — I272 Other secondary pulmonary hypertension: Secondary | ICD-10-CM | POA: Diagnosis not present

## 2015-10-21 DIAGNOSIS — I340 Nonrheumatic mitral (valve) insufficiency: Secondary | ICD-10-CM | POA: Diagnosis not present

## 2015-10-21 DIAGNOSIS — I110 Hypertensive heart disease with heart failure: Secondary | ICD-10-CM | POA: Diagnosis not present

## 2015-10-21 DIAGNOSIS — S8011XD Contusion of right lower leg, subsequent encounter: Secondary | ICD-10-CM | POA: Diagnosis not present

## 2015-10-21 DIAGNOSIS — I4891 Unspecified atrial fibrillation: Secondary | ICD-10-CM | POA: Diagnosis not present

## 2015-10-24 ENCOUNTER — Encounter (HOSPITAL_BASED_OUTPATIENT_CLINIC_OR_DEPARTMENT_OTHER): Payer: Medicare Other | Admitting: General Surgery

## 2015-10-24 DIAGNOSIS — I87032 Postthrombotic syndrome with ulcer and inflammation of left lower extremity: Secondary | ICD-10-CM | POA: Diagnosis not present

## 2015-10-24 DIAGNOSIS — I87031 Postthrombotic syndrome with ulcer and inflammation of right lower extremity: Secondary | ICD-10-CM | POA: Diagnosis not present

## 2015-10-24 DIAGNOSIS — I872 Venous insufficiency (chronic) (peripheral): Secondary | ICD-10-CM | POA: Diagnosis not present

## 2015-10-24 DIAGNOSIS — I5022 Chronic systolic (congestive) heart failure: Secondary | ICD-10-CM | POA: Diagnosis not present

## 2015-10-24 DIAGNOSIS — L03116 Cellulitis of left lower limb: Secondary | ICD-10-CM

## 2015-10-24 DIAGNOSIS — I890 Lymphedema, not elsewhere classified: Secondary | ICD-10-CM | POA: Diagnosis not present

## 2015-10-24 DIAGNOSIS — R600 Localized edema: Secondary | ICD-10-CM | POA: Diagnosis not present

## 2015-10-24 DIAGNOSIS — Z87891 Personal history of nicotine dependence: Secondary | ICD-10-CM | POA: Diagnosis not present

## 2015-10-24 DIAGNOSIS — Z86718 Personal history of other venous thrombosis and embolism: Secondary | ICD-10-CM | POA: Diagnosis not present

## 2015-10-24 DIAGNOSIS — L97212 Non-pressure chronic ulcer of right calf with fat layer exposed: Secondary | ICD-10-CM | POA: Diagnosis not present

## 2015-10-24 DIAGNOSIS — I4891 Unspecified atrial fibrillation: Secondary | ICD-10-CM | POA: Diagnosis not present

## 2015-10-24 DIAGNOSIS — I2510 Atherosclerotic heart disease of native coronary artery without angina pectoris: Secondary | ICD-10-CM | POA: Diagnosis not present

## 2015-10-24 DIAGNOSIS — L03115 Cellulitis of right lower limb: Secondary | ICD-10-CM

## 2015-10-24 DIAGNOSIS — L97222 Non-pressure chronic ulcer of left calf with fat layer exposed: Secondary | ICD-10-CM | POA: Diagnosis not present

## 2015-10-24 DIAGNOSIS — I110 Hypertensive heart disease with heart failure: Secondary | ICD-10-CM | POA: Diagnosis not present

## 2015-10-24 DIAGNOSIS — I482 Chronic atrial fibrillation: Secondary | ICD-10-CM | POA: Diagnosis not present

## 2015-10-24 NOTE — Progress Notes (Signed)
See I heal note 

## 2015-10-24 NOTE — Progress Notes (Addendum)
[name], [name] ([id number]) Visit Report for 10/24/2015 Chief Complaint Document Details Patient Name: [name], [name] 10/24/2015 2:15 Date of Service: PM Medical Record [id number] Number: Patient Account Number: [id number] Dec 08, 1940 (75 y.o. Treating RN: Curtis Sitesorthy, Joanna Date of Birth/Sex: Female) Other Clinician: Primary Care Physician: Elita BooneJones, Deanna Treating Winter Trefz Referring Physician: Elizabeth SauerJones, Deanna Physician/Extender: Tania AdeWeeks in Treatment: 4 Information Obtained from: Patient Chief Complaint Patient presents for treatment of an open ulcer due to venous insufficiency which has been continuing this way for several months Electronic Signature(s) Signed: 10/24/2015 3:13:10 PM By: Ardath SaxParker, Donley Harland MD Entered By: Ardath SaxParker, Barbra Miner on 10/24/2015 15:13:09 Mckercher, [name] ([id number]) -------------------------------------------------------------------------------- HPI Details Patient Name: [name], [name] 10/24/2015 2:15 Date of Service: PM Medical Record [id number] Number: Patient Account Number: [id number] Dec 08, 1940 (74 y.o. Treating RN: Curtis Sitesorthy, Joanna Date of Birth/Sex: Female) Other Clinician: Primary Care Physician: Elita BooneJones, Deanna Treating Omaree Fuqua Referring Physician: Elizabeth SauerJones, Deanna Physician/Extender: Tania AdeWeeks in Treatment: 4 History of Present Illness Location: bilateral lower extremity wounds Quality: Patient reports experiencing heaviness to affected area(s). Severity: Patient states wound are getting worse. Duration: Patient has had the wound for > 8 months prior to seeking treatment at the wound center Timing: Pain in wound is Intermittent (comes and goes Context: The wound would happen gradually Modifying Factors: Other treatment(s) tried include:Unna's boots and treatment by Dr. Gilda CreaseSchnier at the vascular surgery office Associated Signs and Symptoms: Patient reports having increase discharge. HPI Description: 75 year old patient who is a poor historian but most  of the history is being obtained from Dr. Levora DredgeGregory Schnier her vascular surgeon who has been treating her for about 8 months to a year. the patient has a postphlebitic syndrome both lower extremities and has been under his care for a while. The ABIs done in 2013 were normal and he does not suspect any vascular arterial compromise as the resting ABI on the right was 1.03 on the left was 1.02. An ultrasound of the lower extremity bilaterally did not reveal any DVT or superficial thrombophlebitis bilaterally. Patient's initial problem started when she had a hematoma for right lower extremity due to being on Coumadin and then developed a lot of swelling and has been having Unna's boots for a long while. However recently her left lower extremity started swelling and oozing profusely and the patient did not want to have Unna's boots applied and hence Dr. Gilda CreaseSchnier has referred her to us for possible failure of 4 layer wraps with Profore. past medical history does not include diabetes mellitus but she has a cardiac issue and has had some element of CHF. She has been treated with lymphedema pumps in the past. 10/03/2015 -- the patient's lower extremities have been weeping very significantly and she has had a lot of swelling and redness since the last few days. She was seen by Dr. Gilda CreaseSchnier this morning and he had recommended admission to the hospital but the patient was unwilling to have that done and came here for a second opinion. 10/10/2015 -- was admitted to the hospital March 27 to 10/07/2015 for bilateral cellulitis of lower extremities and also had chronic A. fib, chronic systolic heart failure, venous insufficiency of the legs and coronary artery disease. She was given 5 days of vancomycin and ampicillin and sulbactam. She was seen by Dr. Wyn Quakerew and while she was inpatient and he recommended continuing with compression and oral antibiotics ( Augmentin ) at least for 2 weeks. Electronic  Signature(s) Signed: 10/24/2015 3:13:25 PM By: Ardath SaxParker, Delorean Knutzen MD Zamorano, [name] ([id number]) Entered  By: Ardath Sax on 10/24/2015 15:13:25 Tinkle, [name] ([id number]) -------------------------------------------------------------------------------- Physical Exam Details Patient Name: [name], [name] 10/24/2015 2:15 Date of Service: PM Medical Record [id number] Number: Patient Account Number: [id number] 30-Mar-1941 (75 y.o. Treating RN: Curtis Sites Date of Birth/Sex: Female) Other Clinician: Primary Care Physician: Elita Boone, Renee Beale Referring Physician: Elizabeth Sauer Physician/Extender: Tania Ade in Treatment: 4 Electronic Signature(s) Signed: 10/24/2015 3:13:31 PM By: Ardath Sax MD Entered By: Ardath Sax on 10/24/2015 15:13:30 Sinha, [name] ([id number]) -------------------------------------------------------------------------------- Physician Orders Details Patient Name: [name], [name] 10/24/2015 2:15 Date of Service: PM Medical Record [id number] Number: Patient Account Number: [id number] Nov 27, 1940 (74 y.o. 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 o No tub bath. Wound #2 Right,Circumferential Lower Leg o Cleanse wound with mild soap and water o May shower with protection. o No tub bath. Skin Barriers/Peri-Wound Care Wound #1 Left,Circumferential Lower Leg o Moisturizing lotion Wound #2 Right,Circumferential Lower Leg o Moisturizing lotion Primary Wound Dressing Wound #1 Left,Circumferential Lower Leg o ABD Pad o Other: - TCA cream in clinic Revere, Edythe L. ([id number]) Wound #2 Right,Circumferential Lower Leg o ABD Pad o Other: - TCA cream in clinic Dressing Change Frequency Wound #1 Left,Circumferential Lower Leg o Dressing is to be changed Monday and Thursday. Wound #2 Right,Circumferential Lower Leg o Dressing is to be changed Monday and Thursday. Follow-up Appointments Wound #1 Left,Circumferential Lower Leg o Return Appointment in 1 week. Wound #2 Right,Circumferential Lower Leg o Return Appointment in 1 week. o Return Appointment in 1 week. Edema Control Wound #1 Left,Circumferential Lower Leg o 4 Layer Compression System - Bilateral Wound #2 Right,Circumferential Lower Leg o 4 Layer Compression System - Bilateral o Other: - measure for compression stockings, please order patient juxta lite compression stockings measurements: (R) calf-36.5cm, ankle-3cm5, length-44.2cm / (L) calf-40.2cm, ankle-31cm, length- 44.5cm Additional Orders / Instructions Wound #1 Left,Circumferential Lower Leg o Increase protein intake. Wound #2 Right,Circumferential Lower Leg o Increase protein intake. Home Health Wound #1 Left,Circumferential Lower Leg o Continue Home Health Visits o Home Health Nurse may visit PRN to address patientos wound care needs. o FACE TO FACE ENCOUNTER: MEDICARE and MEDICAID PATIENTS: I certify that this patient is under my care and that I had a face-to-face encounter that meets the physician face-to-face encounter  requirements with this patient on this date. The encounter with the patient was in whole or in part for the following MEDICAL CONDITION: (primary reason for Home Healthcare) MEDICAL NECESSITY: I certify, that based on my findings, NURSING services are a medically necessary home health service. HOME BOUND STATUS: I certify that my clinical findings [name], [name] ([id number]) support that this patient is homebound (i.e., Due to illness or injury, pt requires aid of supportive devices such as crutches, cane, wheelchairs, walkers, the use of special transportation or the assistance of  another person to leave their place of residence. There is a normal inability to leave the home and doing so requires considerable and taxing effort. Other absences are for medical reasons / religious services and are infrequent or of short duration when for other reasons). o If current dressing causes regression in wound condition, may D/C ordered dressing product/s and apply Normal Saline Moist Dressing daily until next Wound Healing Center / Other MD appointment. Notify Wound Healing Center of regression in wound condition at [phone]. o Please direct any NON-WOUND related issues/requests for orders to patient's Primary Care Physician Wound #2 Right,Circumferential Lower Leg o Continue Home Health Visits o Home Health Nurse may visit PRN to address patientos wound care needs. o FACE TO FACE ENCOUNTER: MEDICARE and MEDICAID PATIENTS: I certify that this patient is under my care and that I had a face-to-face encounter that meets the physician face-to-face encounter requirements with this patient on this date. The encounter with the patient was in whole or in part for the following MEDICAL CONDITION: (primary reason for Home Healthcare) MEDICAL NECESSITY: I certify, that based on my findings, NURSING services are a medically necessary home health service. HOME BOUND STATUS: I certify that my  clinical findings support that this patient is homebound (i.e., Due to illness or injury, pt requires aid of supportive devices such as crutches, cane, wheelchairs, walkers, the use of special transportation or the assistance of another person to leave their place of residence. There is a normal inability to leave the home and doing so requires considerable and taxing effort. Other absences are for medical reasons / religious services and are infrequent or of short duration when for other reasons). o If current dressing causes regression in wound condition, may D/C ordered dressing product/s and apply Normal Saline Moist Dressing daily until next Wound Healing Center / Other MD appointment. Notify Wound Healing Center of regression in wound condition at [phone]. o Please direct any NON-WOUND related issues/requests for orders to patient's Primary Care Physician Electronic Signature(s) Signed: 10/24/2015 4:18:35 PM By: Curtis Sites Signed: 10/25/2015 3:01:19 PM By: Ardath Sax MD Entered By: Curtis Sites on 10/24/2015 16:18:35 Cappello, [name] ([id number]) -------------------------------------------------------------------------------- Problem List Details Patient Name: [name], [name] 10/24/2015 2:15 Date of Service: PM Medical Record [id number] Number: Patient Account Number: [id number] 1941/01/17 (74 y.o. Treating RN: Curtis Sites Date of Birth/Sex: Female) Other Clinician: Primary Care Physician: Elita Boone, Ryan Palermo Referring Physician: Elizabeth Sauer Physician/Extender: Tania Ade in Treatment: 4 Active Problems ICD-10 Encounter Code Description Active Date Diagnosis I87.031 Postthrombotic syndrome with ulcer and inflammation of 09/26/2015 Yes right lower extremity I87.032 Postthrombotic syndrome with ulcer and inflammation of 09/26/2015 Yes left lower extremity L97.222 Non-pressure chronic ulcer of left calf with fat layer 09/26/2015  Yes exposed L97.212 Non-pressure chronic ulcer of right calf with fat layer 09/26/2015 Yes exposed E66.3 Overweight 09/26/2015 Yes L03.116 Cellulitis of left lower limb 10/03/2015 Yes L03.115 Cellulitis of right lower limb 10/03/2015 Yes Inactive Problems Resolved Problems Electronic Signature(s) Signed: 10/24/2015 3:13:01 PM By: Ardath Sax MD Zumstein, [name] ([id number]) Entered By: Ardath Sax on 10/24/2015 15:13:00 Deeley, [name] ([id number]) -------------------------------------------------------------------------------- Progress Note Details Patient Name: [name], [name] 10/24/2015 2:15 Date of Service: PM Medical Record [id number] Number: Patient Account Number: [id number] Jun 25, 1941 (74 y.o. Treating RN: Curtis Sites Date of Birth/Sex: Female) Other Clinician: Primary Care Physician: Elita Boone, Mariadelaluz Guggenheim Referring Physician: Elizabeth Sauer Physician/Extender: Tania Ade in Treatment: 4 Subjective Chief Complaint Information obtained from Patient Patient presents for treatment of  an open ulcer due to venous insufficiency which has been continuing this way for several months History of Present Illness (HPI) The following HPI elements were documented for the patient's wound: Location: bilateral lower extremity wounds Quality: Patient reports experiencing heaviness to affected area(s). Severity: Patient states wound are getting worse. Duration: Patient has had the wound for > 8 months prior to seeking treatment at the wound center Timing: Pain in wound is Intermittent (comes and goes Context: The wound would happen gradually Modifying Factors: Other treatment(s) tried include:Unna's boots and treatment by Dr. Gilda Crease at the vascular surgery office Associated Signs and Symptoms: Patient reports having increase discharge. 75 year old patient who is a poor historian but most of the history is being obtained from Dr. Levora Dredge her vascular surgeon  who has been treating her for about 8 months to a year. the patient has a postphlebitic syndrome both lower extremities and has been under his care for a while. The ABIs done in 2013 were normal and he does not suspect any vascular arterial compromise as the resting ABI on the right was 1.03 on the left was 1.02. An ultrasound of the lower extremity bilaterally did not reveal any DVT or superficial thrombophlebitis bilaterally. Patient's initial problem started when she had a hematoma for right lower extremity due to being on Coumadin and then developed a lot of swelling and has been having Unna's boots for a long while. However recently her left lower extremity started swelling and oozing profusely and the patient did not want to have Unna's boots applied and hence Dr. Gilda Crease has referred her to [place] for possible failure of 4 layer wraps with Profore. past medical history does not include diabetes mellitus but she has a cardiac issue and has had some element of CHF. She has been treated with lymphedema pumps in the past. 10/03/2015 -- the patient's lower extremities have been weeping very significantly and she has had a lot of swelling and redness since the last few days. She was seen by Dr. Gilda Crease this morning and he had recommended admission to the hospital but the patient was unwilling to have that done and came here for a second opinion. [name], [name] ([id number]) 10/10/2015 -- was admitted to the hospital March 27 to 10/07/2015 for bilateral cellulitis of lower extremities and also had chronic A. fib, chronic systolic heart failure, venous insufficiency of the legs and coronary artery disease. She was given 5 days of vancomycin and ampicillin and sulbactam. She was seen by Dr. Wyn Quaker and while she was inpatient and he recommended continuing with compression and oral antibiotics ( Augmentin ) at least for 2 weeks. Objective Constitutional Vitals Time Taken: 2:20 PM, Temperature: 98.2  F, Pulse: 67 bpm, Respiratory Rate: 20 breaths/min, Blood Pressure: 142/93 mmHg. Integumentary (Hair, Skin) Wound #1 status is Open. Original cause of wound was Gradually Appeared. The wound is located on the Left,Circumferential Lower Leg. The wound measures 8cm length x 9.5cm width x 0.1cm depth; 59.69cm^2 area and 5.969cm^3 volume. The wound is limited to skin breakdown. There is no tunneling or undermining noted. There is a medium amount of serous drainage noted. The wound margin is flat and intact. There is large (67-100%) red granulation within the wound bed. There is no necrotic tissue within the wound bed. The periwound skin appearance exhibited: Dry/Scaly, Moist. The periwound skin appearance did not exhibit: Callus, Crepitus, Excoriation, Fluctuance, Friable, Induration, Localized Edema, Rash, Scarring, Maceration, Atrophie Blanche, Cyanosis, Ecchymosis, Hemosiderin Staining, Mottled, Pallor, Rubor, Erythema. Wound #2  status is Open. Original cause of wound was Gradually Appeared. The wound is located on the Right,Circumferential Lower Leg. The wound measures 10.5cm length x 15cm width x 0.1cm depth; 123.7cm^2 area and 12.37cm^3 volume. The wound is limited to skin breakdown. There is no tunneling or undermining noted. There is a medium amount of serous drainage noted. The wound margin is flat and intact. There is large (67-100%) red granulation within the wound bed. There is no necrotic tissue within the wound bed. The periwound skin appearance exhibited: Dry/Scaly, Moist. The periwound skin appearance did not exhibit: Callus, Crepitus, Excoriation, Fluctuance, Friable, Induration, Localized Edema, Rash, Scarring, Maceration, Atrophie Blanche, Cyanosis, Ecchymosis, Hemosiderin Staining, Mottled, Pallor, Rubor, Erythema. 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 ([id number]) O96.295 - Non-pressure chronic ulcer of right calf with fat layer exposed E66.3 - Overweight L03.116 - Cellulitis of left lower limb L03.115 - Cellulitis of right lower limb Plan Bilateral venous ulcers and cellulitis legs. Treat with compression and antifungal cortisone cream. Legs still very edemayous Electronic Signature(s) Signed: 10/24/2015 3:16:15 PM By: Ardath Sax MD Entered By: Ardath Sax on 10/24/2015 15:16:14 [name], [name] ([id number]) -------------------------------------------------------------------------------- SuperBill Details Patient Name: [name], [name] Date of Service: 10/24/2015 Medical Record Number: [id number] Patient Account Number: [id number] Date of Birth/Sex: 05-10-1941 (75 y.o. 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 Physician Procedures CPT4: Description Modifier Quantity Code [id number] 99213 - WC PHYS LEVEL 3 - EST PT 1 ICD-10 Description Diagnosis I87.031 Postthrombotic syndrome with ulcer and inflammation of right  lower extremity I87.032 Postthrombotic syndrome with ulcer and  inflammation of left lower extremity Electronic Signature(s) Signed: 10/24/2015 4:19:07 PM By: Curtis Sites Signed: 10/25/2015 3:01:19 PM By: Ardath Sax MD Previous Signature: 10/24/2015 3:17:04 PM Version By: Ardath Sax MD Entered By: Curtis Sites on 10/24/2015 16:19:07

## 2015-10-25 NOTE — Progress Notes (Signed)
[name], Rufina L. ([id number]) Visit Report for 10/24/2015 Arrival Information Details Patient Name: [name], [name] Date of Service: 10/24/2015 2:15 PM Medical Record Number: [id number] Patient Account Number: [id number] Date of Birth/Sex: 1940/07/27 (75 y.o. Female) Treating RN: Curtis Sitesorthy, Joanna Primary Care Physician: Elizabeth SauerJones, Deanna Other Clinician: Referring Physician: Elizabeth SauerJones, Deanna Treating Physician/Extender: Elayne SnarePARKER, PETER Weeks in Treatment: 4 Visit Information History Since Last Visit Added or deleted any medications: No Patient Arrived: Dan HumphreysWalker Any new allergies or adverse reactions: No Arrival Time: 14:18 Had a fall or experienced change in No Accompanied By: self activities of daily living that may affect Transfer Assistance: None risk of falls: Patient Identification Verified: Yes Signs or symptoms of abuse/neglect since last No Secondary Verification Process Yes visito Completed: Hospitalized since last visit: No Patient Has Alerts: Yes Pain Present Now: No Patient Alerts: ABI: (R)1.03 done at [facility] ABI: (L)10.2 done at [facility] Electronic Signature(s) Signed: 10/24/2015 5:04:52 PM By: Curtis Sitesorthy, Joanna Entered By: Curtis Sitesorthy, Joanna on 10/24/2015 14:20:09 Hofbauer, Levora AngelANNIE L. ([id number]) -------------------------------------------------------------------------------- Encounter Discharge Information Details Patient Name: [name], [name] Date of Service: 10/24/2015 2:15 PM Medical Record Number: [id number] Patient Account Number: [id number] Date of Birth/Sex: 1940/07/27 (75 y.o. Female) Treating RN: Curtis Sitesorthy, Joanna Primary Care Physician: Elizabeth SauerJones, Deanna Other Clinician: Referring Physician: Elizabeth SauerJones, Deanna Treating Physician/Extender: Elayne SnarePARKER, PETER Weeks in Treatment: 4 Encounter Discharge Information Items Discharge Pain Level: 0 Discharge Condition: Stable Ambulatory Status: Walker Discharge Destination: Home Private Transportation: Auto Accompanied By: self Schedule  Follow-up Appointment: Yes Medication Reconciliation completed and No provided to Patient/Care Charlen Bakula: Patient Clinical Summary of Care: Declined Electronic Signature(s) Signed: 10/24/2015 4:20:12 PM By: Curtis Sitesorthy, Joanna Previous Signature: 10/24/2015 3:17:22 PM Version By: Ardath SaxParker, Peter MD Previous Signature: 10/24/2015 3:10:28 PM Version By: Gwenlyn PerkingMoore, Shelia Entered By: Curtis Sitesorthy, Joanna on 10/24/2015 16:20:12 Castleman, Levora AngelANNIE L. ([id number]) -------------------------------------------------------------------------------- Lower Extremity Assessment Details Patient Name: [name], [name] Date of Service: 10/24/2015 2:15 PM Medical Record Number: [id number] Patient Account Number: [id number] Date of Birth/Sex: 1940/07/27 (75 y.o. Female) Treating RN: Curtis Sitesorthy, Joanna Primary Care Physician: Elizabeth SauerJones, Deanna Other Clinician: Referring Physician: Elizabeth SauerJones, Deanna Treating Physician/Extender: Ardath SaxPARKER, PETER Weeks in Treatment: 4 Edema Assessment Assessed: [Left: No] [Right: No] Edema: [Left: Yes] [Right: Yes] Calf Left: Right: Point of Measurement: 34 cm From Medial Instep cm cm Ankle Left: Right: Point of Measurement: 11 cm From Medial Instep cm cm Vascular Assessment Pulses: Posterior Tibial Dorsalis Pedis Palpable: [Left:Yes] [Right:Yes] Extremity colors, hair growth, and conditions: Extremity Color: [Left:Mottled] [Right:Mottled] Hair Growth on Extremity: [Left:No] [Right:No] Temperature of Extremity: [Left:Warm] [Right:Warm] Capillary Refill: [Left:< 3 seconds] [Right:< 3 seconds] Electronic Signature(s) Signed: 10/24/2015 5:04:52 PM By: Curtis Sitesorthy, Joanna Entered By: Curtis Sitesorthy, Joanna on 10/24/2015 14:27:05 Freilich, Emberleigh L. ([id number]) -------------------------------------------------------------------------------- Multi Wound Chart Details Patient Name: [name], [name] Date of Service: 10/24/2015 2:15 PM Medical Record Number: [id number] Patient Account Number: [id number] Date of  Birth/Sex: 1940/07/27 (75 y.o. Female) Treating RN: Curtis Sitesorthy, Joanna Primary Care Physician: Elizabeth SauerJones, Deanna Other Clinician: Referring Physician: Elizabeth SauerJones, Deanna Treating Physician/Extender: Ardath SaxPARKER, PETER Weeks in Treatment: 4 Vital Signs Height(in): Pulse(bpm): 67 Weight(lbs): Blood Pressure 142/93 (mmHg): Body Mass Index(BMI): Temperature(F): 98.2 Respiratory Rate 20 (breaths/min): Photos: [1:No Photos] [2:No Photos] [N/A:N/A] Wound Location: [1:Left Lower Leg - Circumfernential] [2:Right Lower Leg - Circumfernential] [N/A:N/A] Wounding Event: [1:Gradually Appeared] [2:Gradually Appeared] [N/A:N/A] Primary Etiology: [1:Lymphedema] [2:Lymphedema] [N/A:N/A] Comorbid History: [1:Cataracts, Deep Vein Thrombosis, Hypertension, Peripheral Venous Disease] [2:Cataracts, Deep Vein Thrombosis, Hypertension, Peripheral Venous Disease] [N/A:N/A] Date Acquired: [1:09/07/2014] [2:09/07/2014] [N/A:N/A] Weeks of Treatment: [1:4] [2:4] [N/A:N/A] Wound Status: [1:Open] [2:Open] [N/A:N/A] Measurements  L x W x D 8x9.5x0.1 [2:10.5x15x0.1] [N/A:N/A] (cm) Area (cm) : [1:59.69] [2:123.7] [N/A:N/A] Volume (cm) : [1:5.969] [2:12.37] [N/A:N/A] % Reduction in Area: [1:93.10%] [2:55.10%] [N/A:N/A] % Reduction in Volume: 93.10% [2:55.10%] [N/A:N/A] Classification: [1:Partial Thickness] [2:Partial Thickness] [N/A:N/A] Exudate Amount: [1:Medium] [2:Medium] [N/A:N/A] Exudate Type: [1:Serous] [2:Serous] [N/A:N/A] Exudate Color: [1:amber] [2:amber] [N/A:N/A] Wound Margin: [1:Flat and Intact] [2:Flat and Intact] [N/A:N/A] Granulation Amount: [1:Large (67-100%)] [2:Large (67-100%)] [N/A:N/A] Granulation Quality: [1:Red] [2:Red] [N/A:N/A] Necrotic Amount: [1:None Present (0%)] [2:None Present (0%)] [N/A:N/A] Exposed Structures: [1:Fascia: No Fat: No Tendon: No Muscle: No] [2:Fascia: No Fat: No Tendon: No Muscle: No] [N/A:N/A] Joint: No Joint: No Bone: No Bone: No Limited to Skin Limited to Skin Breakdown  Breakdown Epithelialization: Large (67-100%) Large (67-100%) N/A Periwound Skin Texture: Edema: No Edema: No N/A Excoriation: No Excoriation: No Induration: No Induration: No Callus: No Callus: No Crepitus: No Crepitus: No Fluctuance: No Fluctuance: No Friable: No Friable: No Rash: No Rash: No Scarring: No Scarring: No Periwound Skin Moist: Yes Moist: Yes N/A Moisture: Dry/Scaly: Yes Dry/Scaly: Yes Maceration: No Maceration: No Periwound Skin Color: Atrophie Blanche: No Atrophie Blanche: No N/A Cyanosis: No Cyanosis: No Ecchymosis: No Ecchymosis: No Erythema: No Erythema: No Hemosiderin Staining: No Hemosiderin Staining: No Mottled: No Mottled: No Pallor: No Pallor: No Rubor: No Rubor: No Tenderness on No No N/A Palpation: Wound Preparation: Ulcer Cleansing: Other: Ulcer Cleansing: Other: N/A soap and water soap and water Topical Anesthetic Topical Anesthetic Applied: None Applied: None Treatment Notes Electronic Signature(s) Signed: 10/24/2015 4:17:37 PM By: Curtis Sites Entered By: Curtis Sites on 10/24/2015 16:17:37 Korman, Levora Angel ([id number]) -------------------------------------------------------------------------------- Multi-Disciplinary Care Plan Details Patient Name: Marie Burner L. Date of Service: 10/24/2015 2:15 PM Medical Record Number: [id number] Patient Account Number: [id number] Date of Birth/Sex: 1941/06/18 (75 y.o. Female) Treating RN: Curtis Sites Primary Care Physician: Elizabeth Sauer Other Clinician: Referring Physician: Elizabeth Sauer Treating Physician/Extender: Elayne Snare in Treatment: 4 Active Inactive Orientation to the Wound Care Program Nursing Diagnoses: Knowledge deficit related to the wound healing center program Goals: Patient/caregiver will verbalize understanding of the Wound Healing Center Program Date Initiated: 09/26/2015 Goal Status: Active Interventions: Provide education on orientation to  the wound center Notes: Venous Leg Ulcer Nursing Diagnoses: Actual venous Insuffiency (use after diagnosis is confirmed) Goals: Patient will maintain optimal edema control Date Initiated: 09/26/2015 Goal Status: Active Patient/caregiver will verbalize understanding of disease process and disease management Date Initiated: 09/26/2015 Goal Status: Active Interventions: Assess peripheral edema status every visit. Compression as ordered Provide education on venous insufficiency Notes: Wound/Skin Impairment Ebling, Janetta L. ([id number]) Nursing Diagnoses: Impaired tissue integrity Goals: Ulcer/skin breakdown will have a volume reduction of 30% by week 4 Date Initiated: 09/26/2015 Goal Status: Active Ulcer/skin breakdown will have a volume reduction of 50% by week 8 Date Initiated: 09/26/2015 Goal Status: Active Ulcer/skin breakdown will have a volume reduction of 80% by week 12 Date Initiated: 09/26/2015 Goal Status: Active Ulcer/skin breakdown will heal within 14 weeks Date Initiated: 09/26/2015 Goal Status: Active Interventions: Assess patient/caregiver ability to obtain necessary supplies Assess patient/caregiver ability to perform ulcer/skin care regimen upon admission and as needed Assess ulceration(s) every visit Provide education on ulcer and skin care Notes: Electronic Signature(s) Signed: 10/24/2015 4:17:30 PM By: Curtis Sites Entered By: Curtis Sites on 10/24/2015 16:17:30 Averhart, Levora Angel ([id number]) -------------------------------------------------------------------------------- Patient/Caregiver Education Details Patient Name: Marie Burner L. Date of Service: 10/24/2015 2:15 PM Medical Record Number: [id number] Patient Account Number: [id number] Date of Birth/Gender: 17-Nov-1940 (75 y.o. Female) Treating RN: Curtis Sites  Primary Care Physician: Elizabeth Sauer Other Clinician: Referring Physician: Elizabeth Sauer Treating Physician/Extender: Elayne Snare in Treatment: 4 Education Assessment Education Provided To: Patient Education Topics Provided Venous: Handouts: Other: elevation and ankle pumps for edema management Methods: Demonstration, Explain/Verbal Responses: State content correctly Electronic Signature(s) Signed: 10/24/2015 4:20:38 PM By: Curtis Sites Previous Signature: 10/24/2015 3:17:34 PM Version By: Ardath Sax MD Entered By: Curtis Sites on 10/24/2015 16:20:38 Frankowski, Levora Angel ([id number]) -------------------------------------------------------------------------------- Wound Assessment Details Patient Name: Gessel, Danice L. Date of Service: 10/24/2015 2:15 PM Medical Record Number: [id number] Patient Account Number: [id number] Date of Birth/Sex: 06/02/1941 (75 y.o. Female) Treating RN: Curtis Sites Primary Care Physician: Elizabeth Sauer Other Clinician: Referring Physician: Elizabeth Sauer Treating Physician/Extender: Ardath Sax Weeks in Treatment: 4 Wound Status Wound Number: 1 Primary Lymphedema Etiology: Wound Location: Left Lower Leg - Circumfernential Wound Open Status: Wounding Event: Gradually Appeared Comorbid Cataracts, Deep Vein Thrombosis, Date Acquired: 09/07/2014 History: Hypertension, Peripheral Venous Weeks Of Treatment: 4 Disease Clustered Wound: No Photos Photo Uploaded By: Curtis Sites on 10/24/2015 16:28:02 Wound Measurements Length: (cm) 8 % Reduction in Width: (cm) 9.5 % Reduction in Depth: (cm) 0.1 Epithelializati Area: (cm) 59.69 Tunneling: Volume: (cm) 5.969 Undermining: Area: 93.1% Volume: 93.1% on: Large (67-100%) No No Wound Description Classification: Partial Thickness Wound Margin: Flat and Intact Exudate Amount: Medium Exudate Type: Serous Exudate Color: amber Foul Odor After Cleansing: No Wound Bed Granulation Amount: Large (67-100%) Exposed Structure Granulation Quality: Red Fascia Exposed: No Necrotic Amount: None Present (0%) Fat  Layer Exposed: No Burbage, Mckenleigh L. ([id number]) Tendon Exposed: No Muscle Exposed: No Joint Exposed: No Bone Exposed: No Limited to Skin Breakdown Periwound Skin Texture Texture Color No Abnormalities Noted: No No Abnormalities Noted: No Callus: No Atrophie Blanche: No Crepitus: No Cyanosis: No Excoriation: No Ecchymosis: No Fluctuance: No Erythema: No Friable: No Hemosiderin Staining: No Induration: No Mottled: No Localized Edema: No Pallor: No Rash: No Rubor: No Scarring: No Moisture No Abnormalities Noted: No Dry / Scaly: Yes Maceration: No Moist: Yes Wound Preparation Ulcer Cleansing: Other: soap and water, Topical Anesthetic Applied: None Treatment Notes Wound #1 (Left, Circumferential Lower Leg) 1. Cleansed with: Cleanse wound with antibacterial soap and water 5. Secondary Dressing Applied ABD Pad 7. Secured with 4 Layer Compression System - Bilateral Notes TCA to both lower legs Electronic Signature(s) Signed: 10/24/2015 5:04:52 PM By: Curtis Sites Entered By: Curtis Sites on 10/24/2015 14:45:40 Orourke, Levora Angel ([id number]) -------------------------------------------------------------------------------- Wound Assessment Details Patient Name: Terada, Annaliah L. Date of Service: 10/24/2015 2:15 PM Medical Record Number: [id number] Patient Account Number: [id number] Date of Birth/Sex: [birth year] (75 y.o. Female) Treating RN: Curtis Sites Primary Care Physician: Elizabeth Sauer Other Clinician: Referring Physician: Elizabeth Sauer Treating Physician/Extender: Ardath Sax Weeks in Treatment: 4 Wound Status Wound Number: 2 Primary Lymphedema Etiology: Wound Location: Right Lower Leg - Circumfernential Wound Open Status: Wounding Event: Gradually Appeared Comorbid Cataracts, Deep Vein Thrombosis, Date Acquired: 09/07/2014 History: Hypertension, Peripheral Venous Weeks Of Treatment: 4 Disease Clustered Wound: No Photos Photo Uploaded By:  Curtis Sites on 10/24/2015 16:27:39 Wound Measurements Length: (cm) 10.5 % Reduction in Width: (cm) 15 % Reduction in Depth: (cm) 0.1 Epithelializati Area: (cm) 123.7 Tunneling: Volume: (cm) 12.37 Undermining: Area: 55.1% Volume: 55.1% on: Large (67-100%) No No Wound Description Classification: Partial Thickness Wound Margin: Flat and Intact Exudate Amount: Medium Exudate Type: Serous Exudate Color: amber Foul Odor After Cleansing: No Wound Bed Granulation Amount: Large (67-100%) Exposed Structure Granulation Quality: Red Fascia Exposed: No Necrotic Amount: None Present (0%)  Fat Layer Exposed: No Landing, Jessenya L. ([id number]) Tendon Exposed: No Muscle Exposed: No Joint Exposed: No Bone Exposed: No Limited to Skin Breakdown Periwound Skin Texture Texture Color No Abnormalities Noted: No No Abnormalities Noted: No Callus: No Atrophie Blanche: No Crepitus: No Cyanosis: No Excoriation: No Ecchymosis: No Fluctuance: No Erythema: No Friable: No Hemosiderin Staining: No Induration: No Mottled: No Localized Edema: No Pallor: No Rash: No Rubor: No Scarring: No Moisture No Abnormalities Noted: No Dry / Scaly: Yes Maceration: No Moist: Yes Wound Preparation Ulcer Cleansing: Other: soap and water, Topical Anesthetic Applied: None Treatment Notes Wound #2 (Right, Circumferential Lower Leg) 1. Cleansed with: Cleanse wound with antibacterial soap and water 5. Secondary Dressing Applied ABD Pad 7. Secured with 4 Layer Compression System - Bilateral Notes TCA to both lower legs Electronic Signature(s) Signed: 10/24/2015 5:04:52 PM By: Curtis Sites Entered By: Curtis Sites on 10/24/2015 14:46:12 Goyal, Levora Angel ([id number]) -------------------------------------------------------------------------------- Vitals Details Patient Name: Wittler, Carmaleta L. Date of Service: 10/24/2015 2:15 PM Medical Record Number: [id number] Patient Account Number:  [id number] Date of Birth/Sex: 1940/09/05 (75 y.o. Female) Treating RN: Curtis Sites Primary Care Physician: Elizabeth Sauer Other Clinician: Referring Physician: Elizabeth Sauer Treating Physician/Extender: Ardath Sax Weeks in Treatment: 4 Vital Signs Time Taken: 14:20 Temperature (F): 98.2 Pulse (bpm): 67 Respiratory Rate (breaths/min): 20 Blood Pressure (mmHg): 142/93 Reference Range: 80 - 120 mg / dl Electronic Signature(s) Signed: 10/24/2015 5:04:52 PM By: Curtis Sites Entered By: Curtis Sites on 10/24/2015 14:22:21

## 2015-10-26 DIAGNOSIS — Z87891 Personal history of nicotine dependence: Secondary | ICD-10-CM | POA: Diagnosis not present

## 2015-10-26 DIAGNOSIS — Z9181 History of falling: Secondary | ICD-10-CM | POA: Diagnosis not present

## 2015-10-26 DIAGNOSIS — I272 Other secondary pulmonary hypertension: Secondary | ICD-10-CM | POA: Diagnosis not present

## 2015-10-26 DIAGNOSIS — S8011XD Contusion of right lower leg, subsequent encounter: Secondary | ICD-10-CM | POA: Diagnosis not present

## 2015-10-26 DIAGNOSIS — I4891 Unspecified atrial fibrillation: Secondary | ICD-10-CM | POA: Diagnosis not present

## 2015-10-26 DIAGNOSIS — I110 Hypertensive heart disease with heart failure: Secondary | ICD-10-CM | POA: Diagnosis not present

## 2015-10-26 DIAGNOSIS — D509 Iron deficiency anemia, unspecified: Secondary | ICD-10-CM | POA: Diagnosis not present

## 2015-10-26 DIAGNOSIS — I340 Nonrheumatic mitral (valve) insufficiency: Secondary | ICD-10-CM | POA: Diagnosis not present

## 2015-10-26 DIAGNOSIS — I872 Venous insufficiency (chronic) (peripheral): Secondary | ICD-10-CM | POA: Diagnosis not present

## 2015-10-26 DIAGNOSIS — I5022 Chronic systolic (congestive) heart failure: Secondary | ICD-10-CM | POA: Diagnosis not present

## 2015-10-28 DIAGNOSIS — I5022 Chronic systolic (congestive) heart failure: Secondary | ICD-10-CM | POA: Diagnosis not present

## 2015-10-28 DIAGNOSIS — I872 Venous insufficiency (chronic) (peripheral): Secondary | ICD-10-CM | POA: Diagnosis not present

## 2015-10-28 DIAGNOSIS — D509 Iron deficiency anemia, unspecified: Secondary | ICD-10-CM | POA: Diagnosis not present

## 2015-10-28 DIAGNOSIS — I272 Other secondary pulmonary hypertension: Secondary | ICD-10-CM | POA: Diagnosis not present

## 2015-10-28 DIAGNOSIS — S8011XD Contusion of right lower leg, subsequent encounter: Secondary | ICD-10-CM | POA: Diagnosis not present

## 2015-10-28 DIAGNOSIS — Z9181 History of falling: Secondary | ICD-10-CM | POA: Diagnosis not present

## 2015-10-28 DIAGNOSIS — I4891 Unspecified atrial fibrillation: Secondary | ICD-10-CM | POA: Diagnosis not present

## 2015-10-28 DIAGNOSIS — Z87891 Personal history of nicotine dependence: Secondary | ICD-10-CM | POA: Diagnosis not present

## 2015-10-28 DIAGNOSIS — I340 Nonrheumatic mitral (valve) insufficiency: Secondary | ICD-10-CM | POA: Diagnosis not present

## 2015-10-28 DIAGNOSIS — I110 Hypertensive heart disease with heart failure: Secondary | ICD-10-CM | POA: Diagnosis not present

## 2015-10-31 ENCOUNTER — Encounter: Payer: Medicare Other | Admitting: Surgery

## 2015-10-31 DIAGNOSIS — Z86718 Personal history of other venous thrombosis and embolism: Secondary | ICD-10-CM | POA: Diagnosis not present

## 2015-10-31 DIAGNOSIS — I87032 Postthrombotic syndrome with ulcer and inflammation of left lower extremity: Secondary | ICD-10-CM | POA: Diagnosis not present

## 2015-10-31 DIAGNOSIS — I4891 Unspecified atrial fibrillation: Secondary | ICD-10-CM | POA: Diagnosis not present

## 2015-10-31 DIAGNOSIS — L97821 Non-pressure chronic ulcer of other part of left lower leg limited to breakdown of skin: Secondary | ICD-10-CM | POA: Diagnosis not present

## 2015-10-31 DIAGNOSIS — I872 Venous insufficiency (chronic) (peripheral): Secondary | ICD-10-CM | POA: Diagnosis not present

## 2015-10-31 DIAGNOSIS — I87031 Postthrombotic syndrome with ulcer and inflammation of right lower extremity: Secondary | ICD-10-CM | POA: Diagnosis not present

## 2015-10-31 DIAGNOSIS — Z87891 Personal history of nicotine dependence: Secondary | ICD-10-CM | POA: Diagnosis not present

## 2015-10-31 DIAGNOSIS — I110 Hypertensive heart disease with heart failure: Secondary | ICD-10-CM | POA: Diagnosis not present

## 2015-10-31 DIAGNOSIS — L97212 Non-pressure chronic ulcer of right calf with fat layer exposed: Secondary | ICD-10-CM | POA: Diagnosis not present

## 2015-10-31 DIAGNOSIS — I890 Lymphedema, not elsewhere classified: Secondary | ICD-10-CM | POA: Diagnosis not present

## 2015-10-31 DIAGNOSIS — L97222 Non-pressure chronic ulcer of left calf with fat layer exposed: Secondary | ICD-10-CM | POA: Diagnosis not present

## 2015-10-31 DIAGNOSIS — I5022 Chronic systolic (congestive) heart failure: Secondary | ICD-10-CM | POA: Diagnosis not present

## 2015-10-31 DIAGNOSIS — L97811 Non-pressure chronic ulcer of other part of right lower leg limited to breakdown of skin: Secondary | ICD-10-CM | POA: Diagnosis not present

## 2015-10-31 DIAGNOSIS — I2510 Atherosclerotic heart disease of native coronary artery without angina pectoris: Secondary | ICD-10-CM | POA: Diagnosis not present

## 2015-10-31 DIAGNOSIS — L03115 Cellulitis of right lower limb: Secondary | ICD-10-CM | POA: Diagnosis not present

## 2015-10-31 DIAGNOSIS — L03116 Cellulitis of left lower limb: Secondary | ICD-10-CM | POA: Diagnosis not present

## 2015-10-31 NOTE — Progress Notes (Signed)
[name], Teighan L. ([id number]) Visit Report for 10/31/2015 Arrival Information Details Patient Name: [name], [name] 10/31/2015 12:45 Date of Service: PM Medical Record [id number] Number: Patient Account Number: [id number] July 26, 1940 (75 y.o. Treating RN: Leonard Downingoseboro, Sendra Date of Birth/Sex: Female) Other Clinician: Primary Care Physician: Elizabeth SauerJones, Deanna Treating Britto, Errol Referring Physician: Elizabeth SauerJones, Deanna Physician/Extender: Tania AdeWeeks in Treatment: 5 Visit Information History Since Last Visit All ordered tests and consults were completed: No Patient Arrived: Dan HumphreysWalker Added or deleted any medications: No Arrival Time: 12:44 Any new allergies or adverse reactions: No Accompanied By: self Had a fall or experienced change in No Transfer Assistance: None activities of daily living that may affect Patient Identification Verified: Yes risk of falls: Secondary Verification Process Yes Signs or symptoms of abuse/neglect since last No Completed: visito Patient Has Alerts: Yes Hospitalized since last visit: No Has Dressing in Place as Prescribed: Yes Has Compression in Place as Prescribed: Yes Pain Present Now: No Electronic Signature(s) Signed: 10/31/2015 3:31:28 PM By: Lucrezia Starchoseboro, RN, Sendra Entered By: Lucrezia Starchoseboro, RN, Sendra on 10/31/2015 12:45:22 Cotugno, Levora AngelANNIE L. ([id number]) -------------------------------------------------------------------------------- Encounter Discharge Information Details Patient Name: [name], [name] 10/31/2015 12:45 Date of Service: PM Medical Record [id number] Number: Patient Account Number: [id number] July 26, 1940 (74 y.o. Treating RN: Leonard Downingoseboro, Sendra Date of Birth/Sex: Female) Other Clinician: Primary Care Physician: Elizabeth SauerJones, Deanna Treating Evlyn KannerBritto, Errol Referring Physician: Elizabeth SauerJones, Deanna Physician/Extender: Tania AdeWeeks in Treatment: 5 Encounter Discharge Information Items Facility Notification Discharge Pain Level: 0 Facility Type: Home  Health Discharge Condition: Stable Telephoned: Yes Ambulatory Status: Walker Orders Sent: Yes Discharge Destination: Home Private Transportation: Auto Accompanied By: self Schedule Follow-up Appointment: Yes Medication Reconciliation completed and Yes provided to Patient/Care Rosibel Giacobbe: Clinical Summary of Care: Electronic Signature(s) Signed: 10/31/2015 3:31:28 PM By: Lucrezia Starchoseboro, RN, Sendra Entered By: Lucrezia Starchoseboro, RN, Sendra on 10/31/2015 13:25:14 Fraga, Levora AngelANNIE L. ([id number]) -------------------------------------------------------------------------------- Lower Extremity Assessment Details Patient Name: [name], [name] 10/31/2015 12:45 Date of Service: PM Medical Record [id number] Number: Patient Account Number: [id number] July 26, 1940 (74 y.o. Treating RN: Leonard Downingoseboro, Sendra Date of Birth/Sex: Female) Other Clinician: Primary Care Physician: Elizabeth SauerJones, Deanna Treating Britto, Errol Referring Physician: Elizabeth SauerJones, Deanna Physician/Extender: Tania AdeWeeks in Treatment: 5 Edema Assessment Assessed: [Left: No] [Right: No] Edema: [Left: Yes] [Right: Yes] Calf Left: Right: Point of Measurement: 34 cm From Medial Instep 36 cm 35.5 cm Ankle Left: Right: Point of Measurement: 11 cm From Medial Instep 30 cm 29.7 cm Vascular Assessment Pulses: Posterior Tibial Dorsalis Pedis Palpable: [Left:Yes] [Right:Yes] Extremity colors, hair growth, and conditions: Extremity Color: [Left:Mottled] [Right:Mottled] Hair Growth on Extremity: [Left:No] [Right:No] Temperature of Extremity: [Left:Warm] [Right:Warm] Capillary Refill: [Left:< 3 seconds] [Right:< 3 seconds] Dependent Rubor: [Left:No] [Right:No] Blanched when Elevated: [Left:No] [Right:No] Lipodermatosclerosis: [Left:No] [Right:No] Toe Nail Assessment Left: Right: Thick: No No Discolored: No No Deformed: No No Improper Length and Hygiene: No No Electronic Signature(s) [name], Elliemae L. ([id number]) Signed: 10/31/2015 3:31:28 PM By: Lucrezia Starchoseboro,  RN, Sendra Entered By: Lucrezia Starchoseboro, RN, Sendra on 10/31/2015 12:57:35 Fullard, Levora AngelANNIE L. ([id number]) -------------------------------------------------------------------------------- Pain Assessment Details Patient Name: [name], [name] 10/31/2015 12:45 Date of Service: PM Medical Record [id number] Number: Patient Account Number: [id number] July 26, 1940 (74 y.o. Treating RN: Leonard Downingoseboro, Sendra Date of Birth/Sex: Female) Other Clinician: Primary Care Physician: Elizabeth SauerJones, Deanna Treating Evlyn KannerBritto, Errol Referring Physician: Elizabeth SauerJones, Deanna Physician/Extender: Tania AdeWeeks in Treatment: 5 Active Problems Location of Pain Severity and Description of Pain Patient Has Paino No Site Locations Rate the pain. Current Pain Level: 0 Pain Management and Medication Current Pain Management: Electronic Signature(s) Signed: 10/31/2015 3:31:28 PM By: Lucrezia Starchoseboro, RN, Lennice SitesSendra Entered  By: Lucrezia Starch, RN, Sendra on 10/31/2015 12:45:59 Mccullum, Levora Angel ([id number]) -------------------------------------------------------------------------------- Patient/Caregiver Education Details Patient Name: Marie, [name] 10/31/2015 12:45 Date of Service: PM Medical Record [id number] Number: Patient Account Number: [id number] [birth year] (74 y.o. Treating RN: Leonard Downing Date of Birth/Gender: Female) Other Clinician: Primary Care Physician: Elizabeth Sauer Treating Evlyn Kanner Referring Physician: Elizabeth Sauer Physician/Extender: Tania Ade in Treatment: 5 Education Assessment Education Provided To: Patient Education Topics Provided Venous: Handouts: Other: ordering home wraps Methods: Explain/Verbal Responses: State content correctly Electronic Signature(s) Signed: 10/31/2015 3:31:28 PM By: Lucrezia Starch, RN, Sendra Entered By: Lucrezia Starch RN, Sendra on 10/31/2015 13:25:32 Bucker, Levora Angel ([id number]) -------------------------------------------------------------------------------- Wound Assessment Details Patient Name:  ILEANA, CHALUPA 10/31/2015 12:45 Date of Service: PM Medical Record [id number] Number: Patient Account Number: [id number] 1940-08-20 (74 y.o. Treating RN: Leonard Downing Date of Birth/Sex: Female) Other Clinician: Primary Care Physician: Elizabeth Sauer Treating Britto, Errol Referring Physician: Elizabeth Sauer Physician/Extender: Tania Ade in Treatment: 5 Wound Status Wound Number: 1 Primary Lymphedema Etiology: Wound Location: Left Lower Leg - Circumfernential Wound Open Status: Wounding Event: Gradually Appeared Comorbid Cataracts, Deep Vein Thrombosis, Date Acquired: 09/07/2014 History: Hypertension, Peripheral Venous Weeks Of Treatment: 5 Disease Clustered Wound: No Photos Photo Uploaded By: Lucrezia Starch, RN, Rosalio Macadamia on 10/31/2015 15:27:06 Wound Measurements Length: (cm) 0.1 Width: (cm) 0.1 Depth: (cm) 0.1 Area: (cm) 0.008 Volume: (cm) 0.001 % Reduction in Area: 100% % Reduction in Volume: 100% Epithelialization: Large (67-100%) Tunneling: No Undermining: No Wound Description Classification: Partial Thickness Foul Odor After Wound Margin: Flat and Intact Exudate Amount: Small Exudate Type: Serous Exudate Color: amber Cleansing: No Wound Bed Granulation Amount: Large (67-100%) Exposed Structure Granulation Quality: Red Fascia Exposed: No Collazos, Niesha L. ([id number]) Necrotic Amount: None Present (0%) Fat Layer Exposed: No Tendon Exposed: No Muscle Exposed: No Joint Exposed: No Bone Exposed: No Limited to Skin Breakdown Periwound Skin Texture Texture Color No Abnormalities Noted: No No Abnormalities Noted: No Callus: No Atrophie Blanche: No Crepitus: No Cyanosis: No Excoriation: No Ecchymosis: No Fluctuance: No Erythema: No Friable: No Hemosiderin Staining: No Induration: No Mottled: No Localized Edema: No Pallor: No Rash: No Rubor: No Scarring: No Moisture No Abnormalities Noted: No Dry / Scaly: Yes Maceration: No Moist: No Wound  Preparation Ulcer Cleansing: Other: soap and water, Topical Anesthetic Applied: None Treatment Notes Wound #1 (Left, Circumferential Lower Leg) 1. Cleansed with: Cleanse wound with antibacterial soap and water 3. Peri-wound Care: Moisturizing lotion 5. Secondary Dressing Applied ABD Pad 7. Secured with 4 Layer Compression System - Bilateral Electronic Signature(s) Signed: 10/31/2015 3:31:28 PM By: Lucrezia Starch, RN, Sendra Entered By: Lucrezia Starch RN, Sendra on 10/31/2015 13:03:22 RACQUELLE, HYSER ([id number]) -------------------------------------------------------------------------------- Wound Assessment Details Patient Name: TERRESA, MARLETT 10/31/2015 12:45 Date of Service: PM Medical Record [id number] Number: Patient Account Number: [id number] 1941/01/11 (74 y.o. Treating RN: Leonard Downing Date of Birth/Sex: Female) Other Clinician: Primary Care Physician: Elizabeth Sauer Treating Britto, Errol Referring Physician: Elizabeth Sauer Physician/Extender: Tania Ade in Treatment: 5 Wound Status Wound Number: 2 Primary Lymphedema Etiology: Wound Location: Right Lower Leg - Circumfernential Wound Open Status: Wounding Event: Gradually Appeared Comorbid Cataracts, Deep Vein Thrombosis, Date Acquired: 09/07/2014 History: Hypertension, Peripheral Venous Weeks Of Treatment: 5 Disease Clustered Wound: No Photos Photo Uploaded By: Lucrezia Starch, RN, Rosalio Macadamia on 10/31/2015 15:27:48 Wound Measurements Length: (cm) 0.1 Width: (cm) 0.1 Depth: (cm) 0.1 Area: (cm) 0.008 Volume: (cm) 0.001 % Reduction in Area: 100% % Reduction in Volume: 100% Epithelialization: Large (67-100%) Tunneling: No Undermining: No Wound Description Classification: Partial Thickness Foul Odor After Wound Margin: Flat  and Intact Exudate Amount: Small Exudate Type: Serous Exudate Color: amber Cleansing: No Wound Bed Granulation Amount: Large (67-100%) Exposed Structure Granulation Quality: Red Fascia Exposed:  No Havlin, Zawadi L. ([id number]) Necrotic Amount: None Present (0%) Fat Layer Exposed: No Tendon Exposed: No Muscle Exposed: No Joint Exposed: No Bone Exposed: No Limited to Skin Breakdown Periwound Skin Texture Texture Color No Abnormalities Noted: No No Abnormalities Noted: No Callus: No Atrophie Blanche: No Crepitus: No Cyanosis: No Excoriation: No Ecchymosis: No Fluctuance: No Erythema: No Friable: No Hemosiderin Staining: No Induration: No Mottled: No Localized Edema: No Pallor: No Rash: No Rubor: No Scarring: No Moisture No Abnormalities Noted: No Dry / Scaly: Yes Maceration: No Moist: No Wound Preparation Ulcer Cleansing: Other: soap and water, Topical Anesthetic Applied: None Treatment Notes Wound #2 (Right, Circumferential Lower Leg) 1. Cleansed with: Cleanse wound with antibacterial soap and water 3. Peri-wound Care: Moisturizing lotion 5. Secondary Dressing Applied ABD Pad 7. Secured with 4 Layer Compression System - Bilateral Electronic Signature(s) Signed: 10/31/2015 3:31:28 PM By: Lucrezia Starch, RN, Sendra Entered By: Lucrezia Starch RN, Sendra on 10/31/2015 13:03:35 Ehresman, Levora Angel ([id number]) -------------------------------------------------------------------------------- Vitals Details Patient Name: LYNANNE, DELGRECO 10/31/2015 12:45 Date of Service: PM Medical Record [id number] Number: Patient Account Number: [id number] [birth year] (74 y.o. Treating RN: Leonard Downing Date of Birth/Sex: Female) Other Clinician: Primary Care Physician: Elizabeth Sauer Treating Britto, Errol Referring Physician: Elizabeth Sauer Physician/Extender: Tania Ade in Treatment: 5 Vital Signs Time Taken: 12:46 Temperature (F): 97.5 Pulse (bpm): 88 Blood Pressure (mmHg): 126/74 Reference Range: 80 - 120 mg / dl Electronic Signature(s) Signed: 10/31/2015 3:31:28 PM By: Lucrezia Starch RN, Sendra Entered By: Lucrezia Starch RN, Sendra on 10/31/2015 12:46:15

## 2015-10-31 NOTE — Progress Notes (Addendum)
[name], [name] ([id number]) Visit Report for 10/31/2015 Chief Complaint Document Details Patient Name: Marie, [name] 10/31/2015 12:45 Date of Service: PM Medical Record [id number] Number: Patient Account Number: [id number] 06/24/1941 (75 y.o. Treating RN: Leonard Downing Date of Birth/Sex: Female) Other Clinician: Primary Care Physician: Elizabeth Sauer Treating Evlyn Kanner Referring Physician: Elizabeth Sauer Physician/Extender: Tania Ade in Treatment: 5 Information Obtained from: Patient Chief Complaint Patient presents for treatment of an open ulcer due to venous insufficiency which has been continuing this way for several months Electronic Signature(s) Signed: 10/31/2015 1:06:02 PM By: Evlyn Kanner MD, FACS Entered By: Evlyn Kanner on 10/31/2015 13:06:02 [name], [name] ([id number]) -------------------------------------------------------------------------------- HPI Details Patient Name: [name], Marie 10/31/2015 12:45 Date of Service: PM Medical Record [id number] Number: Patient Account Number: [id number] 07-Dec-1940 (74 y.o. Treating RN: Leonard Downing Date of Birth/Sex: Female) Other Clinician: Primary Care Physician: Elizabeth Sauer Treating Evlyn Kanner Referring Physician: Elizabeth Sauer Physician/Extender: Tania Ade in Treatment: 5 History of Present Illness Location: bilateral lower extremity wounds Quality: Patient reports experiencing heaviness to affected area(s). Severity: Patient states wound are getting worse. Duration: Patient has had the wound for > 8 months prior to seeking treatment at the wound center Timing: Pain in wound is Intermittent (comes and goes Context: The wound would happen gradually Modifying Factors: Other treatment(s) tried include:Unna's boots and treatment by Dr. Gilda Crease at the vascular surgery office Associated Signs and Symptoms: Patient reports having increase discharge. HPI Description: 75 year old patient who is [name] but most of the history is being obtained from Dr. Levora Dredge her vascular surgeon who has been treating her for about 8 months to [name] year. the patient has [name] postphlebitic syndrome both lower extremities and has been under his care for [name] while. The ABIs done in 2013 were normal and he does not suspect any vascular arterial compromise as the resting ABI on the right was 1.03 on the left was 1.02. An ultrasound of the lower extremity bilaterally did not reveal any DVT or superficial thrombophlebitis bilaterally. Patient's initial problem started when she had [name] hematoma for right lower extremity due to being on Coumadin and then developed [name] lot of swelling and has been having Unna's boots for [name] long while. However recently her left lower extremity started swelling and oozing profusely and the patient did not want to have Unna's boots applied and hence Dr. Gilda Crease has referred her to [place] for possible failure of 4 layer wraps with Profore. past medical history does not include diabetes mellitus but she has [name] cardiac issue and has had some element of CHF. She has been treated with lymphedema pumps in the past. 10/03/2015 -- the patient's lower extremities have been weeping very significantly and she has had [name] lot of swelling and redness since the last few days. She was seen by Dr. Gilda Crease this morning and he had recommended admission to the hospital but the patient was unwilling to have that done and came here for [name] second opinion. 10/10/2015 -- was admitted to the hospital March 27 to 10/07/2015 for bilateral cellulitis of lower extremities and also had chronic [name]. fib, chronic systolic heart failure, venous insufficiency of the legs and coronary artery disease. She was given 5 days of vancomycin and ampicillin and sulbactam. She was seen by Dr. Wyn Quaker and while she was inpatient and he recommended continuing with compression and oral antibiotics ( Augmentin ) at least for 2  weeks. 10/31/2015 -- her home health nurses have not yet ordered her juxta lites stockings  and we will re-order them and talk to them if they have any questions [name], [name] ([id number]) Electronic Signature(s) Signed: 10/31/2015 1:06:30 PM By: Evlyn Kanner MD, FACS Entered By: Evlyn Kanner on 10/31/2015 13:06:30 Marie, [name] ([id number]) -------------------------------------------------------------------------------- Physical Exam Details Patient Name: Marie, [name] 10/31/2015 12:45 Date of Service: PM Medical Record [id number] Number: Patient Account Number: [id number] [birth year] (74 y.o. Treating RN: Leonard Downing Date of Birth/Sex: Female) Other Clinician: Primary Care Physician: Elizabeth Sauer Treating Evlyn Kanner Referring Physician: Elizabeth Sauer Physician/Extender: Tania Ade in Treatment: 5 Constitutional . Pulse regular. Respirations normal and unlabored. Afebrile. . Eyes Nonicteric. Reactive to light. Ears, Nose, Mouth, and Throat Lips, teeth, and gums WNL.Marland Kitchen Moist mucosa without lesions. Neck supple and nontender. No palpable supraclavicular or cervical adenopathy. Normal sized without goiter. Respiratory WNL. No retractions.. Cardiovascular Pedal Pulses WNL. No clubbing, cyanosis or edema. Lymphatic No adneopathy. No adenopathy. No adenopathy. Musculoskeletal Adexa without tenderness or enlargement.. Digits and nails w/o clubbing, cyanosis, infection, petechiae, ischemia, or inflammatory conditions.. Integumentary (Hair, Skin) No suspicious lesions. No crepitus or fluctuance. No peri-wound warmth or erythema. No masses.Marland Kitchen Psychiatric Judgement and insight Intact.. No evidence of depression, anxiety, or agitation.. Notes the lymphedema has gone down markedly and there is no evidence of cellulitis or inflammation. She has no open ulcerations except for minimal scaly skin but because she does not have [name] compression stockings yet we will have to  continue with [name] 4-layer Profore. Electronic Signature(s) Signed: 10/31/2015 1:08:03 PM By: Evlyn Kanner MD, FACS Entered By: Evlyn Kanner on 10/31/2015 13:08:02 [name], [name] ([id number]) -------------------------------------------------------------------------------- Physician Orders Details Patient Name: [name], [name] 10/31/2015 12:45 Date of Service: PM Medical Record [id number] Number: Patient Account Number: [id number] February 03, 1941 (74 y.o. Treating RN: Leonard Downing Date of Birth/Sex: Female) Other Clinician: Primary Care Physician: Elizabeth Sauer Treating Evlyn Kanner Referring Physician: Elizabeth Sauer Physician/Extender: Tania Ade in Treatment: 5 Verbal / Phone Orders: Yes Clinician: Leonard Downing Read Back and Verified: Yes Diagnosis Coding Wound Cleansing Wound #1 Left,Circumferential Lower Leg o May shower with protection. o No tub bath. Wound #2 Right,Circumferential Lower Leg o May shower with protection. o No tub bath. Skin Barriers/Peri-Wound Care Wound #1 Left,Circumferential Lower Leg o Moisturizing lotion Wound #2 Right,Circumferential Lower Leg o Moisturizing lotion Primary Wound Dressing Wound #1 Left,Circumferential Lower Leg o ABD Pad Wound #2 Right,Circumferential Lower Leg o ABD Pad Dressing Change Frequency Wound #1 Left,Circumferential Lower Leg o Dressing is to be changed Monday and Thursday. Wound #2 Right,Circumferential Lower Leg o Dressing is to be changed Monday and Thursday. Follow-up Appointments Wound #1 Left,Circumferential Lower Leg o Return Appointment in 1 week. [name], [name] ([id number]) Wound #2 Right,Circumferential Lower Leg o Return Appointment in 1 week. Edema Control Wound #1 Left,Circumferential Lower Leg o 4 Layer Compression System - Bilateral o Other: - have HH order Juxta lite wraps Wound #2 Right,Circumferential Lower Leg o 4 Layer Compression System - Bilateral - have  HH order Juxta lite wraps o Other: - have HH order Juxzo lite wraps Additional Orders / Instructions Wound #1 Left,Circumferential Lower Leg o Increase protein intake. Wound #2 Right,Circumferential Lower Leg o Increase protein intake. Home Health Wound #1 Left,Circumferential Lower Leg o Continue Home Health Visits o Home Health Nurse may visit PRN to address patientos wound care needs. o FACE TO FACE ENCOUNTER: MEDICARE and MEDICAID PATIENTS: I certify that this patient is under my care and that I had [name] face-to-face encounter that meets the physician face-to-face encounter requirements with this patient  on this date. The encounter with the patient was in whole or in part for the following MEDICAL CONDITION: (primary reason for Home Healthcare) MEDICAL NECESSITY: I certify, that based on my findings, NURSING services are [name] medically necessary home health service. HOME BOUND STATUS: I certify that my clinical findings support that this patient is homebound (i.e., Due to illness or injury, pt requires aid of supportive devices such as crutches, cane, wheelchairs, walkers, the use of special transportation or the assistance of another person to leave their place of residence. There is [name] normal inability to leave the home and doing so requires considerable and taxing effort. Other absences are for medical reasons / religious services and are infrequent or of short duration when for other reasons). o If current dressing causes regression in wound condition, may D/C ordered dressing product/s and apply Normal Saline Moist Dressing daily until next Wound Healing Center / Other MD appointment. Notify Wound Healing Center of regression in wound condition at [phone]. o Please direct any NON-WOUND related issues/requests for orders to patient's Primary Care Physician Wound #2 Right,Circumferential Lower Leg o Continue Home Health Visits o Home Health Nurse may visit PRN  to address patientos wound care needs. o FACE TO FACE ENCOUNTER: MEDICARE and MEDICAID PATIENTS: I certify that this patient is under my care and that I had [name] face-to-face encounter that meets the physician face-to-face encounter requirements with this patient on this date. The encounter with the patient was in MATRICE, HERRO. ([id number]) whole or in part for the following MEDICAL CONDITION: (primary reason for Home Healthcare) MEDICAL NECESSITY: I certify, that based on my findings, NURSING services are [name] medically necessary home health service. HOME BOUND STATUS: I certify that my clinical findings support that this patient is homebound (i.e., Due to illness or injury, pt requires aid of supportive devices such as crutches, cane, wheelchairs, walkers, the use of special transportation or the assistance of another person to leave their place of residence. There is [name] normal inability to leave the home and doing so requires considerable and taxing effort. Other absences are for medical reasons / religious services and are infrequent or of short duration when for other reasons). o If current dressing causes regression in wound condition, may D/C ordered dressing product/s and apply Normal Saline Moist Dressing daily until next Wound Healing Center / Other MD appointment. Notify Wound Healing Center of regression in wound condition at [phone]. o Please direct any NON-WOUND related issues/requests for orders to patient's Primary Care Physician Electronic Signature(s) Signed: 10/31/2015 3:31:28 PM By: Maureen Chatters Signed: 10/31/2015 3:39:07 PM By: Evlyn Kanner MD, FACS Entered By: Lucrezia Starch RN, Sendra on 10/31/2015 14:54:48 [name], [name] ([id number]) -------------------------------------------------------------------------------- Problem List Details Patient Name: LYNETT, BRASIL 10/31/2015 12:45 Date of Service: PM Medical Record [id number] Number: Patient Account  Number: [id number] 1941/04/22 (74 y.o. 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 09/26/2015 Yes exposed E66.3 Overweight 09/26/2015 Yes L03.116 Cellulitis of left lower limb 10/03/2015 Yes L03.115 Cellulitis of right lower limb 10/03/2015 Yes Inactive Problems Resolved  Problems Electronic Signature(s) Signed: 10/31/2015 1:05:48 PM By: Evlyn Kanner MD, FACS [name], [name] ([id number]) Entered By: Evlyn Kanner on 10/31/2015 13:05:47 [name], [name] ([id number]) -------------------------------------------------------------------------------- Progress Note Details Patient Name: [name], [name] 10/31/2015 12:45 Date of Service: PM Medical Record [id number] Number: Patient Account Number: [id number] 1940/08/06 (74 y.o. Treating RN: Leonard Downing Date of Birth/Sex: Female) Other Clinician: Primary Care Physician: Elizabeth Sauer Treating Evlyn Kanner Referring Physician: Elizabeth Sauer Physician/Extender: Tania Ade in Treatment: 5 Subjective Chief Complaint Information obtained from Patient Patient presents for treatment of an open ulcer due to venous insufficiency which has been continuing this way for several months History of Present Illness (HPI) The following HPI elements were documented for the patient's wound: Location: bilateral lower extremity wounds Quality: Patient reports  experiencing heaviness to affected area(s). Severity: Patient states wound are getting worse. Duration: Patient has had the wound for > 8 months prior to seeking treatment at the wound center Timing: Pain in wound is Intermittent (comes and goes Context: The wound would happen gradually Modifying Factors: Other treatment(s) tried include:Unna's boots and treatment by Dr. Gilda Crease at the vascular surgery office Associated Signs and Symptoms: Patient reports having increase discharge. 75 year old patient who is [name] poor historian but most of the history is being obtained from Dr. Levora Dredge her vascular surgeon who has been treating her for about 8 months to [name] year. the patient has [name] postphlebitic syndrome both lower extremities and has been under his care for [name] while. The ABIs done in 2013 were normal and he does not suspect any vascular arterial compromise as the resting ABI on the right was 1.03 on the left was 1.02. An ultrasound of the lower extremity bilaterally did not reveal any DVT or superficial thrombophlebitis bilaterally. Patient's initial problem started when she had [name] hematoma for right lower extremity due to being on Coumadin and then developed [name] lot of swelling and has been having Unna's boots for [name] long while. However recently her left lower extremity started swelling and oozing profusely and the patient did not want to have Unna's boots applied and hence Dr. Gilda Crease has referred her to [place] for possible failure of 4 layer wraps with Profore. past medical history does not include diabetes mellitus but she has [name] cardiac issue and has had some element of CHF. She has been treated with lymphedema pumps in the past. 10/03/2015 -- the patient's lower extremities have been weeping very significantly and she has had [name] lot of swelling and redness since the last few days. She was seen by Dr. Gilda Crease this morning and he had recommended admission to the hospital but the patient was  unwilling to have that done and came here for [name] second opinion. [name], [name] ([id number]) 10/10/2015 -- was admitted to the hospital March 27 to 10/07/2015 for bilateral cellulitis of lower extremities and also had chronic [name]. fib, chronic systolic heart failure, venous insufficiency of the legs and coronary artery disease. She was given 5 days of vancomycin and ampicillin and sulbactam. She was seen by Dr. Wyn Quaker and while she was inpatient and he recommended continuing with compression and oral antibiotics ( Augmentin ) at least for 2 weeks. 10/31/2015 -- her home health nurses have not yet ordered her juxta lites stockings and we will re-order them and talk to them if they have any questions Objective Constitutional Pulse regular. Respirations normal and unlabored. Afebrile. Vitals Time Taken: 12:46 PM, Temperature: 97.5 F, Pulse: 88 bpm, Blood Pressure: 126/74 mmHg. Eyes Nonicteric. Reactive  to light. Ears, Nose, Mouth, and Throat Lips, teeth, and gums WNL.Marland Kitchen Moist mucosa without lesions. Neck supple and nontender. No palpable supraclavicular or cervical adenopathy. Normal sized without goiter. Respiratory WNL. No retractions.. Cardiovascular Pedal Pulses WNL. No clubbing, cyanosis or edema. Lymphatic No adneopathy. No adenopathy. No adenopathy. Musculoskeletal Adexa without tenderness or enlargement.. Digits and nails w/o clubbing, cyanosis, infection, petechiae, ischemia, or inflammatory conditions.Marland Kitchen Psychiatric Judgement and insight Intact.. No evidence of depression, anxiety, or agitation.. General Notes: the lymphedema has gone down markedly and there is no evidence of cellulitis or inflammation. She has no open ulcerations except for minimal scaly skin but because she does not have [name] compression stockings yet we will have to continue with [name] 4-layer Profore. [name], [name] ([id number]) Integumentary (Hair, Skin) No suspicious lesions. No crepitus or fluctuance. No  peri-wound warmth or erythema. No masses.. Wound #1 status is Open. Original cause of wound was Gradually Appeared. The wound is located on the Left,Circumferential Lower Leg. The wound measures 0.1cm length x 0.1cm width x 0.1cm depth; 0.008cm^2 area and 0.001cm^3 volume. The wound is limited to skin breakdown. There is no tunneling or undermining noted. There is [name] small amount of serous drainage noted. The wound margin is flat and intact. There is large (67-100%) red granulation within the wound bed. There is no necrotic tissue within the wound bed. The periwound skin appearance exhibited: Dry/Scaly. The periwound skin appearance did not exhibit: Callus, Crepitus, Excoriation, Fluctuance, Friable, Induration, Localized Edema, Rash, Scarring, Maceration, Moist, Atrophie Blanche, Cyanosis, Ecchymosis, Hemosiderin Staining, Mottled, Pallor, Rubor, Erythema. Wound #2 status is Open. Original cause of wound was Gradually Appeared. The wound is located on the Right,Circumferential Lower Leg. The wound measures 0.1cm length x 0.1cm width x 0.1cm depth; 0.008cm^2 area and 0.001cm^3 volume. The wound is limited to skin breakdown. There is no tunneling or undermining noted. There is [name] small amount of serous drainage noted. The wound margin is flat and intact. There is large (67-100%) red granulation within the wound bed. There is no necrotic tissue within the wound bed. The periwound skin appearance exhibited: Dry/Scaly. The periwound skin appearance did not exhibit: Callus, Crepitus, Excoriation, Fluctuance, Friable, Induration, Localized Edema, Rash, Scarring, Maceration, Moist, Atrophie Blanche, Cyanosis, Ecchymosis, Hemosiderin Staining, Mottled, Pallor, Rubor, Erythema. Assessment Active Problems ICD-10 I87.031 - Postthrombotic syndrome with ulcer and inflammation of right lower extremity I87.032 - Postthrombotic syndrome with ulcer and inflammation of left lower extremity L97.222 - Non-pressure  chronic ulcer of left calf with fat layer exposed L97.212 - Non-pressure chronic ulcer of right calf with fat layer exposed E66.3 - Overweight L03.116 - Cellulitis of left lower limb L03.115 - Cellulitis of right lower limb Plan Wound Cleansing: Wound #1 Left,Circumferential Lower Leg: May shower with protection. Smolenski, Kima L. ([id number]) No tub bath. Wound #2 Right,Circumferential Lower Leg: May shower with protection. No tub bath. Skin Barriers/Peri-Wound Care: Wound #1 Left,Circumferential Lower Leg: Moisturizing lotion Wound #2 Right,Circumferential Lower Leg: Moisturizing lotion Primary Wound Dressing: Wound #1 Left,Circumferential Lower Leg: ABD Pad Wound #2 Right,Circumferential Lower Leg: ABD Pad Dressing Change Frequency: Wound #1 Left,Circumferential Lower Leg: Dressing is to be changed Monday and Thursday. Wound #2 Right,Circumferential Lower Leg: Dressing is to be changed Monday and Thursday. Follow-up Appointments: Wound #1 Left,Circumferential Lower Leg: Return Appointment in 1 week. Wound #2 Right,Circumferential Lower Leg: Return Appointment in 1 week. Edema Control: Wound #1 Left,Circumferential Lower Leg: 4 Layer Compression System - Bilateral Other: - have HH order Juxta lite wraps Wound #2 Right,Circumferential  Lower Leg: 4 Layer Compression System - Bilateral - have HH order Juxta lite wraps Other: - have HH order Juxzo lite wraps Additional Orders / Instructions: Wound #1 Left,Circumferential Lower Leg: Increase protein intake. Wound #2 Right,Circumferential Lower Leg: Increase protein intake. Home Health: Wound #1 Left,Circumferential Lower Leg: Continue Home Health Visits Home Health Nurse may visit PRN to address patient s wound care needs. FACE TO FACE ENCOUNTER: MEDICARE and MEDICAID PATIENTS: I certify that this patient is under my care and that I had [name] face-to-face encounter that meets the physician face-to-face  encounter requirements with this patient on this date. The encounter with the patient was in whole or in part for the following MEDICAL CONDITION: (primary reason for Home Healthcare) MEDICAL NECESSITY: I certify, that based on my findings, NURSING services are [name] medically necessary home health service. HOME BOUND STATUS: I certify that my clinical findings support that this patient is homebound (i.e., Due to illness or injury, pt requires aid of supportive devices such as crutches, cane, wheelchairs, walkers, the use of special transportation or the assistance of another person to leave their place of residence. There is [name] normal inability to leave the home and doing so requires considerable and taxing effort. Other absences are for medical reasons / religious services and are infrequent or of short duration when for other reasons). If current dressing causes regression in wound condition, may D/C ordered dressing product/s and apply Haub, Malana L. ([id number]) Normal Saline Moist Dressing daily until next Wound Healing Center / Other MD appointment. Notify Wound Healing Center of regression in wound condition at [phone]. Please direct any NON-WOUND related issues/requests for orders to patient's Primary Care Physician Wound #2 Right,Circumferential Lower Leg: Continue Home Health Visits Home Health Nurse may visit PRN to address patient s wound care needs. FACE TO FACE ENCOUNTER: MEDICARE and MEDICAID PATIENTS: I certify that this patient is under my care and that I had [name] face-to-face encounter that meets the physician face-to-face encounter requirements with this patient on this date. The encounter with the patient was in whole or in part for the following MEDICAL CONDITION: (primary reason for Home Healthcare) MEDICAL NECESSITY: I certify, that based on my findings, NURSING services are [name] medically necessary home health service. HOME BOUND STATUS: I certify that my clinical findings  support that this patient is homebound (i.e., Due to illness or injury, pt requires aid of supportive devices such as crutches, cane, wheelchairs, walkers, the use of special transportation or the assistance of another person to leave their place of residence. There is [name] normal inability to leave the home and doing so requires considerable and taxing effort. Other absences are for medical reasons / religious services and are infrequent or of short duration when for other reasons). If current dressing causes regression in wound condition, may D/C ordered dressing product/s and apply Normal Saline Moist Dressing daily until next [facility]aling Center / Other MD appointment. Notify [facility] of regression in wound condition at [phone]. Please direct any NON-WOUND related issues/requests for orders to patient's Primary Care Physician We will continue to use [name] 4-layer Profore wrap and this will be changed on Friday by home health. If she looks good and we can possibly put her in [name] compression stockings and continue with elevation and exercise. I have ordered her juxta light compression stockings and hopefully she can bring them with her next week Electronic Signature(s) Signed: 10/31/2015 4:02:04 PM By: Evlyn Kanner MD, FACS Previous Signature: 10/31/2015 1:08:20 PM  Version By: Evlyn KannerBritto, Dillin Lofgren MD, FACS Entered By: Evlyn KannerBritto, Fillmore Bynum on 10/31/2015 16:02:04 Kobel, [name] ([id number]) -------------------------------------------------------------------------------- SuperBill Details Patient Name: [name], Dvora L. Date of Service: 10/31/2015 Medical Record Number: [id number] Patient Account Number: [id number] Date of Birth/Sex: [birth year] (75 y.o. 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 ([id number]) Entered By: Maureen [place], RN, Sendra on 10/31/2015 14:43:16

## 2015-11-02 DIAGNOSIS — I110 Hypertensive heart disease with heart failure: Secondary | ICD-10-CM | POA: Diagnosis not present

## 2015-11-02 DIAGNOSIS — I4891 Unspecified atrial fibrillation: Secondary | ICD-10-CM | POA: Diagnosis not present

## 2015-11-02 DIAGNOSIS — I272 Other secondary pulmonary hypertension: Secondary | ICD-10-CM | POA: Diagnosis not present

## 2015-11-02 DIAGNOSIS — Z87891 Personal history of nicotine dependence: Secondary | ICD-10-CM | POA: Diagnosis not present

## 2015-11-02 DIAGNOSIS — I340 Nonrheumatic mitral (valve) insufficiency: Secondary | ICD-10-CM | POA: Diagnosis not present

## 2015-11-02 DIAGNOSIS — I5022 Chronic systolic (congestive) heart failure: Secondary | ICD-10-CM | POA: Diagnosis not present

## 2015-11-02 DIAGNOSIS — Z9181 History of falling: Secondary | ICD-10-CM | POA: Diagnosis not present

## 2015-11-02 DIAGNOSIS — D509 Iron deficiency anemia, unspecified: Secondary | ICD-10-CM | POA: Diagnosis not present

## 2015-11-02 DIAGNOSIS — I872 Venous insufficiency (chronic) (peripheral): Secondary | ICD-10-CM | POA: Diagnosis not present

## 2015-11-02 DIAGNOSIS — S8011XD Contusion of right lower leg, subsequent encounter: Secondary | ICD-10-CM | POA: Diagnosis not present

## 2015-11-03 DIAGNOSIS — E782 Mixed hyperlipidemia: Secondary | ICD-10-CM | POA: Diagnosis not present

## 2015-11-03 DIAGNOSIS — I5022 Chronic systolic (congestive) heart failure: Secondary | ICD-10-CM | POA: Diagnosis not present

## 2015-11-03 DIAGNOSIS — I340 Nonrheumatic mitral (valve) insufficiency: Secondary | ICD-10-CM | POA: Diagnosis not present

## 2015-11-03 DIAGNOSIS — I482 Chronic atrial fibrillation: Secondary | ICD-10-CM | POA: Diagnosis not present

## 2015-11-03 DIAGNOSIS — I272 Other secondary pulmonary hypertension: Secondary | ICD-10-CM | POA: Diagnosis not present

## 2015-11-04 DIAGNOSIS — Z9181 History of falling: Secondary | ICD-10-CM | POA: Diagnosis not present

## 2015-11-04 DIAGNOSIS — I110 Hypertensive heart disease with heart failure: Secondary | ICD-10-CM | POA: Diagnosis not present

## 2015-11-04 DIAGNOSIS — S8011XD Contusion of right lower leg, subsequent encounter: Secondary | ICD-10-CM | POA: Diagnosis not present

## 2015-11-04 DIAGNOSIS — I340 Nonrheumatic mitral (valve) insufficiency: Secondary | ICD-10-CM | POA: Diagnosis not present

## 2015-11-04 DIAGNOSIS — I4891 Unspecified atrial fibrillation: Secondary | ICD-10-CM | POA: Diagnosis not present

## 2015-11-04 DIAGNOSIS — I872 Venous insufficiency (chronic) (peripheral): Secondary | ICD-10-CM | POA: Diagnosis not present

## 2015-11-04 DIAGNOSIS — I272 Other secondary pulmonary hypertension: Secondary | ICD-10-CM | POA: Diagnosis not present

## 2015-11-04 DIAGNOSIS — I5022 Chronic systolic (congestive) heart failure: Secondary | ICD-10-CM | POA: Diagnosis not present

## 2015-11-04 DIAGNOSIS — Z87891 Personal history of nicotine dependence: Secondary | ICD-10-CM | POA: Diagnosis not present

## 2015-11-04 DIAGNOSIS — D509 Iron deficiency anemia, unspecified: Secondary | ICD-10-CM | POA: Diagnosis not present

## 2015-11-07 ENCOUNTER — Encounter: Payer: Medicare Other | Attending: Surgery | Admitting: Surgery

## 2015-11-07 DIAGNOSIS — I890 Lymphedema, not elsewhere classified: Secondary | ICD-10-CM | POA: Diagnosis not present

## 2015-11-07 DIAGNOSIS — L03115 Cellulitis of right lower limb: Secondary | ICD-10-CM | POA: Diagnosis not present

## 2015-11-07 DIAGNOSIS — I872 Venous insufficiency (chronic) (peripheral): Secondary | ICD-10-CM | POA: Diagnosis not present

## 2015-11-07 DIAGNOSIS — Z09 Encounter for follow-up examination after completed treatment for conditions other than malignant neoplasm: Secondary | ICD-10-CM | POA: Diagnosis not present

## 2015-11-07 DIAGNOSIS — I5022 Chronic systolic (congestive) heart failure: Secondary | ICD-10-CM | POA: Diagnosis not present

## 2015-11-07 DIAGNOSIS — I2510 Atherosclerotic heart disease of native coronary artery without angina pectoris: Secondary | ICD-10-CM | POA: Insufficient documentation

## 2015-11-07 DIAGNOSIS — I87032 Postthrombotic syndrome with ulcer and inflammation of left lower extremity: Secondary | ICD-10-CM | POA: Diagnosis not present

## 2015-11-07 DIAGNOSIS — Z86718 Personal history of other venous thrombosis and embolism: Secondary | ICD-10-CM | POA: Insufficient documentation

## 2015-11-07 DIAGNOSIS — Z87891 Personal history of nicotine dependence: Secondary | ICD-10-CM | POA: Diagnosis not present

## 2015-11-07 DIAGNOSIS — L97212 Non-pressure chronic ulcer of right calf with fat layer exposed: Secondary | ICD-10-CM | POA: Insufficient documentation

## 2015-11-07 DIAGNOSIS — I110 Hypertensive heart disease with heart failure: Secondary | ICD-10-CM | POA: Diagnosis not present

## 2015-11-07 DIAGNOSIS — L97222 Non-pressure chronic ulcer of left calf with fat layer exposed: Secondary | ICD-10-CM | POA: Insufficient documentation

## 2015-11-07 DIAGNOSIS — L03116 Cellulitis of left lower limb: Secondary | ICD-10-CM | POA: Insufficient documentation

## 2015-11-07 DIAGNOSIS — I4891 Unspecified atrial fibrillation: Secondary | ICD-10-CM | POA: Insufficient documentation

## 2015-11-07 DIAGNOSIS — E663 Overweight: Secondary | ICD-10-CM | POA: Insufficient documentation

## 2015-11-07 DIAGNOSIS — Z872 Personal history of diseases of the skin and subcutaneous tissue: Secondary | ICD-10-CM | POA: Diagnosis not present

## 2015-11-07 DIAGNOSIS — I87031 Postthrombotic syndrome with ulcer and inflammation of right lower extremity: Secondary | ICD-10-CM | POA: Insufficient documentation

## 2015-11-07 NOTE — Progress Notes (Signed)
Marie, [name] ([id number]) Visit Report for 11/07/2015 Arrival Information Details Patient Name: [name], [name]. Date of Service: 11/07/2015 1:45 PM Medical Record Number: [id number] Patient Account Number: [id number] Date of Birth/Sex: [birth year] (75 y.o. Female) Treating RN: Huel Coventry Primary Care Physician: Elizabeth Sauer Other Clinician: Referring Physician: Elizabeth Sauer Treating Physician/Extender: Rudene Re in Treatment: 6 Visit Information History Since Last Visit Added or deleted any medications: No Patient Arrived: [name] Had a fall or experienced change in No Arrival Time: 13:26 activities of daily living that may affect Accompanied By: self risk of falls: Transfer Assistance: None Hospitalized since last visit: No Patient Identification Verified: Yes Has Dressing in Place as Prescribed: Yes Secondary Verification Process Completed: Yes Pain Present Now: No Patient Has Alerts: Yes Electronic Signature(s) Signed: 11/07/2015 2:37:38 PM By: Elliot Gurney, RN, BSN, Kim RN, BSN Entered By: Elliot Gurney, RN, BSN, Kim on 11/07/2015 13:27:06 Dado, Levora Angel ([id number]) -------------------------------------------------------------------------------- Clinic Level of Care Assessment Details Patient Name: Marie, Vennessa L. Date of Service: 11/07/2015 1:45 PM Medical Record Number: [id number] Patient Account Number: [id number] Date of Birth/Sex: 1941-01-20 (75 y.o. Female) Treating RN: Leonard Downing Primary Care Physician: Elizabeth Sauer Other Clinician: Referring Physician: Elizabeth Sauer Treating Physician/Extender: Rudene Re in Treatment: 6 Clinic Level of Care Assessment Items TOOL 4 Quantity Score X - Use when only an EandM is performed on FOLLOW-UP visit 1 0 ASSESSMENTS - Nursing Assessment / Reassessment X - Reassessment of Co-morbidities (includes updates in patient status) 1 10 X - Reassessment of Adherence to Treatment Plan 1 5 ASSESSMENTS - Wound and Skin  Assessment / Reassessment  - Simple Wound Assessment / Reassessment - one wound 0 X - Complex Wound Assessment / Reassessment - multiple wounds 2 5  - Dermatologic / Skin Assessment (not related to wound area) 0 ASSESSMENTS - Focused Assessment X - Circumferential Edema Measurements - multi extremities 2 5  - Nutritional Assessment / Counseling / Intervention 0 X - Lower Extremity Assessment (monofilament, tuning fork, pulses) 1 5  - Peripheral Arterial Disease Assessment (using hand held doppler) 0 ASSESSMENTS - Ostomy and/or Continence Assessment and Care  - Incontinence Assessment and Management 0  - Ostomy Care Assessment and Management (repouching, etc.) 0 PROCESS - Coordination of Care X - Simple Patient / Family Education for ongoing care 1 15  - Complex (extensive) Patient / Family Education for ongoing care 0 X - Staff obtains Chiropractor, Records, Test Results / Process Orders 1 10 X - Staff telephones HHA, Nursing Homes / Clarify orders / etc 1 10  - Routine Transfer to another Facility (non-emergent condition) 0 Witt, Karleigh L. ([id number])  - Routine Hospital Admission (non-emergent condition) 0  - New Admissions / Manufacturing engineer / Ordering NPWT, Apligraf, etc. 0  - Emergency Hospital Admission (emergent condition) 0 X - Simple Discharge Coordination 1 10  - Complex (extensive) Discharge Coordination 0 PROCESS - Special Needs  - Pediatric / Minor Patient Management 0  - Isolation Patient Management 0  - Hearing / Language / Visual special needs 0  - Assessment of Community assistance (transportation, D/C planning, etc.) 0  - Additional assistance / Altered mentation 0  - Support Surface(s) Assessment (bed, cushion, seat, etc.) 0 INTERVENTIONS - Wound Cleansing / Measurement  - Simple Wound Cleansing - one wound 0 X - Complex Wound Cleansing - multiple wounds 2 5 X - Wound Imaging (photographs - any number of wounds) 1 5   - Wound Tracing (instead of photographs) 0  - Simple Wound Measurement -  one wound 0 X - Complex Wound Measurement - multiple wounds 2 5 INTERVENTIONS - Wound Dressings X - Small Wound Dressing one or multiple wounds 2 10 []  - Medium Wound Dressing one or multiple wounds 0 []  - Large Wound Dressing one or multiple wounds 0 []  - Application of Medications - topical 0 []  - Application of Medications - injection 0 INTERVENTIONS - Miscellaneous []  - External ear exam 0 Lodato, Syna L. ([id number]) []  - Specimen Collection (cultures, biopsies, blood, body fluids, etc.) 0 []  - Specimen(s) / Culture(s) sent or taken to Lab for analysis 0 []  - Patient Transfer (multiple staff / Michiel Sites Lift / Similar devices) 0 []  - Simple Staple / Suture removal (25 or less) 0 []  - Complex Staple / Suture removal (26 or more) 0 []  - Hypo / Hyperglycemic Management (close monitor of Blood Glucose) 0 []  - Ankle / Brachial Index (ABI) - do not check if billed separately 0 X - Vital Signs 1 5 Has the patient been seen at the hospital within the last three years: Yes Total Score: 135 Level Of Care: New/Established - Level 4 Electronic Signature(s) Signed: 11/07/2015 4:34:19 PM By: Lucrezia Starch, RN, Sendra Entered By: Lucrezia Starch RN, Sendra on 11/07/2015 14:04:42 Ehrsam, Levora Angel ([id number]) -------------------------------------------------------------------------------- Encounter Discharge Information Details Patient Name: Marie Burner L. Date of Service: 11/07/2015 1:45 PM Medical Record Number: [id number] Patient Account Number: [id number] Date of Birth/Sex: 01/27/1941 (75 y.o. Female) Treating RN: Leonard Downing Primary Care Physician: Elizabeth Sauer Other Clinician: Referring Physician: Elizabeth Sauer Treating Physician/Extender: Rudene Re in Treatment: 6 Encounter Discharge Information Items Discharge Pain Level: 0 Discharge Condition: Stable Ambulatory Status: Walker Discharge Destination:  Home Private Transportation: Auto Accompanied By: self Schedule Follow-up Appointment: Yes Medication Reconciliation completed Yes and provided to Patient/Care Jahmil Macleod: Patient Clinical Summary of Care: Declined Electronic Signature(s) Signed: 11/07/2015 4:34:19 PM By: Lucrezia Starch RN, Rosalio Macadamia Previous Signature: 11/07/2015 1:55:37 PM Version By: Gwenlyn Perking Entered By: Lucrezia Starch RN, Sendra on 11/07/2015 13:57:07 Maddux, Levora Angel ([id number]) -------------------------------------------------------------------------------- Lower Extremity Assessment Details Patient Name: Vanalstyne, Emmarose L. Date of Service: 11/07/2015 1:45 PM Medical Record Number: [id number] Patient Account Number: [id number] Date of Birth/Sex: 29-Jul-1940 (75 y.o. Female) Treating RN: Huel Coventry Primary Care Physician: Elizabeth Sauer Other Clinician: Referring Physician: Elizabeth Sauer Treating Physician/Extender: Rudene Re in Treatment: 6 Edema Assessment Assessed: Kyra Searles: No] [Right: No] E[Left: dema] [Right: :] Calf Left: Right: Point of Measurement: 34 cm From Medial Instep 35 cm 34.5 cm Ankle Left: Right: Point of Measurement: 11 cm From Medial Instep 27.5 cm 29 cm Vascular Assessment Pulses: Posterior Tibial Dorsalis Pedis Palpable: [Left:Yes] [Right:Yes] Extremity colors, hair growth, and conditions: Extremity Color: [Left:Red] [Right:Red] Hair Growth on Extremity: [Left:No] [Right:No] Temperature of Extremity: [Left:Cool] [Right:Cool] Capillary Refill: [Left:< 3 seconds] [Right:< 3 seconds] Toe Nail Assessment Left: Right: Thick: Yes Yes Discolored: Yes Yes Deformed: No No Improper Length and Hygiene: No No Electronic Signature(s) Signed: 11/07/2015 2:37:38 PM By: Elliot Gurney, RN, BSN, Kim RN, BSN Entered By: Elliot Gurney, RN, BSN, Kim on 11/07/2015 13:37:57 Switzer, Levora Angel ([id number]) -------------------------------------------------------------------------------- Pain Assessment Details Patient  Name: Nee, Islah L. Date of Service: 11/07/2015 1:45 PM Medical Record Number: [id number] Patient Account Number: [id number] Date of Birth/Sex: 1941/03/29 (75 y.o. Female) Treating RN: Huel Coventry Primary Care Physician: Elizabeth Sauer Other Clinician: Referring Physician: Elizabeth Sauer Treating Physician/Extender: Rudene Re in Treatment: 6 Active Problems Location of Pain Severity and Description of Pain Patient Has Paino No Site Locations Pain Management and Medication Current Pain Management:  Electronic Signature(s) Signed: 11/07/2015 2:37:38 PM By: Elliot GurneyWoody, RN, BSN, Kim RN, BSN Entered By: Elliot GurneyWoody, RN, BSN, Kim on 11/07/2015 13:27:13 Mossberg, Levora AngelANNIE L. ([id number]) -------------------------------------------------------------------------------- Patient/Caregiver Education Details Patient Name: Marie BurnerSIMMONS, Darby L. Date of Service: 11/07/2015 1:45 PM Medical Record Number: [id number] Patient Account Number: [id number] Date of Birth/Gender: 04/26/1941 (75 y.o. Female) Treating RN: Leonard Downingoseboro, Sendra Primary Care Physician: Elizabeth SauerJones, Deanna Other Clinician: Referring Physician: Elizabeth SauerJones, Deanna Treating Physician/Extender: Rudene ReBritto, Errol Weeks in Treatment: 6 Education Assessment Education Provided To: Patient Education Topics Provided Venous: Controlling Swelling with Compression Stockings , Other: patient to wear compression Handouts: stockings daily Methods: Demonstration Responses: State content correctly Electronic Signature(s) Signed: 11/07/2015 4:34:19 PM By: Lucrezia Starchoseboro, RN, Sendra Entered By: Lucrezia Starchoseboro, RN, Sendra on 11/07/2015 13:57:43 Cavalieri, Levora AngelANNIE L. ([id number]) -------------------------------------------------------------------------------- Wound Assessment Details Patient Name: Mader, Seraphine L. Date of Service: 11/07/2015 1:45 PM Medical Record Number: [id number] Patient Account Number: [id number] Date of Birth/Sex: 04/26/1941 (75 y.o. Female) Treating RN: Huel CoventryWoody,  Kim Primary Care Physician: Elizabeth SauerJones, Deanna Other Clinician: Referring Physician: Elizabeth SauerJones, Deanna Treating Physician/Extender: Rudene ReBritto, Errol Weeks in Treatment: 6 Wound Status Wound Number: 1 Primary Etiology: Lymphedema Wound Location: Left, Circumferential Lower Leg Wound Status: Open Wounding Event: Gradually Appeared Date Acquired: 09/07/2014 Weeks Of Treatment: 6 Clustered Wound: No Photos Photo Uploaded By: Elliot GurneyWoody, RN, BSN, Kim on 11/07/2015 14:07:09 Wound Measurements Length: (cm) 0.1 Width: (cm) 0.1 Depth: (cm) 0.1 Area: (cm) 0.008 Volume: (cm) 0.001 % Reduction in Area: 100% % Reduction in Volume: 100% Wound Description Classification: Partial Thickness Periwound Skin Texture Texture Color No Abnormalities Noted: No No Abnormalities Noted: No Templeman, Tomiko L. ([id number]) Moisture No Abnormalities Noted: No Treatment Notes Wound #1 (Left, Circumferential Lower Leg) 1. Cleansed with: Cleanse wound with antibacterial soap and water 3. Peri-wound Care: Moisturizing lotion 7. Secured with Patient to wear own compression stockings Support Garment 20-30 mm/Hg pressure to: Notes HHRN to be DC'd and Juxtalites to be ordered for patient. Patient to bring garments to next visit. Electronic Signature(s) Signed: 11/07/2015 2:37:38 PM By: Elliot GurneyWoody, RN, BSN, Kim RN, BSN Entered By: Elliot GurneyWoody, RN, BSN, Kim on 11/07/2015 13:38:25 Placido, Levora AngelANNIE L. ([id number]) -------------------------------------------------------------------------------- Wound Assessment Details Patient Name: Vertz, Vail L. Date of Service: 11/07/2015 1:45 PM Medical Record Number: [id number] Patient Account Number: [id number] Date of Birth/Sex: 04/26/1941 (75 y.o. Female) Treating RN: Huel CoventryWoody, Kim Primary Care Physician: Elizabeth SauerJones, Deanna Other Clinician: Referring Physician: Elizabeth SauerJones, Deanna Treating Physician/Extender: Rudene ReBritto, Errol Weeks in Treatment: 6 Wound Status Wound Number: 2 Primary Etiology:  Lymphedema Wound Location: Right, Circumferential Lower Wound Status: Open Leg Wounding Event: Gradually Appeared Date Acquired: 09/07/2014 Weeks Of Treatment: 6 Clustered Wound: No Photos Photo Uploaded By: Elliot GurneyWoody, RN, BSN, Kim on 11/07/2015 14:07:10 Wound Measurements Length: (cm) 0.1 Width: (cm) 0.1 Depth: (cm) 0.1 Area: (cm) 0.008 Volume: (cm) 0.001 % Reduction in Area: 100% % Reduction in Volume: 100% Wound Description Classification: Partial Thickness Periwound Skin Texture Texture Color Haning, Shatonya L. ([id number]) No Abnormalities Noted: No No Abnormalities Noted: No Moisture No Abnormalities Noted: No Treatment Notes Wound #2 (Right, Circumferential Lower Leg) 1. Cleansed with: Cleanse wound with antibacterial soap and water 3. Peri-wound Care: Moisturizing lotion 7. Secured with Patient to wear own compression stockings Support Garment 20-30 mm/Hg pressure to: Notes HHRN to be DC'd and Juxtalites to be ordered for patient. Patient to bring garments to next visit. Electronic Signature(s) Signed: 11/07/2015 2:37:38 PM By: Elliot GurneyWoody, RN, BSN, Kim RN, BSN Entered By: Elliot GurneyWoody, RN, BSN, Kim on 11/07/2015 13:38:25 Tom, Levora AngelANNIE L. ([id number]) -------------------------------------------------------------------------------- Vitals Details  Patient Name: CAPRI, VEALS. Date of Service: 11/07/2015 1:45 PM Medical Record Number: [id number] Patient Account Number: [id number] Date of Birth/Sex: 06-08-1941 (75 y.o. Female) Treating RN: Huel Coventry Primary Care Physician: Elizabeth Sauer Other Clinician: Referring Physician: Elizabeth Sauer Treating Physician/Extender: Rudene Re in Treatment: 6 Vital Signs Time Taken: 13:27 Temperature (F): 97.7 Height (in): 67 Pulse (bpm): 71 Weight (lbs): 189 Respiratory Rate (breaths/min): 18 Body Mass Index (BMI): 29.6 Blood Pressure (mmHg): 122/74 Reference Range: 80 - 120 mg / dl Electronic Signature(s) Signed:  11/07/2015 2:37:38 PM By: Elliot Gurney, RN, BSN, Kim RN, BSN Entered By: Elliot Gurney, RN, BSN, Kim on 11/07/2015 40:98:11

## 2015-11-07 NOTE — Progress Notes (Addendum)
[name], [name] ([id number]) Visit Report for 11/07/2015 Chief Complaint Document Details Patient Name: [name], [name] Date of Service: 11/07/2015 1:45 PM Medical Record Number: [id number] Patient Account Number: [id number] Date of Birth/Sex: [birth year] (75 y.o. Female) Treating RN: Leonard Down[place], [place]ra Primary Care Physician: Elizabeth SauerJones, Deanna Other Clinician: Referring Physician: Elizabeth SauerJones, Deanna Treating Physician/Extender: Rudene ReBritto, Susan Bleich Weeks in Treatment: 6 Information Obtained from: Patient Chief Complaint Patient presents for treatment of an open ulcer due to venous insufficiency which has been continuing this way for several months Electronic Signature(s) Signed: 11/07/2015 1:47:35 PM By: Evlyn KannerBritto, Rosi Secrist MD, FACS Entered By: Evlyn KannerBritto, Klover Priestly on 11/07/2015 13:47:35 [name], [name] ([id number]) -------------------------------------------------------------------------------- HPI Details Patient Name: [name], [name] Date of Service: 11/07/2015 1:45 PM Medical Record Number: [id number] Patient Account Number: [id number] Date of Birth/Sex: [birth year] (75 y.o. Female) Treating RN: Leonard Down[place], Sendra Primary Care Physician: Elizabeth SauerJones, Deanna Other Clinician: Referring Physician: Elizabeth SauerJones, Deanna Treating Physician/Extender: Rudene ReBritto, Dennie Moltz Weeks in Treatment: 6 History of Present Illness Location: bilateral lower extremity wounds Quality: Patient reports experiencing heaviness to affected area(s). Severity: Patient states wound are getting worse. Duration: Patient has had the wound for > 8 months prior to seeking treatment at the wound center Timing: Pain in wound is Intermittent (comes and goes Context: The wound would happen gradually Modifying Factors: Other treatment(s) tried include:Unna's boots and treatment by Dr. Gilda CreaseSchnier at the vascular surgery office Associated Signs and Symptoms: Patient reports having increase discharge. HPI Description: 75 year old patient who is a poor historian but most  of the history is being obtained from Dr. Levora DredgeGregory Schnier her vascular surgeon who has been treating her for about 8 months to a year. the patient has a postphlebitic syndrome both lower extremities and has been under his care for a while. The ABIs done in 2013 were normal and he does not suspect any vascular arterial compromise as the resting ABI on the right was 1.03 on the left was 1.02. An ultrasound of the lower extremity bilaterally did not reveal any DVT or superficial thrombophlebitis bilaterally. Patient's initial problem started when she had a hematoma for right lower extremity due to being on Coumadin and then developed a lot of swelling and has been having Unna's boots for a long while. However recently her left lower extremity started swelling and oozing profusely and the patient did not want to have Unna's boots applied and hence Dr. Gilda CreaseSchnier has referred her to us for possible failure of 4 layer wraps with Profore. past medical history does not include diabetes mellitus but she has a cardiac issue and has had some element of CHF. She has been treated with lymphedema pumps in the past. 10/03/2015 -- the patient's lower extremities have been weeping very significantly and she has had a lot of swelling and redness since the last few days. She was seen by Dr. Gilda CreaseSchnier this morning and he had recommended admission to the hospital but the patient was unwilling to have that done and came here for a second opinion. 10/10/2015 -- was admitted to the hospital March 27 to 10/07/2015 for bilateral cellulitis of lower extremities and also had chronic A. fib, chronic systolic heart failure, venous insufficiency of the legs and coronary artery disease. She was given 5 days of vancomycin and ampicillin and sulbactam. She was seen by Dr. Wyn Quakerew and while she was inpatient and he recommended continuing with compression and oral antibiotics ( Augmentin ) at least for 2 weeks. 10/31/2015 -- her home  health nurses have not yet ordered her juxta lites stockings  and we will re-order them and talk to them if they have any questions 11/07/2015 -- the home health nurses have not got her juxta lites stockings but they have got her the 20-30 mm compression stockings the patient says is going to be difficult for her to [name] ([id number]) Electronic Signature(s) Signed: 11/07/2015 1:48:12 PM By: Evlyn Kanner MD, FACS Entered By: Evlyn Kanner on 11/07/2015 13:48:12 [name], [name] ([id number]) -------------------------------------------------------------------------------- Physical Exam Details Patient Name: [name], [name] Date of Service: 11/07/2015 1:45 PM Medical Record Number: [id number] Patient Account Number: [id number] Date of Birth/Sex: 23-Apr-1941 (75 y.o. Female) Treating RN: Leonard Downing Primary Care Physician: Elizabeth Sauer Other Clinician: Referring Physician: Elizabeth Sauer Treating Physician/Extender: Rudene Re in Treatment: 6 Constitutional . Pulse regular. Respirations normal and unlabored. Afebrile. . Eyes Nonicteric. Reactive to light. Ears, Nose, Mouth, and Throat Lips, teeth, and gums WNL.Marland Kitchen Moist mucosa without lesions. Neck supple and nontender. No palpable supraclavicular or cervical adenopathy. Normal sized without goiter. Respiratory WNL. No retractions.. Breath sounds WNL, No rubs, rales, rhonchi, or wheeze.. Cardiovascular Heart rhythm and rate regular, no murmur or gallop.. Pedal Pulses WNL. No clubbing, cyanosis or edema. Chest Breasts symmetical and no nipple discharge.. Breast tissue WNL, no masses, lumps, or tenderness.. Lymphatic No adneopathy. No adenopathy. No adenopathy. Musculoskeletal Adexa without tenderness or enlargement.. Digits and nails w/o clubbing, cyanosis, infection, petechiae, ischemia, or inflammatory conditions.. Integumentary (Hair, Skin) No suspicious lesions. No crepitus or fluctuance. No peri-wound  warmth or erythema. No masses.Marland Kitchen Psychiatric Judgement and insight Intact.. No evidence of depression, anxiety, or agitation.. Notes the lymphedema has gone down markedly and she has no open ulcerations. She does continue to have scaly skin and overall the wounds are all healed. Electronic Signature(s) Signed: 11/07/2015 1:49:34 PM By: Evlyn Kanner MD, FACS Previous Signature: 11/07/2015 1:48:41 PM Version By: Evlyn Kanner MD, FACS Entered By: Evlyn Kanner on 11/07/2015 13:49:34 Trevor, [name] ([id number]) -------------------------------------------------------------------------------- Physician Orders Details Patient Name: Perkovich, [name] Date of Service: 11/07/2015 1:45 PM Medical Record Number: [id number] Patient Account Number: [id number] Date of Birth/Sex: 14-Jul-1940 (75 y.o. Female) Treating RN: Leonard Downing Primary Care Physician: Elizabeth Sauer Other Clinician: Referring Physician: Elizabeth Sauer Treating Physician/Extender: Rudene Re in Treatment: 6 Verbal / Phone Orders: Yes Clinician: Leonard Downing Read Back and Verified: Yes Diagnosis Coding Wound Cleansing Wound #1 Left,Circumferential Lower Leg o May Shower, gently pat wound dry prior to applying new dressing. Wound #2 Right,Circumferential Lower Leg o May Shower, gently pat wound dry prior to applying new dressing. Skin Barriers/Peri-Wound Care Wound #1 Left,Circumferential Lower Leg o Moisturizing lotion Wound #2 Right,Circumferential Lower Leg o Moisturizing lotion Primary Wound Dressing Wound #1 Left,Circumferential Lower Leg o Dry Gauze Wound #2 Right,Circumferential Lower Leg o Dry Gauze Dressing Change Frequency Wound #1 Left,Circumferential Lower Leg o Change dressing every day. Wound #2 Right,Circumferential Lower Leg o Change dressing every day. Follow-up Appointments Wound #1 Left,Circumferential Lower Leg o Return Appointment in 1 week. - or when juxta lite  system come Wound #2 Right,Circumferential Lower Leg o Return Appointment in 1 week. - or when juxta lite system come Edema Control Ambrosio, [name] ([id number]) Wound #1 Left,Circumferential Lower Leg o Support Garment 20-30 mm/Hg pressure to: - order patient juxta lite to come to the house Wound #2 Right,Circumferential Lower Leg o Support Garment 20-30 mm/Hg pressure to: - order patient juxta lite to come to the house Additional Orders / Instructions Wound #1 Left,Circumferential Lower Leg o Increase protein intake. Wound #  2 Right,Circumferential Lower Leg o Increase protein intake. Home Health Wound #1 Left,Circumferential Lower Leg o Please direct any NON-WOUND related issues/requests for orders to patient's Primary Care Physician o D/C Home Health Services Electronic Signature(s) Signed: 11/07/2015 4:16:10 PM By: Evlyn Kanner MD, FACS Signed: 11/07/2015 4:34:19 PM By: Lucrezia Starch RN, [place] Entered By: Lucrezia Starch RN, Sendra on 11/07/2015 13:50:36 Dam, [name] ([id number]) -------------------------------------------------------------------------------- Problem List Details Patient Name: Wawrzyniak, Braidyn L. Date of Service: 11/07/2015 1:45 PM Medical Record Number: [id number] Patient Account Number: [id number] Date of Birth/Sex: 1940-10-06 (75 y.o. 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 Date of Service: 11/07/2015 1:45 PM Medical Record Number: [id number] Patient Account Number: [id number] Date of Birth/Sex: 07-24-1940 (75 y.o. Female) Treating RN: Leonard Downing Primary Care Physician: Elizabeth Sauer Other Clinician: Referring Physician: Elizabeth Sauer Treating Physician/Extender: Rudene Re in Treatment: 6 Subjective Chief Complaint Information obtained from Patient Patient presents for treatment of an open ulcer due to venous insufficiency which has been continuing this way for several months History of Present Illness (HPI) The following HPI elements were documented for the patient's wound: Location: bilateral lower extremity wounds Quality: Patient reports experiencing heaviness to affected area(s). Severity: Patient states wound are getting worse. Duration: Patient has had the wound for > 8 months prior to seeking treatment at the wound center Timing: Pain in wound is Intermittent (comes and goes Context: The wound would happen gradually Modifying Factors: Other treatment(s) tried include:Unna's boots and treatment by Dr. Gilda Crease at the vascular surgery office Associated Signs and Symptoms: Patient reports having increase discharge. 75 year old patient who is a poor historian but most of the history is being obtained from Dr. Levora Dredge her vascular surgeon who has been treating her for about 8 months to a year. the patient has a postphlebitic  syndrome both lower extremities and has been under his care for a while. The ABIs done in 2013 were normal and he does not suspect any vascular arterial compromise as the resting ABI on the right was 1.03 on the left was 1.02. An ultrasound of the lower extremity bilaterally did not reveal any DVT or superficial thrombophlebitis bilaterally. Patient's initial problem started when she had a hematoma for right lower extremity due to being on Coumadin and then developed a lot of swelling and has been having Unna's boots for a long while. However recently her left lower extremity started swelling and oozing profusely and the patient did not want to have Unna's boots applied and hence Dr. Gilda Crease has referred her to [place] for possible failure of 4 layer wraps with Profore. past medical history does not include diabetes mellitus but she has a cardiac issue and has had some element of CHF.  She has been treated with lymphedema pumps in the past. 10/03/2015 -- the patient's lower extremities have been weeping very significantly and she has had a lot of swelling and redness since the last few days. She was seen by Dr. Gilda Crease this morning and he had recommended admission to the hospital but the patient was unwilling to have that done and came here for a second opinion. 10/10/2015 -- was admitted to the hospital March 27 to 10/07/2015 for bilateral cellulitis of lower extremities and also had chronic A. fib, chronic systolic heart failure, venous insufficiency of the legs and coronary Boylan, [name] ([id number]) artery disease. She was given 5 days of vancomycin and ampicillin and sulbactam. She was seen by Dr. Wyn Quaker and while she was inpatient and he recommended continuing with compression and oral antibiotics ( Augmentin ) at least for 2 weeks. 10/31/2015 -- her home health nurses have not yet ordered her juxta lites stockings and we will re-order them and talk to them if they have any  questions 11/07/2015 -- the home health nurses have not got her juxta lites stockings but they have got her the 20-30 mm compression stockings the patient says is going to be difficult for her to [facility] Objective Constitutional Pulse regular. Respirations normal and unlabored. Afebrile. Vitals Time Taken: 1:27 PM, Height: 67 in, Weight: 189 lbs, BMI: 29.6, Temperature: 97.7 F, Pulse: 71 bpm, Respiratory Rate: 18 breaths/min, Blood Pressure: 122/74 mmHg. Eyes Nonicteric. Reactive to light. Ears, Nose, Mouth, and Throat Lips, teeth, and gums WNL.Marland Kitchen Moist mucosa without lesions. Neck supple and nontender. No palpable supraclavicular or cervical adenopathy. Normal sized without goiter. Respiratory WNL. No retractions.. Breath sounds WNL, No rubs, rales, rhonchi, or wheeze.. Cardiovascular Heart rhythm and rate regular, no murmur or gallop.. Pedal Pulses WNL. No clubbing, cyanosis or edema. Chest Breasts symmetical and no nipple discharge.. Breast tissue WNL, no masses, lumps, or tenderness.. Lymphatic No adneopathy. No adenopathy. No adenopathy. Musculoskeletal Adexa without tenderness or enlargement.. Digits and nails w/o clubbing, cyanosis, infection, petechiae, ischemia, or inflammatory conditions.Marland Kitchen Psychiatric Judgement and insight Intact.. No evidence of depression, anxiety, or agitation.Marland Kitchen [name], [name] ([id number]) General Notes: the lymphedema has gone down markedly and she has no open ulcerations. She does continue to have scaly skin and overall the wounds are all healed. Integumentary (Hair, Skin) No suspicious lesions. No crepitus or fluctuance. No peri-wound warmth or erythema. No masses.. Wound #1 status is Open. Original cause of wound was Gradually Appeared. The wound is located on the Left,Circumferential Lower Leg. The wound measures 0.1cm length x 0.1cm width x 0.1cm depth; 0.008cm^2 area and 0.001cm^3 volume. Wound #2 status is Open. Original cause of wound was  Gradually Appeared. The wound is located on the Right,Circumferential Lower Leg. The wound measures 0.1cm length x 0.1cm width x 0.1cm depth; 0.008cm^2 area and 0.001cm^3 volume. 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 She is urged to get juxta light compression stockings through  her insurance company and we will make the recommendation. She is also urged to wear her lymphedema pumps as advised by her vascular group Plan Her wounds have completely healed and I'm discharging her from the wound care services with explicit instructions to wear compression stockings from morning to night at all times except while she is in bed. CHIZARAM, [name] ([id number]) She is urged to get juxta light compression stockings through her insurance company and we will make the recommendation. She is also urged to wear her lymphedema pumps as advised by her vascular group Electronic Signature(s) Signed: 11/07/2015 1:51:03 PM By: Evlyn Kanner MD, FACS Entered By: Evlyn Kanner on 11/07/2015 13:51:02 Brutus, [name] ([id number]) -------------------------------------------------------------------------------- SuperBill Details Patient Name: Morrissey, [name] Date of Service: 11/07/2015 Medical Record Number: [id number] Patient Account Number: [id number] Date of Birth/Sex: Mar 26, 1941 (75 y.o.  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 E66.3 Overweight L03.116 Cellulitis of left lower limb L03.115 Cellulitis of right lower limb Facility Procedures CPT4 Code: [id number] Description: 99214 - WOUND CARE VISIT-LEV 4 EST PT Modifier: Quantity: 1 Physician Procedures CPT4: Description Modifier Quantity Code [id number] 99213 - WC PHYS LEVEL 3 - EST PT 1 ICD-10 Description Diagnosis I87.031 Postthrombotic syndrome with ulcer and inflammation of right lower extremity L97.222 Non-pressure chronic ulcer of left calf with fat  layer exposed I87.032 Postthrombotic syndrome with ulcer and inflammation of left lower extremity L97.212 Non-pressure chronic ulcer of right calf with fat layer exposed Electronic Signature(s) Signed: 11/07/2015 4:16:10 PM By: Evlyn Kanner MD, FACS Signed: 11/07/2015 4:34:19 PM By: Lucrezia Starch RN, [place] Previous Signature: 11/07/2015 1:51:33 PM Version By: Evlyn Kanner MD, FACS Entered By: Lucrezia Starch RN, [place] on 11/07/2015 14:05:08

## 2015-11-09 DIAGNOSIS — Z87891 Personal history of nicotine dependence: Secondary | ICD-10-CM | POA: Diagnosis not present

## 2015-11-09 DIAGNOSIS — M159 Polyosteoarthritis, unspecified: Secondary | ICD-10-CM | POA: Diagnosis not present

## 2015-11-09 DIAGNOSIS — Z9181 History of falling: Secondary | ICD-10-CM | POA: Diagnosis not present

## 2015-11-09 DIAGNOSIS — I482 Chronic atrial fibrillation: Secondary | ICD-10-CM | POA: Diagnosis not present

## 2015-11-09 DIAGNOSIS — L97811 Non-pressure chronic ulcer of other part of right lower leg limited to breakdown of skin: Secondary | ICD-10-CM | POA: Diagnosis not present

## 2015-11-09 DIAGNOSIS — I110 Hypertensive heart disease with heart failure: Secondary | ICD-10-CM | POA: Diagnosis not present

## 2015-11-09 DIAGNOSIS — I340 Nonrheumatic mitral (valve) insufficiency: Secondary | ICD-10-CM | POA: Diagnosis not present

## 2015-11-09 DIAGNOSIS — I872 Venous insufficiency (chronic) (peripheral): Secondary | ICD-10-CM | POA: Diagnosis not present

## 2015-11-09 DIAGNOSIS — D509 Iron deficiency anemia, unspecified: Secondary | ICD-10-CM | POA: Diagnosis not present

## 2015-11-09 DIAGNOSIS — I272 Other secondary pulmonary hypertension: Secondary | ICD-10-CM | POA: Diagnosis not present

## 2015-11-09 DIAGNOSIS — I5022 Chronic systolic (congestive) heart failure: Secondary | ICD-10-CM | POA: Diagnosis not present

## 2015-11-11 DIAGNOSIS — I110 Hypertensive heart disease with heart failure: Secondary | ICD-10-CM | POA: Diagnosis not present

## 2015-11-11 DIAGNOSIS — Z87891 Personal history of nicotine dependence: Secondary | ICD-10-CM | POA: Diagnosis not present

## 2015-11-11 DIAGNOSIS — I482 Chronic atrial fibrillation: Secondary | ICD-10-CM | POA: Diagnosis not present

## 2015-11-11 DIAGNOSIS — I5022 Chronic systolic (congestive) heart failure: Secondary | ICD-10-CM | POA: Diagnosis not present

## 2015-11-11 DIAGNOSIS — M159 Polyosteoarthritis, unspecified: Secondary | ICD-10-CM | POA: Diagnosis not present

## 2015-11-11 DIAGNOSIS — I872 Venous insufficiency (chronic) (peripheral): Secondary | ICD-10-CM | POA: Diagnosis not present

## 2015-11-11 DIAGNOSIS — I272 Other secondary pulmonary hypertension: Secondary | ICD-10-CM | POA: Diagnosis not present

## 2015-11-11 DIAGNOSIS — D509 Iron deficiency anemia, unspecified: Secondary | ICD-10-CM | POA: Diagnosis not present

## 2015-11-11 DIAGNOSIS — I340 Nonrheumatic mitral (valve) insufficiency: Secondary | ICD-10-CM | POA: Diagnosis not present

## 2015-11-11 DIAGNOSIS — L97811 Non-pressure chronic ulcer of other part of right lower leg limited to breakdown of skin: Secondary | ICD-10-CM | POA: Diagnosis not present

## 2015-11-11 DIAGNOSIS — Z9181 History of falling: Secondary | ICD-10-CM | POA: Diagnosis not present

## 2015-11-14 ENCOUNTER — Ambulatory Visit: Payer: Medicare (Managed Care) | Admitting: Surgery

## 2015-11-16 DIAGNOSIS — I482 Chronic atrial fibrillation: Secondary | ICD-10-CM | POA: Diagnosis not present

## 2015-11-16 DIAGNOSIS — I340 Nonrheumatic mitral (valve) insufficiency: Secondary | ICD-10-CM | POA: Diagnosis not present

## 2015-11-16 DIAGNOSIS — D509 Iron deficiency anemia, unspecified: Secondary | ICD-10-CM | POA: Diagnosis not present

## 2015-11-16 DIAGNOSIS — I272 Other secondary pulmonary hypertension: Secondary | ICD-10-CM | POA: Diagnosis not present

## 2015-11-16 DIAGNOSIS — I5022 Chronic systolic (congestive) heart failure: Secondary | ICD-10-CM | POA: Diagnosis not present

## 2015-11-16 DIAGNOSIS — I872 Venous insufficiency (chronic) (peripheral): Secondary | ICD-10-CM | POA: Diagnosis not present

## 2015-11-16 DIAGNOSIS — Z9181 History of falling: Secondary | ICD-10-CM | POA: Diagnosis not present

## 2015-11-16 DIAGNOSIS — M159 Polyosteoarthritis, unspecified: Secondary | ICD-10-CM | POA: Diagnosis not present

## 2015-11-16 DIAGNOSIS — Z87891 Personal history of nicotine dependence: Secondary | ICD-10-CM | POA: Diagnosis not present

## 2015-11-16 DIAGNOSIS — I110 Hypertensive heart disease with heart failure: Secondary | ICD-10-CM | POA: Diagnosis not present

## 2015-11-16 DIAGNOSIS — L97811 Non-pressure chronic ulcer of other part of right lower leg limited to breakdown of skin: Secondary | ICD-10-CM | POA: Diagnosis not present

## 2015-11-18 DIAGNOSIS — I872 Venous insufficiency (chronic) (peripheral): Secondary | ICD-10-CM | POA: Diagnosis not present

## 2015-11-18 DIAGNOSIS — L97811 Non-pressure chronic ulcer of other part of right lower leg limited to breakdown of skin: Secondary | ICD-10-CM | POA: Diagnosis not present

## 2015-11-18 DIAGNOSIS — I110 Hypertensive heart disease with heart failure: Secondary | ICD-10-CM | POA: Diagnosis not present

## 2015-11-18 DIAGNOSIS — I272 Other secondary pulmonary hypertension: Secondary | ICD-10-CM | POA: Diagnosis not present

## 2015-11-18 DIAGNOSIS — Z9181 History of falling: Secondary | ICD-10-CM | POA: Diagnosis not present

## 2015-11-18 DIAGNOSIS — Z87891 Personal history of nicotine dependence: Secondary | ICD-10-CM | POA: Diagnosis not present

## 2015-11-18 DIAGNOSIS — I340 Nonrheumatic mitral (valve) insufficiency: Secondary | ICD-10-CM | POA: Diagnosis not present

## 2015-11-18 DIAGNOSIS — I5022 Chronic systolic (congestive) heart failure: Secondary | ICD-10-CM | POA: Diagnosis not present

## 2015-11-18 DIAGNOSIS — M159 Polyosteoarthritis, unspecified: Secondary | ICD-10-CM | POA: Diagnosis not present

## 2015-11-18 DIAGNOSIS — D509 Iron deficiency anemia, unspecified: Secondary | ICD-10-CM | POA: Diagnosis not present

## 2015-11-18 DIAGNOSIS — I482 Chronic atrial fibrillation: Secondary | ICD-10-CM | POA: Diagnosis not present

## 2015-11-21 ENCOUNTER — Encounter: Payer: Medicare Other | Admitting: Surgery

## 2015-11-21 DIAGNOSIS — I87031 Postthrombotic syndrome with ulcer and inflammation of right lower extremity: Secondary | ICD-10-CM | POA: Diagnosis not present

## 2015-11-21 DIAGNOSIS — I87032 Postthrombotic syndrome with ulcer and inflammation of left lower extremity: Secondary | ICD-10-CM | POA: Diagnosis not present

## 2015-11-21 DIAGNOSIS — Z86718 Personal history of other venous thrombosis and embolism: Secondary | ICD-10-CM | POA: Diagnosis not present

## 2015-11-21 DIAGNOSIS — L97221 Non-pressure chronic ulcer of left calf limited to breakdown of skin: Secondary | ICD-10-CM | POA: Diagnosis not present

## 2015-11-21 DIAGNOSIS — L97222 Non-pressure chronic ulcer of left calf with fat layer exposed: Secondary | ICD-10-CM | POA: Diagnosis not present

## 2015-11-21 DIAGNOSIS — L97212 Non-pressure chronic ulcer of right calf with fat layer exposed: Secondary | ICD-10-CM | POA: Diagnosis not present

## 2015-11-21 DIAGNOSIS — I872 Venous insufficiency (chronic) (peripheral): Secondary | ICD-10-CM | POA: Diagnosis not present

## 2015-11-21 DIAGNOSIS — I890 Lymphedema, not elsewhere classified: Secondary | ICD-10-CM | POA: Diagnosis not present

## 2015-11-21 DIAGNOSIS — L03115 Cellulitis of right lower limb: Secondary | ICD-10-CM | POA: Diagnosis not present

## 2015-11-21 DIAGNOSIS — Z87891 Personal history of nicotine dependence: Secondary | ICD-10-CM | POA: Diagnosis not present

## 2015-11-21 DIAGNOSIS — I5022 Chronic systolic (congestive) heart failure: Secondary | ICD-10-CM | POA: Diagnosis not present

## 2015-11-21 DIAGNOSIS — I2510 Atherosclerotic heart disease of native coronary artery without angina pectoris: Secondary | ICD-10-CM | POA: Diagnosis not present

## 2015-11-21 DIAGNOSIS — L03116 Cellulitis of left lower limb: Secondary | ICD-10-CM | POA: Diagnosis not present

## 2015-11-21 DIAGNOSIS — I110 Hypertensive heart disease with heart failure: Secondary | ICD-10-CM | POA: Diagnosis not present

## 2015-11-21 DIAGNOSIS — L97211 Non-pressure chronic ulcer of right calf limited to breakdown of skin: Secondary | ICD-10-CM | POA: Diagnosis not present

## 2015-11-21 DIAGNOSIS — I4891 Unspecified atrial fibrillation: Secondary | ICD-10-CM | POA: Diagnosis not present

## 2015-11-21 NOTE — Progress Notes (Addendum)
JIMEKA, BALAN ([id number]) Visit Report for 11/21/2015 Chief Complaint Document Details Patient Name: [name], [name] 11/21/2015 12:45 Date of Service: PM Medical Record [id number] Number: Patient Account Number: [id number] 29-Nov-1940 (75 y.o. Treating RN: Leonard Downing Date of Birth/Sex: Female) Other Clinician: Primary Care Physician: Elizabeth Sauer Treating Evlyn Kanner Referring Physician: Elizabeth Sauer Physician/Extender: Tania Ade in Treatment: 8 Information Obtained from: Patient Chief Complaint Patient presents for treatment of an open ulcer due to venous insufficiency which has been continuing this way for several months Electronic Signature(s) Signed: 11/21/2015 1:14:42 PM By: Evlyn Kanner MD, FACS Entered By: Evlyn Kanner on 11/21/2015 13:14:42 Rowland, Levora Angel ([id number]) -------------------------------------------------------------------------------- HPI Details Patient Name: [name] 11/21/2015 12:45 Date of Service: PM Medical Record [id number] Number: Patient Account Number: [id number] 1941/07/03 (74 y.o. Treating RN: Leonard Downing Date of Birth/Sex: Female) Other Clinician: Primary Care Physician: Elizabeth Sauer Treating Evlyn Kanner Referring Physician: Elizabeth Sauer Physician/Extender: Tania Ade in Treatment: 8 History of Present Illness Location: bilateral lower extremity wounds Quality: Patient reports experiencing heaviness to affected area(s). Severity: Patient states wound are getting worse. Duration: Patient has had the wound for > 8 months prior to seeking treatment at the wound center Timing: Pain in wound is Intermittent (comes and goes Context: The wound would happen gradually Modifying Factors: Other treatment(s) tried include:Unna's boots and treatment by Dr. Gilda Crease at the vascular surgery office Associated Signs and Symptoms: Patient reports having increase discharge. HPI Description: 75 year old patient who is a poor  historian but most of the history is being obtained from Dr. Levora Dredge her vascular surgeon who has been treating her for about 8 months to a year. the patient has a postphlebitic syndrome both lower extremities and has been under his care for a while. The ABIs done in 2013 were normal and he does not suspect any vascular arterial compromise as the resting ABI on the right was 1.03 on the left was 1.02. An ultrasound of the lower extremity bilaterally did not reveal any DVT or superficial thrombophlebitis bilaterally. Patient's initial problem started when she had a hematoma for right lower extremity due to being on Coumadin and then developed a lot of swelling and has been having Unna's boots for a long while. However recently her left lower extremity started swelling and oozing profusely and the patient did not want to have Unna's boots applied and hence Dr. Gilda Crease has referred her to [place] for possible failure of 4 layer wraps with Profore. past medical history does not include diabetes mellitus but she has a cardiac issue and has had some element of CHF. She has been treated with lymphedema pumps in the past. 10/03/2015 -- the patient's lower extremities have been weeping very significantly and she has had a lot of swelling and redness since the last few days. She was seen by Dr. Gilda Crease this morning and he had recommended admission to the hospital but the patient was unwilling to have that done and came here for a second opinion. 10/10/2015 -- was admitted to the hospital March 27 to 10/07/2015 for bilateral cellulitis of lower extremities and also had chronic A. fib, chronic systolic heart failure, venous insufficiency of the legs and coronary artery disease. She was given 5 days of vancomycin and ampicillin and sulbactam. She was seen by Dr. Wyn Quaker and while she was inpatient and he recommended continuing with compression and oral antibiotics ( Augmentin ) at least for 2  weeks. 10/31/2015 -- her home health nurses have not yet ordered her juxta lites stockings  and we will re-order them and talk to them if they have any questions 11/07/2015 -- the home health nurses have not got her juxta lites stockings but they have got her the 20-30 mm compression stockings the patient says is going to be difficult for her to [facility]. JANNAE, FAGERSTROM ([id number]) 11/21/2015 -- she has not been very compliant and not wearing her compression stockings nor has she been using her juxta lites or using her lymphedema pumps. She has a new place opened up again on her right lower extremity. Electronic Signature(s) Signed: 11/21/2015 1:15:32 PM By: Evlyn Kanner MD, FACS Entered By: Evlyn Kanner on 11/21/2015 13:15:31 Tsan, Levora Angel ([id number]) -------------------------------------------------------------------------------- Physical Exam Details Patient Name: [name] 11/21/2015 12:45 Date of Service: PM Medical Record [id number] Number: Patient Account Number: [id number] 1940/10/11 (74 y.o. Treating RN: Leonard Downing Date of Birth/Sex: Female) Other Clinician: Primary Care Physician: Elizabeth Sauer Treating Evlyn Kanner Referring Physician: Elizabeth Sauer Physician/Extender: Tania Ade in Treatment: 8 Constitutional . Pulse regular. Respirations normal and unlabored. Afebrile. . Eyes Nonicteric. Reactive to light. Ears, Nose, Mouth, and Throat Lips, teeth, and gums WNL.Marland Kitchen Moist mucosa without lesions. Neck supple and nontender. No palpable supraclavicular or cervical adenopathy. Normal sized without goiter. Respiratory WNL. No retractions.. Cardiovascular Pedal Pulses WNL. No clubbing, cyanosis or edema. Lymphatic No adneopathy. No adenopathy. No adenopathy. Musculoskeletal Adexa without tenderness or enlargement.. Digits and nails w/o clubbing, cyanosis, infection, petechiae, ischemia, or inflammatory conditions.. Integumentary (Hair, Skin) No suspicious  lesions. No crepitus or fluctuance. No peri-wound warmth or erythema. No masses.Marland Kitchen Psychiatric Judgement and insight Intact.. No evidence of depression, anxiety, or agitation.. Notes the lymphedema has reappeared again and she's got an ulceration on her right lower extremity. On the left lower extremity she scored a few microperforations but there is no florid weeping. Electronic Signature(s) Signed: 11/21/2015 1:16:33 PM By: Evlyn Kanner MD, FACS Entered By: Evlyn Kanner on 11/21/2015 13:16:33 Woodle, Levora Angel ([id number]) -------------------------------------------------------------------------------- Physician Orders Details Patient Name: MANPREET, KEMMER 11/21/2015 12:45 Date of Service: PM Medical Record [id number] Number: Patient Account Number: [id number] 28-Apr-1941 (74 y.o. Treating RN: Leonard Downing Date of Birth/Sex: Female) Other Clinician: Primary Care Physician: Elizabeth Sauer Treating Evlyn Kanner Referring Physician: Elizabeth Sauer Physician/Extender: Tania Ade in Treatment: 8 Verbal / Phone Orders: Yes Clinician: Leonard Downing Read Back and Verified: Yes Diagnosis Coding Wound Cleansing Wound #1 Left,Circumferential Lower Leg o May Shower, gently pat wound dry prior to applying new dressing. Wound #2 Right,Circumferential Lower Leg o May Shower, gently pat wound dry prior to applying new dressing. Skin Barriers/Peri-Wound Care Wound #1 Left,Circumferential Lower Leg o Moisturizing lotion Wound #2 Right,Circumferential Lower Leg o Moisturizing lotion Primary Wound Dressing Wound #1 Left,Circumferential Lower Leg o Dry Gauze Wound #2 Right,Circumferential Lower Leg o Aquacel Ag Secondary Dressing Wound #1 Left,Circumferential Lower Leg o ABD pad Wound #2 Right,Circumferential Lower Leg o ABD pad Dressing Change Frequency Wound #1 Left,Circumferential Lower Leg o Dressing is to be changed Monday and Thursday. Wound #2  Right,Circumferential Lower Leg o Dressing is to be changed Monday and Thursday. NASTASSIA, BAZALDUA ([id number]) Follow-up Appointments Wound #1 Left,Circumferential Lower Leg o Return Appointment in 1 week. Wound #2 Right,Circumferential Lower Leg o Return Appointment in 1 week. Edema Control Wound #1 Left,Circumferential Lower Leg o Support Garment 20-30 mm/Hg pressure to: - PURCHASE SUPPORT GARMENTS AND BRING TO CLINIC NEXT WEEK Wound #2 Right,Circumferential Lower Leg o Support Garment 20-30 mm/Hg pressure to: - PURCHASE SUPPORT GARMENTS AND BRING TO CLINIC NEXT WEEK Additional  Orders / Instructions Wound #1 Left,Circumferential Lower Leg o Increase protein intake. o Increase protein intake. Wound #2 Right,Circumferential Lower Leg o Increase protein intake. o Increase protein intake. Home Health Wound #1 Left,Circumferential Lower Leg o Continue Home Health Visits o Home Health Nurse may visit PRN to address patientos wound care needs. o FACE TO FACE ENCOUNTER: MEDICARE and MEDICAID PATIENTS: I certify that this patient is under my care and that I had a face-to-face encounter that meets the physician face-to-face encounter requirements with this patient on this date. The encounter with the patient was in whole or in part for the following MEDICAL CONDITION: (primary reason for Home Healthcare) MEDICAL NECESSITY: I certify, that based on my findings, NURSING services are a medically necessary home health service. HOME BOUND STATUS: I certify that my clinical findings support that this patient is homebound (i.e., Due to illness or injury, pt requires aid of supportive devices such as crutches, cane, wheelchairs, walkers, the use of special transportation or the assistance of another person to leave their place of residence. There is a normal inability to leave the home and doing so requires considerable and taxing effort. Other absences are for medical  reasons / religious services and are infrequent or of short duration when for other reasons). o Please direct any NON-WOUND related issues/requests for orders to patient's Primary Care Physician Wound #2 Right,Circumferential Lower Leg o Continue Home Health Visits o Home Health Nurse may visit PRN to address patientos wound care needs. CRYSTLE, CARELLI ([id number]) o FACE TO FACE ENCOUNTER: MEDICARE and MEDICAID PATIENTS: I certify that this patient is under my care and that I had a face-to-face encounter that meets the physician face-to-face encounter requirements with this patient on this date. The encounter with the patient was in whole or in part for the following MEDICAL CONDITION: (primary reason for Home Healthcare) MEDICAL NECESSITY: I certify, that based on my findings, NURSING services are a medically necessary home health service. HOME BOUND STATUS: I certify that my clinical findings support that this patient is homebound (i.e., Due to illness or injury, pt requires aid of supportive devices such as crutches, cane, wheelchairs, walkers, the use of special transportation or the assistance of another person to leave their place of residence. There is a normal inability to leave the home and doing so requires considerable and taxing effort. Other absences are for medical reasons / religious services and are infrequent or of short duration when for other reasons). o Please direct any NON-WOUND related issues/requests for orders to patient's Primary Care Physician Notes LEFT LEG: LENGTH (41.7 CM), ANKLE (30.5CM), CALF (45CM) RIGHT LEG: LENGTH (41.5CM), ANKLE (33CM), CALF (39.5CM) Electronic Signature(s) Signed: 11/21/2015 3:26:59 PM By: Maureen Chatters Signed: 11/21/2015 3:35:28 PM By: Evlyn Kanner MD, FACS Entered By: Lucrezia Starch RN, Sendra on 11/21/2015 13:18:52 Upton, Levora Angel  ([id number]) -------------------------------------------------------------------------------- Problem List Details Patient Name: LAYALI, FREUND 11/21/2015 12:45 Date of Service: PM Medical Record [id number] Number: Patient Account Number: [id number] August 17, 1940 (74 y.o. 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 09/26/2015  Yes exposed E66.3 Overweight 09/26/2015 Yes L03.116 Cellulitis of left lower limb 10/03/2015 Yes L03.115 Cellulitis of right lower limb 10/03/2015 Yes Inactive Problems Resolved Problems Electronic Signature(s) Signed: 11/21/2015 1:14:31 PM By: Evlyn Kanner MD, FACS Menton, Levora Angel ([id number]) Entered By: Evlyn Kanner on 11/21/2015 13:14:31 Olson, Levora Angel ([id number]) -------------------------------------------------------------------------------- Progress Note Details Patient Name: CIENNA, DUMAIS 11/21/2015 12:45 Date of Service: PM Medical Record [id number] Number: Patient Account Number: [id number] 05-Jun-1941 (74 y.o. Treating RN: Leonard Downing Date of Birth/Sex: Female) Other Clinician: Primary Care Physician: Elizabeth Sauer Treating Evlyn Kanner Referring Physician: Elizabeth Sauer Physician/Extender: Tania Ade in Treatment: 8 Subjective Chief Complaint Information obtained from Patient Patient presents for treatment of an open ulcer due to venous insufficiency  which has been continuing this way for several months History of Present Illness (HPI) The following HPI elements were documented for the patient's wound: Location: bilateral lower extremity wounds Quality: Patient reports experiencing heaviness to affected area(s). Severity: Patient states wound are getting worse. Duration: Patient has had the wound for > 8 months prior to seeking treatment at the wound center Timing: Pain in wound is Intermittent (comes and goes Context: The wound would happen gradually Modifying Factors: Other treatment(s) tried include:Unna's boots and treatment by Dr. Gilda Crease at the vascular surgery office Associated Signs and Symptoms: Patient reports having increase discharge. 75 year old patient who is a poor historian but most of the history is being obtained from Dr. Levora Dredge her vascular surgeon who has been treating her for about 8 months to a year. the patient has a postphlebitic syndrome both lower extremities and has been under his care for a while. The ABIs done in 2013 were normal and he does not suspect any vascular arterial compromise as the resting ABI on the right was 1.03 on the left was 1.02. An ultrasound of the lower extremity bilaterally did not reveal any DVT or superficial thrombophlebitis bilaterally. Patient's initial problem started when she had a hematoma for right lower extremity due to being on Coumadin and then developed a lot of swelling and has been having Unna's boots for a long while. However recently her left lower extremity started swelling and oozing profusely and the patient did not want to have Unna's boots applied and hence Dr. Gilda Crease has referred her to [place] for possible failure of 4 layer wraps with Profore. past medical history does not include diabetes mellitus but she has a cardiac issue and has had some element of CHF. She has been treated with lymphedema pumps in the past. 10/03/2015 -- the patient's lower  extremities have been weeping very significantly and she has had a lot of swelling and redness since the last few days. She was seen by Dr. Gilda Crease this morning and he had recommended admission to the hospital but the patient was unwilling to have that done and came here for a second opinion. DJUANA, LITTLETON ([id number]) 10/10/2015 -- was admitted to the hospital March 27 to 10/07/2015 for bilateral cellulitis of lower extremities and also had chronic A. fib, chronic systolic heart failure, venous insufficiency of the legs and coronary artery disease. She was given 5 days of vancomycin and ampicillin and sulbactam. She was seen by Dr. Wyn Quaker and while she was inpatient and he recommended continuing with compression and oral antibiotics ( Augmentin ) at least for 2 weeks. 10/31/2015 -- her home health nurses have not yet ordered her juxta lites stockings and we will re-order them and talk to them if they have any questions 11/07/2015 --  the home health nurses have not got her juxta lites stockings but they have got her the 20-30 mm compression stockings the patient says is going to be difficult for her to [facility]. 11/21/2015 -- she has not been very compliant and not wearing her compression stockings nor has she been using her juxta lites or using her lymphedema pumps. She has a new place opened up again on her right lower extremity. Objective Constitutional Pulse regular. Respirations normal and unlabored. Afebrile. Vitals Time Taken: 12:49 PM, Height: 67 in, Weight: 189 lbs, BMI: 29.6, Temperature: 98.1 F, Pulse: 78 bpm, Blood Pressure: 106/58 mmHg. Eyes Nonicteric. Reactive to light. Ears, Nose, Mouth, and Throat Lips, teeth, and gums WNL.Marland Kitchen Moist mucosa without lesions. Neck supple and nontender. No palpable supraclavicular or cervical adenopathy. Normal sized without goiter. Respiratory WNL. No retractions.. Cardiovascular Pedal Pulses WNL. No clubbing, cyanosis or edema. Lymphatic No  adneopathy. No adenopathy. No adenopathy. Musculoskeletal Adexa without tenderness or enlargement.. Digits and nails w/o clubbing, cyanosis, infection, petechiae, ischemia, or inflammatory conditions.Marland Kitchen Psychiatric Roadcap, ERLEAN MEALOR ([id number]) Judgement and insight Intact.. No evidence of depression, anxiety, or agitation.. General Notes: the lymphedema has reappeared again and she's got an ulceration on her right lower extremity. On the left lower extremity she scored a few microperforations but there is no florid weeping. Integumentary (Hair, Skin) No suspicious lesions. No crepitus or fluctuance. No peri-wound warmth or erythema. No masses.. Wound #1 status is Open. Original cause of wound was Gradually Appeared. The wound is located on the Left,Circumferential Lower Leg. The wound measures 0.1cm length x 0.1cm width x 0.1cm depth; 0.008cm^2 area and 0.001cm^3 volume. There is no tunneling or undermining noted. There is a small amount of serous drainage noted. The wound margin is flat and intact. There is large (67-100%) pink granulation within the wound bed. There is no necrotic tissue within the wound bed. The periwound skin appearance exhibited: Dry/Scaly. Periwound temperature was noted as No Abnormality. Wound #2 status is Open. Original cause of wound was Gradually Appeared. The wound is located on the Right,Circumferential Lower Leg. The wound measures 0.1cm length x 0.1cm width x 0.1cm depth; 0.008cm^2 area and 0.001cm^3 volume. 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 Wound #2 Right,Circumferential Lower Leg: May Shower, gently pat wound dry prior to applying new dressing. Skin Barriers/Peri-Wound Care: Wound #1 Left,Circumferential Lower Leg: PEARLINA, FRIEDLY L. ([id number]) Moisturizing lotion Wound #2 Right,Circumferential Lower Leg: Moisturizing lotion Primary Wound Dressing: Wound #1 Left,Circumferential Lower Leg: Dry Gauze Wound #2 Right,Circumferential Lower Leg: Aquacel Ag Secondary Dressing: Wound #1 Left,Circumferential Lower Leg: ABD pad Wound #2 Right,Circumferential Lower Leg: ABD pad Dressing Change Frequency: Wound #1 Left,Circumferential Lower Leg: Dressing is to be changed Monday and Thursday. Wound #2 Right,Circumferential Lower Leg: Dressing is to be changed Monday and Thursday. Follow-up Appointments: Wound #1 Left,Circumferential Lower Leg: Return Appointment in 1 week. Wound #2 Right,Circumferential Lower Leg: Return Appointment in 1 week. Edema Control: Wound #1 Left,Circumferential Lower Leg: Support Garment 20-30 mm/Hg pressure to: - PURCHASE SUPPORT GARMENTS AND  BRING TO CLINIC NEXT WEEK Wound #2 Right,Circumferential Lower Leg: Support Garment 20-30 mm/Hg pressure to: - PURCHASE SUPPORT GARMENTS AND BRING TO CLINIC NEXT WEEK Additional Orders / Instructions: Wound #1 Left,Circumferential Lower Leg: Increase protein intake. Increase protein intake. Wound #2 Right,Circumferential Lower Leg: Increase protein intake. Increase protein intake. Home Health: Wound #1 Left,Circumferential Lower Leg: Continue Home Health Visits Home Health Nurse may visit PRN to address patient s wound care needs. FACE TO FACE ENCOUNTER: MEDICARE and MEDICAID PATIENTS: I certify that this patient is under my care and that I had a face-to-face encounter that meets the physician face-to-face  encounter requirements with this patient on this date. The encounter with the patient was in whole or in part for the following MEDICAL CONDITION: (primary reason for Home Healthcare) MEDICAL NECESSITY: I certify, that based on my findings, NURSING services are a medically necessary home health service. HOME BOUND STATUS: I certify that my clinical findings support that this patient is homebound (i.e., Due to illness or injury, pt requires aid of supportive devices such as crutches, cane, wheelchairs, walkers, the use of special transportation or the assistance of another person to leave their place of residence. There is a normal inability to leave the home and doing so requires considerable and taxing effort. Other absences are for medical reasons / religious services and are infrequent or of short duration when for other reasons). [name], Chanya L. ([id number]) Please direct any NON-WOUND related issues/requests for orders to patient's Primary Care Physician Wound #2 Right,Circumferential Lower Leg: Continue Home Health Visits Home Health Nurse may visit PRN to address patient s wound care needs. FACE TO FACE ENCOUNTER: MEDICARE and MEDICAID PATIENTS: I certify that this patient is under my care and that I had a face-to-face encounter that meets the physician face-to-face encounter requirements with this patient on this date. The encounter with the patient was in whole or in part for the following MEDICAL CONDITION: (primary reason for Home Healthcare) MEDICAL NECESSITY: I certify, that based on my findings, NURSING services are a medically necessary home health service. HOME BOUND STATUS: I certify that my clinical findings support that this patient is homebound (i.e., Due to illness or injury, pt requires aid of supportive devices such as crutches, cane, wheelchairs, walkers, the use of special transportation or the assistance of another person to leave their place of residence. There is  a normal inability to leave the home and doing so requires considerable and taxing effort. Other absences are for medical reasons / religious services and are infrequent or of short duration when for other reasons). Please direct any NON-WOUND related issues/requests for orders to patient's Primary Care Physician General Notes: LEFT LEG: LENGTH (41.7 CM), ANKLE (30.5CM), CALF (45CM) RIGHT LEG: LENGTH (41.5CM), ANKLE (33CM), CALF (39.5CM) I have recommended we go back to Silver alginate and a 4-layer Profore compression wrap. I have reiterated to her that she has to find a lymphedema pumps and use them twice a day for an hour each. She will also bring her juxta light compression stockings with her when she comes to see as next week. Electronic Signature(s) Signed: 11/21/2015 3:37:54 PM By: Evlyn KannerBritto, Alsie Younes MD, FACS Previous Signature: 11/21/2015 1:17:36 PM Version By: Evlyn KannerBritto, Nasir Bright MD, FACS Entered By: Evlyn KannerBritto, Barbara Ahart on 11/21/2015 15:37:54 Mcnamara, Levora AngelANNIE L. ([id number]) -------------------------------------------------------------------------------- SuperBill Details Patient Name: Safran, Francyne L. Date of Service: 11/21/2015 Medical Record Number: [id number] Patient Account Number: [id number] Date of Birth/Sex: 09-03-1940 (75 y.o. 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

## 2015-11-21 NOTE — Progress Notes (Signed)
[name], [name] ([id number]) Visit Report for 11/21/2015 Arrival Information Details Patient Name: [name], Cabella L. 11/21/2015 12:45 Date of Service: PM Medical Record [id number] Number: Patient Account Number: [id number] 1941/06/03 (75 y.o. Treating RN: Leonard Downingoseboro, Sendra Date of Birth/Sex: Female) Other Clinician: Primary Care Physician: Elizabeth SauerJones, Deanna Treating Britto, Errol Referring Physician: Elizabeth SauerJones, Deanna Physician/Extender: Tania AdeWeeks in Treatment: 8 Visit Information History Since Last Visit All ordered tests and consults were completed: No Patient Arrived: Dan HumphreysWalker Added or deleted any medications: No Arrival Time: 12:48 Any new allergies or adverse reactions: No Accompanied By: self Had a fall or experienced change in No Transfer Assistance: None activities of daily living that may affect Patient Identification Verified: Yes risk of falls: Secondary Verification Process Yes Signs or symptoms of abuse/neglect since last No Completed: visito Patient Has Alerts: Yes Hospitalized since last visit: No Has Compression in Place as Prescribed: Yes Pain Present Now: No Electronic Signature(s) Signed: 11/21/2015 3:26:59 PM By: Lucrezia Starchoseboro, RN, Sendra Entered By: Lucrezia Starchoseboro, RN, Sendra on 11/21/2015 12:48:24 [name], Marvena L. ([id number]) -------------------------------------------------------------------------------- Encounter Discharge Information Details Patient Name: [name], Kerriann L. 11/21/2015 12:45 Date of Service: PM Medical Record [id number] Number: Patient Account Number: [id number] 1941/06/03 (74 y.o. Treating RN: Leonard Downingoseboro, Sendra Date of Birth/Sex: Female) Other Clinician: Primary Care Physician: Elizabeth SauerJones, Deanna Treating Evlyn KannerBritto, Errol Referring Physician: Elizabeth SauerJones, Deanna Physician/Extender: Tania AdeWeeks in Treatment: 8 Encounter Discharge Information Items Facility Notification Discharge Pain Level: 0 Facility Type: Home Health Discharge Condition: Stable Orders Sent:  Yes Ambulatory Status: Walker Discharge Destination: Home Transportation: Private Auto Accompanied By: self Schedule Follow-up Appointment: Yes Medication Reconciliation completed and provided to Patient/Care Yes Wilmore Holsomback: Provided on Clinical Summary of Care: 11/21/2015 Form Type Recipient Paper Patient AS Electronic Signature(s) Signed: 11/21/2015 3:26:59 PM By: Lucrezia Starchoseboro, RN, Sendra Previous Signature: 11/21/2015 1:34:16 PM Version By: Gwenlyn PerkingMoore, Shelia Entered By: Lucrezia Starchoseboro, RN, Sendra on 11/21/2015 13:38:30 Marschall, Levora AngelANNIE L. ([id number]) -------------------------------------------------------------------------------- Lower Extremity Assessment Details Patient Name: [name], Mahitha L. 11/21/2015 12:45 Date of Service: PM Medical Record [id number] Number: Patient Account Number: [id number] 1941/06/03 (74 y.o. Treating RN: Leonard Downingose[place], Sendra Date of Birth/Sex: Female) Other Clinician: Primary Care Physician: Elizabeth SauerJones, Deanna Treating Britto, Errol Referring Physician: Elizabeth SauerJones, Deanna Physician/Extender: Tania AdeWeeks in Treatment: 8 Edema Assessment Assessed: [Left: No] [Right: No] E[Left: dema] [Right: :] Calf Left: Right: Point of Measurement: 34 cm From Medial Instep 44.8 cm 39 cm Ankle Left: Right: Point of Measurement: 11 cm From Medial Instep 31 cm 33.5 cm Vascular Assessment Pulses: Posterior Tibial Dorsalis Pedis Palpable: [Left:Yes] [Right:Yes] Extremity colors, hair growth, and conditions: Extremity Color: [Left:Red] [Right:Red] Hair Growth on Extremity: [Left:No] [Right:No] Temperature of Extremity: [Left:Warm] [Right:Warm] Capillary Refill: [Left:< 3 seconds] [Right:< 3 seconds] Toe Nail Assessment Left: Right: Thick: No No Discolored: No No Deformed: No No Improper Length and Hygiene: No No Electronic Signature(s) Signed: 11/21/2015 3:26:59 PM By: Lucrezia Starchoseboro, RN, Sendra Entered By: Lucrezia Starchoseboro, RN, Sendra on 11/21/2015 13:04:02 Gerlich, Levora AngelANNIE L. ([id number]) [name], Vi  L. ([id number]) -------------------------------------------------------------------------------- Pain Assessment Details Patient Name: [name], Kolbee L. 11/21/2015 12:45 Date of Service: PM Medical Record [id number] Number: Patient Account Number: [id number] 1941/06/03 (74 y.o. Treating RN: Leonard Downingose[place], Sendra Date of Birth/Sex: Female) Other Clinician: Primary Care Physician: Elizabeth SauerJones, Deanna Treating Evlyn KannerBritto, Errol Referring Physician: Elizabeth SauerJones, Deanna Physician/Extender: Tania AdeWeeks in Treatment: 8 Active Problems Location of Pain Severity and Description of Pain Patient Has Paino No Site Locations Pain Management and Medication Current Pain Management: Electronic Signature(s) Signed: 11/21/2015 3:26:59 PM By: Lucrezia Starchoseboro, RN, Sendra Entered By: Lucrezia Starchoseboro, RN, Sendra on 11/21/2015 12:48:32 Angevine, Levora AngelANNIE L. ([id number]) --------------------------------------------------------------------------------  Patient/Caregiver Education Details Patient Name: JILLAYNE, WITTE 11/21/2015 12:45 Date of Service: PM Medical Record [id number] Number: Patient Account Number: [id number] [birth year] (75 y.o. Treating RN: Leonard Downing Date of Birth/Gender: Female) Other Clinician: Primary Care Physician: Elizabeth Sauer Treating Evlyn Kanner Referring Physician: Elizabeth Sauer Physician/Extender: Tania Ade in Treatment: 8 Education Assessment Education Provided To: Patient Education Topics Provided Venous: Handouts: Controlling Swelling with Compression Stockings Methods: Explain/Verbal Responses: State content correctly Electronic Signature(s) Signed: 11/21/2015 3:26:59 PM By: Lucrezia Starch, RN, Sendra Entered By: Lucrezia Starch RN, Sendra on 11/21/2015 13:38:42 Pugsley, Levora Angel ([id number]) -------------------------------------------------------------------------------- Wound Assessment Details Patient Name: SHOLONDA, JOBST 11/21/2015 12:45 Date of Service: PM Medical Record [id number] Number: Patient  Account Number: [id number] Mar 28, 1941 (74 y.o. Treating RN: Leonard Downing Date of Birth/Sex: Female) Other Clinician: Primary Care Physician: Elizabeth Sauer Treating Britto, Errol Referring Physician: Elizabeth Sauer Physician/Extender: Tania Ade in Treatment: 8 Wound Status Wound Number: 1 Primary Lymphedema Etiology: Wound Location: Left Lower Leg - Circumfernential Wound Open Status: Wounding Event: Gradually Appeared Comorbid Cataracts, Deep Vein Thrombosis, Date Acquired: 09/07/2014 History: Hypertension, Peripheral Venous Weeks Of Treatment: 8 Disease Clustered Wound: No Photos Photo Uploaded By: Lucrezia Starch, RN, Rosalio Macadamia on 11/21/2015 15:25:30 Wound Measurements Length: (cm) 0.1 Width: (cm) 0.1 Depth: (cm) 0.1 Area: (cm) 0.008 Volume: (cm) 0.001 % Reduction in Area: 100% % Reduction in Volume: 100% Tunneling: No Undermining: No Wound Description Classification: Partial Thickness Foul Odor After Wound Margin: Flat and Intact Exudate Amount: Small Exudate Type: Serous Exudate Color: amber Cleansing: No Wound Bed Granulation Amount: Large (67-100%) Granulation Quality: Pink Richeson, Jayana L. ([id number]) Necrotic Amount: None Present (0%) Periwound Skin Texture Texture Color No Abnormalities Noted: No No Abnormalities Noted: No Moisture Temperature / Pain No Abnormalities Noted: No Temperature: No Abnormality Dry / Scaly: Yes Wound Preparation Ulcer Cleansing: Other: SOAP AND WATER, Topical Anesthetic Applied: None Treatment Notes Wound #1 (Left, Circumferential Lower Leg) 1. Cleansed with: Cleanse wound with antibacterial soap and water 4. Dressing Applied: Aquacel Ag 5. Secondary Dressing Applied ABD Pad Dry Gauze 7. Secured with 4 Layer Compression System - Bilateral Notes referral for compression stocking Electronic Signature(s) Signed: 11/21/2015 3:26:59 PM By: Lucrezia Starch, RN, Sendra Entered By: Lucrezia Starch RN, Sendra on 11/21/2015  13:04:42 Allemand, Levora Angel ([id number]) -------------------------------------------------------------------------------- Wound Assessment Details Patient Name: WILLOW, SHIDLER 11/21/2015 12:45 Date of Service: PM Medical Record [id number] Number: Patient Account Number: [id number] 02/18/1941 (74 y.o. Treating RN: Leonard Downing Date of Birth/Sex: Female) Other Clinician: Primary Care Physician: Elizabeth Sauer Treating Britto, Errol Referring Physician: Elizabeth Sauer Physician/Extender: Tania Ade in Treatment: 8 Wound Status Wound Number: 2 Primary Etiology: Lymphedema Wound Location: Right, Circumferential Lower Wound Status: Open Leg Wounding Event: Gradually Appeared Date Acquired: 09/07/2014 Weeks Of Treatment: 8 Clustered Wound: No Wound Measurements Length: (cm) 0.1 Width: (cm) 0.1 Depth: (cm) 0.1 Area: (cm) 0.008 Volume: (cm) 0.001 % Reduction in Area: 100% % Reduction in Volume: 100% Wound Description Classification: Partial Thickness Periwound Skin Texture Texture Color No Abnormalities Noted: No No Abnormalities Noted: No Moisture No Abnormalities Noted: No Treatment Notes Wound #2 (Right, Circumferential Lower Leg) 1. Cleansed with: Cleanse wound with antibacterial soap and water 4. Dressing Applied: Aquacel Ag 5. Secondary Dressing Applied ABD Pad Dry Gauze 7. Secured with 4 Layer Compression System - Bilateral ALDORA, PERMAN ([id number]) Notes referral for compression stocking Electronic Signature(s) Signed: 11/21/2015 3:26:59 PM By: Lucrezia Starch RN, Sendra Entered By: Lucrezia Starch RN, Sendra on 11/21/2015 15:26:07 [name] ([id number]) -------------------------------------------------------------------------------- Vitals Details Patient Name: RACHELANN, ENLOE 11/21/2015 12:45 Date of  Service: PM Medical Record [id number] Number: Patient Account Number: [id number] 1941/02/05 (75 y.o. Treating RN: Leonard Downing Date of  Birth/Sex: Female) Other Clinician: Primary Care Physician: Elizabeth Sauer Treating Britto, Errol Referring Physician: Elizabeth Sauer Physician/Extender: Tania Ade in Treatment: 8 Vital Signs Time Taken: 12:49 Temperature (F): 98.1 Height (in): 67 Pulse (bpm): 78 Weight (lbs): 189 Blood Pressure (mmHg): 106/58 Body Mass Index (BMI): 29.6 Reference Range: 80 - 120 mg / dl Electronic Signature(s) Signed: 11/21/2015 3:26:59 PM By: Lucrezia Starch RN, Sendra Entered By: Lucrezia Starch RN, Sendra on 11/21/2015 12:49:59

## 2015-11-24 DIAGNOSIS — I5022 Chronic systolic (congestive) heart failure: Secondary | ICD-10-CM | POA: Diagnosis not present

## 2015-11-24 DIAGNOSIS — I872 Venous insufficiency (chronic) (peripheral): Secondary | ICD-10-CM | POA: Diagnosis not present

## 2015-11-24 DIAGNOSIS — I340 Nonrheumatic mitral (valve) insufficiency: Secondary | ICD-10-CM | POA: Diagnosis not present

## 2015-11-24 DIAGNOSIS — I110 Hypertensive heart disease with heart failure: Secondary | ICD-10-CM | POA: Diagnosis not present

## 2015-11-24 DIAGNOSIS — M159 Polyosteoarthritis, unspecified: Secondary | ICD-10-CM | POA: Diagnosis not present

## 2015-11-24 DIAGNOSIS — Z9181 History of falling: Secondary | ICD-10-CM | POA: Diagnosis not present

## 2015-11-24 DIAGNOSIS — D509 Iron deficiency anemia, unspecified: Secondary | ICD-10-CM | POA: Diagnosis not present

## 2015-11-24 DIAGNOSIS — I272 Other secondary pulmonary hypertension: Secondary | ICD-10-CM | POA: Diagnosis not present

## 2015-11-24 DIAGNOSIS — L97811 Non-pressure chronic ulcer of other part of right lower leg limited to breakdown of skin: Secondary | ICD-10-CM | POA: Diagnosis not present

## 2015-11-24 DIAGNOSIS — Z87891 Personal history of nicotine dependence: Secondary | ICD-10-CM | POA: Diagnosis not present

## 2015-11-24 DIAGNOSIS — I482 Chronic atrial fibrillation: Secondary | ICD-10-CM | POA: Diagnosis not present

## 2015-11-28 ENCOUNTER — Encounter: Payer: Medicare Other | Admitting: Surgery

## 2015-11-28 DIAGNOSIS — I2510 Atherosclerotic heart disease of native coronary artery without angina pectoris: Secondary | ICD-10-CM | POA: Diagnosis not present

## 2015-11-28 DIAGNOSIS — I890 Lymphedema, not elsewhere classified: Secondary | ICD-10-CM | POA: Diagnosis not present

## 2015-11-28 DIAGNOSIS — I4891 Unspecified atrial fibrillation: Secondary | ICD-10-CM | POA: Diagnosis not present

## 2015-11-28 DIAGNOSIS — L03115 Cellulitis of right lower limb: Secondary | ICD-10-CM | POA: Diagnosis not present

## 2015-11-28 DIAGNOSIS — I5022 Chronic systolic (congestive) heart failure: Secondary | ICD-10-CM | POA: Diagnosis not present

## 2015-11-28 DIAGNOSIS — L97821 Non-pressure chronic ulcer of other part of left lower leg limited to breakdown of skin: Secondary | ICD-10-CM | POA: Diagnosis not present

## 2015-11-28 DIAGNOSIS — L97212 Non-pressure chronic ulcer of right calf with fat layer exposed: Secondary | ICD-10-CM | POA: Diagnosis not present

## 2015-11-28 DIAGNOSIS — L97811 Non-pressure chronic ulcer of other part of right lower leg limited to breakdown of skin: Secondary | ICD-10-CM | POA: Diagnosis not present

## 2015-11-28 DIAGNOSIS — I87031 Postthrombotic syndrome with ulcer and inflammation of right lower extremity: Secondary | ICD-10-CM | POA: Diagnosis not present

## 2015-11-28 DIAGNOSIS — L97222 Non-pressure chronic ulcer of left calf with fat layer exposed: Secondary | ICD-10-CM | POA: Diagnosis not present

## 2015-11-28 DIAGNOSIS — Z86718 Personal history of other venous thrombosis and embolism: Secondary | ICD-10-CM | POA: Diagnosis not present

## 2015-11-28 DIAGNOSIS — I110 Hypertensive heart disease with heart failure: Secondary | ICD-10-CM | POA: Diagnosis not present

## 2015-11-28 DIAGNOSIS — Z87891 Personal history of nicotine dependence: Secondary | ICD-10-CM | POA: Diagnosis not present

## 2015-11-28 DIAGNOSIS — I87032 Postthrombotic syndrome with ulcer and inflammation of left lower extremity: Secondary | ICD-10-CM | POA: Diagnosis not present

## 2015-11-28 DIAGNOSIS — I872 Venous insufficiency (chronic) (peripheral): Secondary | ICD-10-CM | POA: Diagnosis not present

## 2015-11-28 DIAGNOSIS — L03116 Cellulitis of left lower limb: Secondary | ICD-10-CM | POA: Diagnosis not present

## 2015-11-28 NOTE — Progress Notes (Addendum)
[name], [name] ([id number]) Visit Report for 11/28/2015 Chief Complaint Document Details Patient Name: [name], [name] 11/28/2015 12:45 Date of Service: PM Medical Record [id number] Number: Patient Account Number: [id number] 30-Apr-1941 (75 y.o. Treating RN: Huel Coventry Date of Birth/Sex: Female) Other Clinician: Primary Care Physician: Elizabeth Sauer Treating Evlyn Kanner Referring Physician: Elizabeth Sauer Physician/Extender: Tania Ade in Treatment: 9 Information Obtained from: Patient Chief Complaint Patient presents for treatment of an open ulcer due to venous insufficiency which has been continuing this way for several months Electronic Signature(s) Signed: 11/28/2015 1:10:40 PM By: Evlyn Kanner MD, FACS Entered By: Evlyn Kanner on 11/28/2015 13:10:40 [name] ([id number]) -------------------------------------------------------------------------------- HPI Details Patient Name: [name], [name] 11/28/2015 12:45 Date of Service: PM Medical Record [id number] Number: Patient Account Number: [id number] 14-Sep-1940 (75 y.o. Treating RN: Huel Coventry Date of Birth/Sex: Female) Other Clinician: Primary Care Physician: Elizabeth Sauer Treating Evlyn Kanner Referring Physician: Elizabeth Sauer Physician/Extender: Tania Ade in Treatment: 9 History of Present Illness Location: bilateral lower extremity wounds Quality: Patient reports experiencing heaviness to affected area(s). Severity: Patient states wound are getting worse. Duration: Patient has had the wound for > 8 months prior to seeking treatment at the wound center Timing: Pain in wound is Intermittent (comes and goes Context: The wound would happen gradually Modifying Factors: Other treatment(s) tried include:Unna's boots and treatment by Dr. Gilda Crease at the vascular surgery office Associated Signs and Symptoms: Patient reports having increase discharge. HPI Description: 75 year old patient who is a poor historian but most  of the history is being obtained from Dr. Levora Dredge her vascular surgeon who has been treating her for about 8 months to a year. the patient has a postphlebitic syndrome both lower extremities and has been under his care for a while. The ABIs done in 2013 were normal and he does not suspect any vascular arterial compromise as the resting ABI on the right was 1.03 on the left was 1.02. An ultrasound of the lower extremity bilaterally did not reveal any DVT or superficial thrombophlebitis bilaterally. Patient's initial problem started when she had a hematoma for right lower extremity due to being on Coumadin and then developed a lot of swelling and has been having Unna's boots for a long while. However recently her left lower extremity started swelling and oozing profusely and the patient did not want to have Unna's boots applied and hence Dr. Gilda Crease has referred her to [place] for possible failure of 4 layer wraps with Profore. past medical history does not include diabetes mellitus but she has a cardiac issue and has had some element of CHF. She has been treated with lymphedema pumps in the past. 10/03/2015 -- the patient's lower extremities have been weeping very significantly and she has had a lot of swelling and redness since the last few days. She was seen by Dr. Gilda Crease this morning and he had recommended admission to the hospital but the patient was unwilling to have that done and came here for a second opinion. 10/10/2015 -- was admitted to the hospital March 27 to 10/07/2015 for bilateral cellulitis of lower extremities and also had chronic A. fib, chronic systolic heart failure, venous insufficiency of the legs and coronary artery disease. She was given 5 days of vancomycin and ampicillin and sulbactam. She was seen by Dr. Wyn Quaker and while she was inpatient and he recommended continuing with compression and oral antibiotics ( Augmentin ) at least for 2 weeks. 10/31/2015 -- her home  health nurses have not yet ordered her juxta lites stockings  and we will re-order them and talk to them if they have any questions 11/07/2015 -- the home health nurses have not got her juxta lites stockings but they have got her the 20-30 mm compression stockings the patient says is going to be difficult for her to [facility]. [name], [name] ([id number]) 11/21/2015 -- she has not been very compliant and not wearing her compression stockings nor has she been using her juxta lites or using her lymphedema pumps. She has a new place opened up again on her right lower extremity. Electronic Signature(s) Signed: 11/28/2015 1:10:49 PM By: Evlyn Kanner MD, FACS Entered By: Evlyn Kanner on 11/28/2015 13:10:49 [name], [name] ([id number]) -------------------------------------------------------------------------------- Physical Exam Details Patient Name: [name] 11/28/2015 12:45 Date of Service: PM Medical Record [id number] Number: Patient Account Number: [id number] 10-Oct-1940 (75 y.o. Treating RN: Huel Coventry Date of Birth/Sex: Female) Other Clinician: Primary Care Physician: Elizabeth Sauer Treating Evlyn Kanner Referring Physician: Elizabeth Sauer Physician/Extender: Tania Ade in Treatment: 9 Constitutional . Pulse regular. Respirations normal and unlabored. Afebrile. . Eyes Nonicteric. Reactive to light. Ears, Nose, Mouth, and Throat Lips, teeth, and gums WNL.Marland Kitchen Moist mucosa without lesions. Neck supple and nontender. No palpable supraclavicular or cervical adenopathy. Normal sized without goiter. Respiratory WNL. No retractions.. Cardiovascular Pedal Pulses WNL. No clubbing, cyanosis or edema. Lymphatic No adneopathy. No adenopathy. No adenopathy. Musculoskeletal Adexa without tenderness or enlargement.. Digits and nails w/o clubbing, cyanosis, infection, petechiae, ischemia, or inflammatory conditions.. Integumentary (Hair, Skin) No suspicious lesions. No crepitus or fluctuance.  No peri-wound warmth or erythema. No masses.Marland Kitchen Psychiatric Judgement and insight Intact.. No evidence of depression, anxiety, or agitation.. Notes the lymphedema is much better this week and using the 4 layer compression has helped her a lot. Few of the ulcerations have healed and there a few more open spots. Electronic Signature(s) Signed: 11/28/2015 1:11:40 PM By: Evlyn Kanner MD, FACS Entered By: Evlyn Kanner on 11/28/2015 13:11:39 [name], [name] ([id number]) -------------------------------------------------------------------------------- Physician Orders Details Patient Name: JUSTENE, JENSEN 11/28/2015 12:45 Date of Service: PM Medical Record [id number] Number: Patient Account Number: [id number] 07-Dec-1940 (74 y.o. 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 Wound #2 Right,Circumferential Lower Leg o Clean wound with wound cleanser. o May Shower, gently pat wound dry prior to applying new dressing. Skin Barriers/Peri-Wound Care Wound #1 Left,Circumferential Lower Leg o Moisturizing lotion Wound #2 Right,Circumferential Lower Leg o Moisturizing lotion Primary Wound  Dressing Wound #1 Left,Circumferential Lower Leg o Aquacel Ag Wound #2 Right,Circumferential Lower Leg o Aquacel Ag Secondary Dressing Wound #1 Left,Circumferential Lower Leg Deshler, Danice L. ([id number]) o ABD pad Wound #2 Right,Circumferential Lower Leg o ABD pad Dressing Change Frequency Wound #1 Left,Circumferential Lower Leg o Dressing is to be changed Monday and Thursday. Wound #2 Right,Circumferential Lower Leg o Dressing is to be changed Monday and Thursday. Follow-up Appointments Wound #1 Left,Circumferential Lower Leg   o Return Appointment in 1 week. Wound #2 Right,Circumferential Lower Leg o Return Appointment in 1 week. Edema Control Wound #1 Left,Circumferential Lower Leg o 4 Layer Compression System - Bilateral o Support Garment 20-30 mm/Hg pressure to: - PURCHASE SUPPORT GARMENTS AND BRING TO CLINIC NEXT WEEK Wound #2 Right,Circumferential Lower Leg o 4 Layer Compression System - Bilateral o Support Garment 20-30 mm/Hg pressure to: - PURCHASE SUPPORT GARMENTS AND BRING TO CLINIC NEXT WEEK Additional Orders / Instructions Wound #1 Left,Circumferential Lower Leg o Increase protein intake. o Increase protein intake. Wound #2 Right,Circumferential Lower Leg o Increase protein intake. o Increase protein intake. Home Health Wound #1 Left,Circumferential Lower Leg o Continue Home Health Visits o Home Health Nurse may visit PRN to address patientos wound care needs. - [facility] o FACE TO FACE ENCOUNTER: MEDICARE and MEDICAID PATIENTS: I certify that this patient is under my care and that I had a face-to-face encounter that meets the physician face-to-face encounter requirements with this patient on this date. The encounter with the patient was in whole or in part for the following MEDICAL CONDITION: (primary reason for Home Healthcare) [name], [name] ([id number]) MEDICAL NECESSITY: I certify, that based on my findings, NURSING  services are a medically necessary home health service. HOME BOUND STATUS: I certify that my clinical findings support that this patient is homebound (i.e., Due to illness or injury, pt requires aid of supportive devices such as crutches, cane, wheelchairs, walkers, the use of special transportation or the assistance of another person to leave their place of residence. There is a normal inability to leave the home and doing so requires considerable and taxing effort. Other absences are for medical reasons / religious services and are infrequent or of short duration when for other reasons). o Please direct any NON-WOUND related issues/requests for orders to patient's Primary Care Physician Wound #2 Right,Circumferential Lower Leg o Continue Home Health Visits - [facility] o Home Health Nurse may visit PRN to address patientos wound care needs. o FACE TO FACE ENCOUNTER: MEDICARE and MEDICAID PATIENTS: I certify that this patient is under my care and that I had a face-to-face encounter that meets the physician face-to-face encounter requirements with this patient on this date. The encounter with the patient was in whole or in part for the following MEDICAL CONDITION: (primary reason for Home Healthcare) MEDICAL NECESSITY: I certify, that based on my findings, NURSING services are a medically necessary home health service. HOME BOUND STATUS: I certify that my clinical findings support that this patient is homebound (i.e., Due to illness or injury, pt requires aid of supportive devices such as crutches, cane, wheelchairs, walkers, the use of special transportation or the assistance of another person to leave their place of residence. There is a normal inability to leave the home and doing so requires considerable and taxing effort. Other absences are for medical reasons / religious services and are infrequent or of short duration when for other reasons). o Please direct any NON-WOUND  related issues/requests for orders to patient's Primary Care Physician Electronic Signature(s) Signed: 11/28/2015 3:39:56 PM By: Evlyn Kanner MD, FACS Signed: 11/29/2015 4:50:53 PM By: Alejandro Mulling Entered By: Alejandro Mulling on 11/28/2015 13:22:26 [name], [name] ([id number]) -------------------------------------------------------------------------------- Problem List Details Patient Name: [name], [name] 11/28/2015 12:45 Date of Service: PM Medical Record [id number] Number: Patient Account Number: [id number] October 09, 1940 (74 y.o. 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 with fat layer 09/26/2015 Yes exposed L97.212 Non-pressure chronic ulcer of right calf with fat layer 09/26/2015 Yes exposed E66.3 Overweight 09/26/2015 Yes Inactive Problems Resolved Problems ICD-10 Code Description Active Date Resolved Date L03.115 Cellulitis of right lower limb 10/03/2015 10/03/2015 L03.116 Cellulitis of left lower limb 10/03/2015 10/03/2015 RANATA, LAUGHERY ([id number]) Electronic Signature(s) Signed: 11/28/2015 1:13:09 PM By: Evlyn Kanner MD, FACS Previous Signature: 11/28/2015 1:10:29 PM Version By: Evlyn Kanner MD, FACS Entered By: Evlyn Kanner on 11/28/2015 13:13:09 [name], [name] ([id number]) -------------------------------------------------------------------------------- Progress Note Details Patient Name: [name], [name] 11/28/2015 12:45 Date of Service: PM Medical  Record [id number] Number: Patient Account Number: [id number] Jun 02, 1941 (74 y.o. Treating RN: Huel Coventry Date of Birth/Sex: Female) Other Clinician: Primary Care Physician: Elizabeth Sauer Treating Evlyn Kanner Referring Physician: Elizabeth Sauer Physician/Extender: Tania Ade in Treatment: 9 Subjective Chief Complaint Information obtained from Patient Patient presents for treatment of an open ulcer due to venous insufficiency which has been continuing this way for several months History of Present Illness (HPI) The following HPI elements were documented for the patient's wound: Location: bilateral lower extremity wounds Quality: Patient reports experiencing heaviness to affected area(s). Severity: Patient states wound are getting worse. Duration: Patient has had the wound for > 8 months prior to seeking treatment at the wound center Timing: Pain in wound is Intermittent (comes and goes Context: The wound would happen gradually Modifying Factors: Other treatment(s) tried include:Unna's boots and treatment by Dr. Gilda Crease at the vascular surgery office Associated Signs and Symptoms: Patient reports having increase discharge. 75 year old patient who is a poor historian but most of the history is being obtained from Dr. Levora Dredge her vascular surgeon who has been treating her for about 8 months to a year. the patient has a postphlebitic syndrome both lower extremities and has been under his care for a while. The ABIs done in 2013 were normal and he does not suspect any vascular arterial compromise as the resting ABI on the right was 1.03 on the left was 1.02. An ultrasound of the lower extremity bilaterally did not reveal any DVT or superficial thrombophlebitis bilaterally. Patient's initial problem started when she had a hematoma for right lower extremity due to being on Coumadin and then developed a lot of swelling and has been having Unna's boots for a long while. However recently her  left lower extremity started swelling and oozing profusely and the patient did not want to have Unna's boots applied and hence Dr. Gilda Crease has referred her to [place] for possible failure of 4 layer wraps with Profore. past medical history does not include diabetes mellitus but she has a cardiac issue and has had some element of CHF. She has been treated with lymphedema pumps in the past. 10/03/2015 -- the patient's lower extremities have been weeping very significantly and she has had a lot of swelling and redness since the last few days. She was seen by Dr. Gilda Crease this morning and he had recommended admission to the hospital but the patient was unwilling to have that done and came here for a second opinion. [name], [name] ([id number]) 10/10/2015 -- was admitted to the hospital March 27 to 10/07/2015 for bilateral cellulitis of lower extremities and also had chronic A. fib, chronic systolic  heart failure, venous insufficiency of the legs and coronary artery disease. She was given 5 days of vancomycin and ampicillin and sulbactam. She was seen by Dr. Wyn Quakerew and while she was inpatient and he recommended continuing with compression and oral antibiotics ( Augmentin ) at least for 2 weeks. 10/31/2015 -- her home health nurses have not yet ordered her juxta lites stockings and we will re-order them and talk to them if they have any questions 11/07/2015 -- the home health nurses have not got her juxta lites stockings but they have got her the 20-30 mm compression stockings the patient says is [facility] 11/21/2015 -- she has not been very compliant and not wearing her compression stockings nor has she been using her juxta lites or using her lymphedema pumps. She has a new place opened up again on her right lower extremity. Objective Constitutional Pulse regular. Respirations normal and unlabored. Afebrile. Vitals Time Taken: 12:28 PM, Height: 67 in, Weight: 189 lbs, BMI:  29.6, Temperature: 97.8 F, Pulse: 89 bpm, Respiratory Rate: 18 breaths/min, Blood Pressure: 142/88 mmHg. Eyes Nonicteric. Reactive to light. Ears, Nose, Mouth, and Throat Lips, teeth, and gums WNL.Marland Kitchen. Moist mucosa without lesions. Neck supple and nontender. No palpable supraclavicular or cervical adenopathy. Normal sized without goiter. Respiratory WNL. No retractions.. Cardiovascular Pedal Pulses WNL. No clubbing, cyanosis or edema. Lymphatic No adneopathy. No adenopathy. No adenopathy. Musculoskeletal Adexa without tenderness or enlargement.. Digits and nails w/o clubbing, cyanosis, infection, petechiae, ischemia, or inflammatory conditions.Marland Kitchen. Psychiatric Hudlow, Marie AngelNNIE L. ([id number]) Judgement and insight Intact.. No evidence of depression, anxiety, or agitation.. General Notes: the lymphedema is much better this week and using the 4 layer compression has helped her a lot. Few of the ulcerations have healed and there a few more open spots. Integumentary (Hair, Skin) No suspicious lesions. No crepitus or fluctuance. No peri-wound warmth or erythema. No masses.. Wound #1 status is Open. Original cause of wound was Gradually Appeared. The wound is located on the Left,Circumferential Lower Leg. The wound measures 0.5cm length x 0.5cm width x 0.1cm depth; 0.196cm^2 area and 0.02cm^3 volume. There is no tunneling or undermining noted. There is a small amount of serous drainage noted. The wound margin is flat and intact. There is large (67-100%) pink granulation within the wound bed. There is no necrotic tissue within the wound bed. The periwound skin appearance exhibited: Dry/Scaly. Periwound temperature was noted as No Abnormality. Wound #2 status is Open. Original cause of wound was Gradually Appeared. The wound is located on the Right,Circumferential Lower Leg. The wound measures 0.5cm length x 0.5cm width x 0.5cm depth; 0.196cm^2 area and 0.098cm^3 volume. The wound is limited to skin  breakdown. There is no tunneling noted. There is a large amount of serous drainage noted. The wound margin is flat and intact. There is small (1-33%) red granulation within the wound bed. There is a large (67-100%) amount of necrotic tissue within the wound bed including Eschar and Adherent Slough. The periwound skin appearance exhibited: Localized Edema, Moist. Periwound temperature was noted as No Abnormality. The periwound has tenderness on palpation. 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 [name], [name] ([id number]) Wound #2 Right,Circumferential Lower Leg: Clean wound with wound cleanser. May Shower, gently pat wound dry prior to applying new dressing. Skin Barriers/Peri-Wound Care: Wound #1 Left,Circumferential Lower Leg: Moisturizing lotion Wound #2 Right,Circumferential Lower Leg: Moisturizing lotion Primary Wound Dressing: Wound #1 Left,Circumferential Lower Leg: Aquacel Ag Wound #2 Right,Circumferential Lower Leg: Aquacel Ag Secondary Dressing: Wound #1 Left,Circumferential Lower Leg: ABD pad Wound #2 Right,Circumferential Lower Leg: ABD pad Dressing Change Frequency: Wound #1 Left,Circumferential Lower Leg: Dressing is to be changed Monday and Thursday. Wound #2 Right,Circumferential Lower Leg: Dressing is to be changed Monday and Thursday. Follow-up Appointments: Wound #1 Left,Circumferential Lower Leg: Return Appointment in 1 week. Wound #2 Right,Circumferential  Lower Leg: Return Appointment in 1 week. Edema Control: Wound #1 Left,Circumferential Lower Leg: 4 Layer Compression System - Bilateral Support Garment 20-30 mm/Hg pressure to: - PURCHASE SUPPORT GARMENTS AND BRING TO CLINIC NEXT WEEK Wound #2 Right,Circumferential Lower Leg: 4 Layer Compression System - Bilateral Support Garment 20-30 mm/Hg pressure to: - PURCHASE SUPPORT GARMENTS AND BRING TO CLINIC NEXT WEEK Additional Orders / Instructions: Wound #1 Left,Circumferential Lower Leg: Increase protein intake. Increase protein intake. Wound #2 Right,Circumferential Lower Leg: Increase protein intake. Increase protein intake. Home Health: Wound #1 Left,Circumferential Lower Leg: Continue Home Health Visits Home Health Nurse may visit PRN to address patient s wound care needs. - [facility] FACE TO FACE ENCOUNTER: MEDICARE and MEDICAID PATIENTS: I certify that this patient is under my care and that I had a face-to-face encounter that meets the physician face-to-face encounter requirements with this patient on this date. The encounter with the patient was in whole or in part for the PETINA, MURASKI. ([id number]) following MEDICAL CONDITION: (primary reason for Home Healthcare) MEDICAL NECESSITY: I certify, that based on my findings, NURSING services are a medically necessary home health service. HOME BOUND STATUS: I certify that my clinical findings support that this patient is homebound (i.e., Due to illness or injury, pt requires aid of supportive devices such as crutches, cane, wheelchairs, walkers, the use of special transportation or the assistance of another person to leave their place of residence. There is a normal inability to leave the home and doing so requires considerable and taxing effort. Other absences are for medical reasons / religious services and are infrequent or of short duration when for other reasons). Please direct any NON-WOUND related issues/requests for orders to  patient's Primary Care Physician Wound #2 Right,Circumferential Lower Leg: Continue Home Health Visits - [facility] Nurse may visit PRN to address patient s wound care needs. FACE TO FACE ENCOUNTER: MEDICARE and MEDICAID PATIENTS: I certify that this patient is under my care and that I had a face-to-face encounter that meets the physician face-to-face encounter requirements with this patient on this date. The encounter with the patient was in whole or in part for the following MEDICAL CONDITION: (primary reason for Home Healthcare) MEDICAL NECESSITY: I certify, that based on my findings, NURSING services are a medically necessary home health service. HOME BOUND STATUS: I certify that my clinical findings support that this patient is homebound (i.e., Due to illness or injury, pt requires aid of supportive devices such as crutches, cane, wheelchairs, walkers, the use of special transportation or the assistance of another person to leave their place of residence. There is a normal inability to leave the home and doing so requires considerable and taxing effort. Other absences  are for medical reasons / religious services and are infrequent or of short duration when for other reasons). Please direct any NON-WOUND related issues/requests for orders to patient's Primary Care Physician I have recommended we go back to Silver alginate and a 4-layer Profore compression wrap. I have reiterated to her that she has to find a lymphedema pumps and use them twice a day for an hour each, and she has found her palms and is going to begin using them this week. She will also bring her juxta light compression stockings with her when she comes to see as next week. Electronic Signature(s) Signed: 11/28/2015 3:45:20 PM By: Evlyn Kanner MD, FACS Previous Signature: 11/28/2015 1:13:27 PM Version By: Evlyn Kanner MD, FACS Previous Signature: 11/28/2015 1:12:34 PM Version By: Evlyn Kanner MD, FACS Entered By:  Evlyn Kanner on 11/28/2015 15:45:20 Herbers, [name] ([id number]) -------------------------------------------------------------------------------- SuperBill Details Patient Name: Mitch, Ethelene L. Date of Service: 11/28/2015 Medical Record Number: [id number] Patient Account Number: [id number] Date of Birth/Sex: 1941/03/10 (75 y.o. 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

## 2015-11-30 NOTE — Progress Notes (Signed)
[name], [name] ([id number]) Visit Report for 11/28/2015 Arrival Information Details Patient Name: [name], [name] Date of Service: 11/28/2015 12:45 PM Medical Record Number: [id number] Patient Account Number: [id number] Date of Birth/Sex: [birth year] (75 y.o. Female) Treating RN: Phillis HaggisPinkerton, Debi Primary Care Physician: Elizabeth SauerJones, Deanna Other Clinician: Referring Physician: Elizabeth SauerJones, Deanna Treating Physician/Extender: Rudene ReBritto, Errol Weeks in Treatment: 9 Visit Information History Since Last Visit All ordered tests and consults were completed: No Patient Arrived: Dan HumphreysWalker Added or deleted any medications: No Arrival Time: 12:27 Any new allergies or adverse reactions: No Accompanied By: self Had a fall or experienced change in No Transfer Assistance: EasyPivot activities of daily living that may affect Patient Lift risk of falls: Patient Identification Verified: Yes Signs or symptoms of abuse/neglect since last No Secondary Verification Process Yes visito Completed: Hospitalized since last visit: No Patient Requires Transmission- No Pain Present Now: No Based Precautions: Patient Has Alerts: Yes Electronic Signature(s) Signed: 11/29/2015 4:50:53 PM By: Alejandro MullingPinkerton, Debra Entered By: Alejandro MullingPinkerton, Debra on 11/28/2015 12:28:35 Nicholls, [name] ([id number]) -------------------------------------------------------------------------------- Encounter Discharge Information Details Patient Name: [name] L. Date of Service: 11/28/2015 12:45 PM Medical Record Number: [id number] Patient Account Number: [id number] Date of Birth/Sex: [birth year] (75 y.o. Female) Treating RN: Phillis HaggisPinkerton, Debi Primary Care Physician: Elizabeth SauerJones, Deanna Other Clinician: Referring Physician: Elizabeth SauerJones, Deanna Treating Physician/Extender: Rudene ReBritto, Errol Weeks in Treatment: 9 Encounter Discharge Information Items Discharge Pain Level: 0 Discharge Condition: Stable Ambulatory Status: [name] Discharge Destination:  Home Transportation: Private Auto Accompanied By: self Schedule Follow-up Appointment: Yes Medication Reconciliation completed Yes and provided to Patient/Care Elexus Barman: Provided on Clinical Summary of Care: 11/28/2015 Form Type Recipient Paper Patient AS Electronic Signature(s) Signed: 11/28/2015 1:28:58 PM By: Gwenlyn PerkingMoore, Shelia Entered By: Gwenlyn PerkingMoore, Shelia on 11/28/2015 13:28:58 [name], [name] ([id number]) -------------------------------------------------------------------------------- Lower Extremity Assessment Details Patient Name: [name], [name] Date of Service: 11/28/2015 12:45 PM Medical Record Number: [id number] Patient Account Number: [id number] Date of Birth/Sex: [birth year] (75 y.o. Female) Treating RN: Ashok CordiaPinkerton, Debi Primary Care Physician: Elizabeth SauerJones, Deanna Other Clinician: Referring Physician: Elizabeth SauerJones, Deanna Treating Physician/Extender: Rudene ReBritto, Errol Weeks in Treatment: 9 Edema Assessment Assessed: Kyra Searles[Left: No] [Right: No] E[Left: dema] [Right: :] Calf Left: Right: Point of Measurement: cm From Medial Instep 44.6 cm 38 cm Ankle Left: Right: Point of Measurement: cm From Medial Instep 32 cm 33.4 cm Vascular Assessment Pulses: Posterior Tibial Dorsalis Pedis Palpable: [Left:Yes] [Right:Yes] Extremity colors, hair growth, and conditions: Extremity Color: [Left:Red] [Right:Red] Temperature of Extremity: [Left:Warm] [Right:Warm] Capillary Refill: [Left:< 3 seconds] [Right:< 3 seconds] Toe Nail Assessment Left: Right: Thick: No No Discolored: No No Deformed: No No Improper Length and Hygiene: No No Electronic Signature(s) Signed: 11/29/2015 4:50:53 PM By: Alejandro MullingPinkerton, Debra Entered By: Alejandro MullingPinkerton, Debra on 11/28/2015 12:31:43 [name], [name] ([id number]) -------------------------------------------------------------------------------- Multi Wound Chart Details Patient Name: [name], [name] Date of Service: 11/28/2015 12:45 PM Medical Record Number:  [id number] Patient Account Number: [id number] Date of Birth/Sex: [birth year] (75 y.o. Female) Treating RN: Phillis HaggisPinkerton, Debi Primary Care Physician: Elizabeth SauerJones, Deanna Other Clinician: Referring Physician: Elizabeth SauerJones, Deanna Treating Physician/Extender: Rudene ReBritto, Errol Weeks in Treatment: 9 Vital Signs Height(in): 67 Pulse(bpm): 89 Weight(lbs): 189 Blood Pressure 142/88 (mmHg): Body Mass Index(BMI): 30 Temperature(F): 97.8 Respiratory Rate 18 (breaths/min): Photos: [1:No Photos] [2:No Photos] [N/A:N/A] Wound Location: [1:Left Lower Leg - Circumfernential] [2:Right Lower Leg - Circumfernential] [N/A:N/A] Wounding Event: [1:Gradually Appeared] [2:Gradually Appeared] [N/A:N/A] Primary Etiology: [1:Lymphedema] [2:Lymphedema] [N/A:N/A] Comorbid History: [1:Cataracts, Deep Vein Thrombosis, Hypertension, Peripheral Venous Disease] [2:Cataracts, Deep Vein Thrombosis, Hypertension, Peripheral Venous Disease] [N/A:N/A] Date Acquired: [1:09/07/2014] [2:09/07/2014] [N/A:N/A] Weeks of  Treatment: [1:9] [2:9] [N/A:N/A] Wound Status: [1:Open] [2:Open] [N/A:N/A] Measurements L x W x D 0.5x0.5x0.1 [2:0.5x0.5x0.5] [N/A:N/A] (cm) Area (cm) : [1:0.196] [2:0.196] [N/A:N/A] Volume (cm) : [1:0.02] [2:0.098] [N/A:N/A] % Reduction in Area: [1:100.00%] [2:99.90%] [N/A:N/A] % Reduction in Volume: 100.00% [2:99.60%] [N/A:N/A] Classification: [1:Partial Thickness] [2:Partial Thickness] [N/A:N/A] Exudate Amount: [1:Small] [2:Large] [N/A:N/A] Exudate Type: [1:Serous] [2:Serous] [N/A:N/A] Exudate Color: [1:amber] [2:amber] [N/A:N/A] Wound Margin: [1:Flat and Intact] [2:Flat and Intact] [N/A:N/A] Granulation Amount: [1:Large (67-100%)] [2:Small (1-33%)] [N/A:N/A] Granulation Quality: [1:Pink] [2:Red] [N/A:N/A] Necrotic Amount: [1:None Present (0%)] [2:Large (67-100%)] [N/A:N/A] Necrotic Tissue: [1:N/A] [2:Eschar, Adherent Slough] [N/A:N/A] Epithelialization: [1:None] [2:None] [N/A:N/A] Periwound Skin Texture: No  Abnormalities Noted [2:Edema: Yes] [N/A:N/A] Periwound Skin Dry/Scaly: Yes Moist: Yes N/A Moisture: Periwound Skin Color: No Abnormalities Noted No Abnormalities Noted N/A Temperature: No Abnormality No Abnormality N/A Tenderness on No Yes N/A Palpation: Wound Preparation: Ulcer Cleansing: Other: Ulcer Cleansing: Other: N/A SOAP AND WATER soap and water Topical Anesthetic Topical Anesthetic Applied: None Applied: None Treatment Notes Electronic Signature(s) Signed: 11/29/2015 4:50:53 PM By: Alejandro Mulling Entered By: Alejandro Mulling on 11/28/2015 12:47:08 Silverman, [name] ([id number]) -------------------------------------------------------------------------------- Multi-Disciplinary Care Plan Details Patient Name: Marie Burner L. Date of Service: 11/28/2015 12:45 PM Medical Record Number: [id number] Patient Account Number: [id number] Date of Birth/Sex: Aug 05, 1940 (75 y.o. Female) Treating RN: Phillis Haggis Primary Care Physician: Elizabeth Sauer Other Clinician: Referring Physician: Elizabeth Sauer Treating Physician/Extender: Rudene Re in Treatment: 9 Active Inactive Orientation to the Wound Care Program Nursing Diagnoses: Knowledge deficit related to the wound healing center program Goals: Patient/caregiver will verbalize understanding of the Wound Healing Center Program Date Initiated: 09/26/2015 Goal Status: Active Interventions: Provide education on orientation to the wound center Notes: Venous Leg Ulcer Nursing Diagnoses: Actual venous Insuffiency (use after diagnosis is confirmed) Goals: Patient will maintain optimal edema control Date Initiated: 09/26/2015 Goal Status: Active Patient/caregiver will verbalize understanding of disease process and disease management Date Initiated: 09/26/2015 Goal Status: Active Interventions: Assess peripheral edema status every visit. Compression as ordered Provide education on venous  insufficiency Notes: Wound/Skin Impairment Lax, [name] ([id number]) Nursing Diagnoses: Impaired tissue integrity Goals: Ulcer/skin breakdown will have a volume reduction of 30% by week 4 Date Initiated: 09/26/2015 Goal Status: Active Ulcer/skin breakdown will have a volume reduction of 50% by week 8 Date Initiated: 09/26/2015 Goal Status: Active Ulcer/skin breakdown will have a volume reduction of 80% by week 12 Date Initiated: 09/26/2015 Goal Status: Active Ulcer/skin breakdown will heal within 14 weeks Date Initiated: 09/26/2015 Goal Status: Active Interventions: Assess patient/caregiver ability to obtain necessary supplies Assess patient/caregiver ability to perform ulcer/skin care regimen upon admission and as needed Assess ulceration(s) every visit Provide education on ulcer and skin care Notes: Electronic Signature(s) Signed: 11/29/2015 4:50:53 PM By: Alejandro Mulling Entered By: Alejandro Mulling on 11/28/2015 12:45:48 Jezewski, [name] ([id number]) -------------------------------------------------------------------------------- Pain Assessment Details Patient Name: Donley, [name] Date of Service: 11/28/2015 12:45 PM Medical Record Number: [id number] Patient Account Number: [id number] Date of Birth/Sex: [birth year] (75 y.o. Female) Treating RN: Phillis Haggis Primary Care Physician: Elizabeth Sauer Other Clinician: Referring Physician: Elizabeth Sauer Treating Physician/Extender: Rudene Re in Treatment: 9 Active Problems Location of Pain Severity and Description of Pain Patient Has Paino No Site Locations Pain Management and Medication Current Pain Management: Electronic Signature(s) Signed: 11/29/2015 4:50:53 PM By: Alejandro Mulling Entered By: Alejandro Mulling on 11/28/2015 12:28:42 [name], [name] ([id number]) -------------------------------------------------------------------------------- Patient/Caregiver Education Details Patient Name: Marie Burner L. Date of Service: 11/28/2015 12:45 PM Medical Record Number: [id number] Patient Account  Number: [id number] Date of Birth/Gender: [birth year] (75 y.o. Female) Treating RN: Phillis Haggis Primary Care Physician: Elizabeth Sauer Other Clinician: Referring Physician: Elizabeth Sauer Treating Physician/Extender: Rudene Re in Treatment: 9 Education Assessment Education Provided To: Patient Education Topics Provided Wound/Skin Impairment: Handouts: Other: do not get wraps wet Methods: Demonstration, Explain/Verbal Responses: State content correctly Electronic Signature(s) Signed: 11/29/2015 4:50:53 PM By: Alejandro Mulling Entered By: Alejandro Mulling on 11/28/2015 13:23:34 [name], [name] ([id number]) -------------------------------------------------------------------------------- Wound Assessment Details Patient Name: [name], [name] Date of Service: 11/28/2015 12:45 PM Medical Record Number: [id number] Patient Account Number: [id number] Date of Birth/Sex: 08-Mar-1941 (75 y.o. Female) Treating RN: Ashok Cordia, Debi Primary Care Physician: Elizabeth Sauer Other Clinician: Referring Physician: Elizabeth Sauer Treating Physician/Extender: Rudene Re in Treatment: 9 Wound Status Wound Number: 1 Primary Lymphedema Etiology: Wound Location: Left Lower Leg - Circumfernential Wound Open Status: Wounding Event: Gradually Appeared Comorbid Cataracts, Deep Vein Thrombosis, Date Acquired: 09/07/2014 History: Hypertension, Peripheral Venous Weeks Of Treatment: 9 Disease Clustered Wound: No Photos Photo Uploaded By: Alejandro Mulling on 11/28/2015 16:09:08 Wound Measurements Length: (cm) 0.5 Width: (cm) 0.5 Depth: (cm) 0.1 Area: (cm) 0.196 Volume: (cm) 0.02 % Reduction in Area: 100% % Reduction in Volume: 100% Epithelialization: None Tunneling: No Undermining: No Wound Description Classification: Partial Thickness Wound Margin: Flat and Intact Exudate  Amount: Small Exudate Type: Serous Exudate Color: amber Foul Odor After Cleansing: No Wound Bed Granulation Amount: Large (67-100%) Granulation Quality: Pink Necrotic Amount: None Present (0%) Zuccaro, Naija L. ([id number]) Periwound Skin Texture Texture Color No Abnormalities Noted: No No Abnormalities Noted: No Moisture Temperature / Pain No Abnormalities Noted: No Temperature: No Abnormality Dry / Scaly: Yes Wound Preparation Ulcer Cleansing: Other: SOAP AND WATER, Topical Anesthetic Applied: None Treatment Notes Wound #1 (Left, Circumferential Lower Leg) 1. Cleansed with: Cleanse wound with antibacterial soap and water 3. Peri-wound Care: Moisturizing lotion 4. Dressing Applied: Aquacel Ag 5. Secondary Dressing Applied ABD Pad 7. Secured with Tape 4 Layer Compression System - Bilateral Electronic Signature(s) Signed: 11/29/2015 4:50:53 PM By: Alejandro Mulling Entered By: Alejandro Mulling on 11/28/2015 12:45:01 [name], [name] ([id number]) -------------------------------------------------------------------------------- Wound Assessment Details Patient Name: [name], [name] Date of Service: 11/28/2015 12:45 PM Medical Record Number: [id number] Patient Account Number: [id number] Date of Birth/Sex: 1940-07-11 (75 y.o. Female) Treating RN: Ashok Cordia, Debi Primary Care Physician: Elizabeth Sauer Other Clinician: Referring Physician: Elizabeth Sauer Treating Physician/Extender: Rudene Re in Treatment: 9 Wound Status Wound Number: 2 Primary Lymphedema Etiology: Wound Location: Right Lower Leg - Circumfernential Wound Open Status: Wounding Event: Gradually Appeared Comorbid Cataracts, Deep Vein Thrombosis, Date Acquired: 09/07/2014 History: Hypertension, Peripheral Venous Weeks Of Treatment: 9 Disease Clustered Wound: No Photos Photo Uploaded By: Alejandro Mulling on 11/28/2015 16:09:22 Wound Measurements Length: (cm) 0.5 Width: (cm) 0.5 Depth: (cm)  0.5 Area: (cm) 0.196 Volume: (cm) 0.098 % Reduction in Area: 99.9% % Reduction in Volume: 99.6% Epithelialization: None Tunneling: No Wound Description Classification: Partial Thickness Wound Margin: Flat and Intact Exudate Amount: Large Exudate Type: Serous Exudate Color: amber Foul Odor After Cleansing: No Wound Bed Granulation Amount: Small (1-33%) Exposed Structure Granulation Quality: Red Fascia Exposed: No Necrotic Amount: Large (67-100%) Fat Layer Exposed: No Dwyer, [name] ([id number]) Necrotic Quality: Eschar, Adherent Slough Tendon Exposed: No Muscle Exposed: No Joint Exposed: No Bone Exposed: No Limited to Skin Breakdown Periwound Skin Texture Texture Color No Abnormalities Noted: No No Abnormalities Noted: No Localized Edema: Yes Temperature / Pain Moisture Temperature: No Abnormality No Abnormalities Noted: No Tenderness on Palpation: Yes Moist:  Yes Wound Preparation Ulcer Cleansing: Other: soap and water, Topical Anesthetic Applied: None Treatment Notes Wound #2 (Right, Circumferential Lower Leg) 1. Cleansed with: Cleanse wound with antibacterial soap and water 3. Peri-wound Care: Moisturizing lotion 4. Dressing Applied: Aquacel Ag 5. Secondary Dressing Applied ABD Pad 7. Secured with Tape 4 Layer Compression System - Bilateral Electronic Signature(s) Signed: 11/29/2015 4:50:53 PM By: Alejandro Mulling Entered By: Alejandro Mulling on 11/28/2015 12:45:42 [name], [name] ([id number]) -------------------------------------------------------------------------------- Vitals Details Patient Name: Marie Burner L. Date of Service: 11/28/2015 12:45 PM Medical Record Number: [id number] Patient Account Number: [id number] Date of Birth/Sex: [birth year] (75 y.o. Female) Treating RN: Ashok Cordia, Debi Primary Care Physician: Elizabeth Sauer Other Clinician: Referring Physician: Elizabeth Sauer Treating Physician/Extender: Rudene Re in  Treatment: 9 Vital Signs Time Taken: 12:28 Temperature (F): 97.8 Height (in): 67 Pulse (bpm): 89 Weight (lbs): 189 Respiratory Rate (breaths/min): 18 Body Mass Index (BMI): 29.6 Blood Pressure (mmHg): 142/88 Reference Range: 80 - 120 mg / dl Electronic Signature(s) Signed: 11/29/2015 4:50:53 PM By: Alejandro Mulling Entered By: Alejandro Mulling on 11/28/2015 12:30:20

## 2015-12-01 DIAGNOSIS — I872 Venous insufficiency (chronic) (peripheral): Secondary | ICD-10-CM | POA: Diagnosis not present

## 2015-12-01 DIAGNOSIS — I5022 Chronic systolic (congestive) heart failure: Secondary | ICD-10-CM | POA: Diagnosis not present

## 2015-12-01 DIAGNOSIS — I482 Chronic atrial fibrillation: Secondary | ICD-10-CM | POA: Diagnosis not present

## 2015-12-01 DIAGNOSIS — L97811 Non-pressure chronic ulcer of other part of right lower leg limited to breakdown of skin: Secondary | ICD-10-CM | POA: Diagnosis not present

## 2015-12-01 DIAGNOSIS — I272 Other secondary pulmonary hypertension: Secondary | ICD-10-CM | POA: Diagnosis not present

## 2015-12-01 DIAGNOSIS — Z87891 Personal history of nicotine dependence: Secondary | ICD-10-CM | POA: Diagnosis not present

## 2015-12-01 DIAGNOSIS — D509 Iron deficiency anemia, unspecified: Secondary | ICD-10-CM | POA: Diagnosis not present

## 2015-12-01 DIAGNOSIS — M159 Polyosteoarthritis, unspecified: Secondary | ICD-10-CM | POA: Diagnosis not present

## 2015-12-01 DIAGNOSIS — I110 Hypertensive heart disease with heart failure: Secondary | ICD-10-CM | POA: Diagnosis not present

## 2015-12-01 DIAGNOSIS — I340 Nonrheumatic mitral (valve) insufficiency: Secondary | ICD-10-CM | POA: Diagnosis not present

## 2015-12-01 DIAGNOSIS — Z9181 History of falling: Secondary | ICD-10-CM | POA: Diagnosis not present

## 2015-12-06 DIAGNOSIS — I5022 Chronic systolic (congestive) heart failure: Secondary | ICD-10-CM | POA: Diagnosis not present

## 2015-12-06 DIAGNOSIS — I272 Other secondary pulmonary hypertension: Secondary | ICD-10-CM | POA: Diagnosis not present

## 2015-12-06 DIAGNOSIS — Z87891 Personal history of nicotine dependence: Secondary | ICD-10-CM | POA: Diagnosis not present

## 2015-12-06 DIAGNOSIS — D509 Iron deficiency anemia, unspecified: Secondary | ICD-10-CM | POA: Diagnosis not present

## 2015-12-06 DIAGNOSIS — I872 Venous insufficiency (chronic) (peripheral): Secondary | ICD-10-CM | POA: Diagnosis not present

## 2015-12-06 DIAGNOSIS — I110 Hypertensive heart disease with heart failure: Secondary | ICD-10-CM | POA: Diagnosis not present

## 2015-12-06 DIAGNOSIS — M159 Polyosteoarthritis, unspecified: Secondary | ICD-10-CM | POA: Diagnosis not present

## 2015-12-06 DIAGNOSIS — L97811 Non-pressure chronic ulcer of other part of right lower leg limited to breakdown of skin: Secondary | ICD-10-CM | POA: Diagnosis not present

## 2015-12-06 DIAGNOSIS — I482 Chronic atrial fibrillation: Secondary | ICD-10-CM | POA: Diagnosis not present

## 2015-12-06 DIAGNOSIS — I340 Nonrheumatic mitral (valve) insufficiency: Secondary | ICD-10-CM | POA: Diagnosis not present

## 2015-12-06 DIAGNOSIS — Z9181 History of falling: Secondary | ICD-10-CM | POA: Diagnosis not present

## 2015-12-08 ENCOUNTER — Encounter: Payer: Medicare Other | Attending: Surgery | Admitting: Surgery

## 2015-12-08 DIAGNOSIS — I2510 Atherosclerotic heart disease of native coronary artery without angina pectoris: Secondary | ICD-10-CM | POA: Diagnosis not present

## 2015-12-08 DIAGNOSIS — I87031 Postthrombotic syndrome with ulcer and inflammation of right lower extremity: Secondary | ICD-10-CM | POA: Diagnosis not present

## 2015-12-08 DIAGNOSIS — Z87891 Personal history of nicotine dependence: Secondary | ICD-10-CM | POA: Diagnosis not present

## 2015-12-08 DIAGNOSIS — L97222 Non-pressure chronic ulcer of left calf with fat layer exposed: Secondary | ICD-10-CM | POA: Diagnosis not present

## 2015-12-08 DIAGNOSIS — I4891 Unspecified atrial fibrillation: Secondary | ICD-10-CM | POA: Insufficient documentation

## 2015-12-08 DIAGNOSIS — I110 Hypertensive heart disease with heart failure: Secondary | ICD-10-CM | POA: Insufficient documentation

## 2015-12-08 DIAGNOSIS — Z86718 Personal history of other venous thrombosis and embolism: Secondary | ICD-10-CM | POA: Diagnosis not present

## 2015-12-08 DIAGNOSIS — E663 Overweight: Secondary | ICD-10-CM | POA: Insufficient documentation

## 2015-12-08 DIAGNOSIS — I872 Venous insufficiency (chronic) (peripheral): Secondary | ICD-10-CM | POA: Insufficient documentation

## 2015-12-08 DIAGNOSIS — I5022 Chronic systolic (congestive) heart failure: Secondary | ICD-10-CM | POA: Diagnosis not present

## 2015-12-08 DIAGNOSIS — I890 Lymphedema, not elsewhere classified: Secondary | ICD-10-CM | POA: Diagnosis not present

## 2015-12-08 DIAGNOSIS — I87032 Postthrombotic syndrome with ulcer and inflammation of left lower extremity: Secondary | ICD-10-CM | POA: Insufficient documentation

## 2015-12-08 DIAGNOSIS — L97212 Non-pressure chronic ulcer of right calf with fat layer exposed: Secondary | ICD-10-CM | POA: Diagnosis not present

## 2015-12-08 DIAGNOSIS — Z09 Encounter for follow-up examination after completed treatment for conditions other than malignant neoplasm: Secondary | ICD-10-CM | POA: Diagnosis not present

## 2015-12-08 DIAGNOSIS — Z872 Personal history of diseases of the skin and subcutaneous tissue: Secondary | ICD-10-CM | POA: Diagnosis not present

## 2015-12-08 NOTE — Progress Notes (Addendum)
[name], [name] ([id number]) Visit Report for 12/08/2015 Chief Complaint Document Details Patient Name: [name]. Date of Service: 12/08/2015 12:45 PM Medical Record Patient Account Number: [id number] Number: Afful, RN, BSN, Treating RN: Oct 17, 1940 (75 y.o. [name] Date of Birth/Sex: Female) Other Clinician: Primary Care Physician: Elizabeth Sauer Treating Evlyn Kanner Referring Physician: Elizabeth Sauer Physician/Extender: Tania Ade in Treatment: 10 Information Obtained from: Patient Chief Complaint Patient presents for treatment of an open ulcer due to venous insufficiency which has been continuing this way for several months Electronic Signature(s) Signed: 12/08/2015 12:59:56 PM By: Evlyn Kanner MD, FACS Entered By: Evlyn Kanner on 12/08/2015 12:59:55 [name], [name] ([id number]) -------------------------------------------------------------------------------- HPI Details Patient Name: [name]. Date of Service: 12/08/2015 12:45 PM Medical Record Patient Account Number: [id number] Number: Afful, RN, BSN, Treating RN: [birth year] (75 y.o. [name] Date of Birth/Sex: Female) Other Clinician: Primary Care Physician: Elizabeth Sauer Treating Evlyn Kanner Referring Physician: Elizabeth Sauer Physician/Extender: Tania Ade in Treatment: 10 History of Present Illness Location: bilateral lower extremity wounds Quality: Patient reports experiencing heaviness to affected area(s). Severity: Patient states wound are getting worse. Duration: Patient has had the wound for > 8 months prior to seeking treatment at the wound center Timing: Pain in wound is Intermittent (comes and goes Context: The wound would happen gradually Modifying Factors: Other treatment(s) tried include:Unna's boots and treatment by Dr. Gilda Crease at the vascular surgery office Associated Signs and Symptoms: Patient reports having increase discharge. HPI Description: 75 year old patient who is a poor  historian but most of the history is being obtained from Dr. Levora Dredge her vascular surgeon who has been treating her for about 8 months to a year. the patient has a postphlebitic syndrome both lower extremities and has been under his care for a while. The ABIs done in 2013 were normal and he does not suspect any vascular arterial compromise as the resting ABI on the right was 1.03 on the left was 1.02. An ultrasound of the lower extremity bilaterally did not reveal any DVT or superficial thrombophlebitis bilaterally. Patient's initial problem started when she had a hematoma for right lower extremity due to being on Coumadin and then developed a lot of swelling and has been having Unna's boots for a long while. However recently her left lower extremity started swelling and oozing profusely and the patient did not want to have Unna's boots applied and hence Dr. Gilda Crease has referred her to [place] for possible failure of 4 layer wraps with Profore. past medical history does not include diabetes mellitus but she has a cardiac issue and has had some element of CHF. She has been treated with lymphedema pumps in the past. 10/03/2015 -- the patient's lower extremities have been weeping very significantly and she has had a lot of swelling and redness since the last few days. She was seen by Dr. Gilda Crease this morning and he had recommended admission to the hospital but the patient was unwilling to have that done and came here for a second opinion. 10/10/2015 -- was admitted to the hospital March 27 to 10/07/2015 for bilateral cellulitis of lower extremities and also had chronic A. fib, chronic systolic heart failure, venous insufficiency of the legs and coronary artery disease. She was given 5 days of vancomycin and ampicillin and sulbactam. She was seen by Dr. Wyn Quaker and while she was inpatient and he recommended continuing with compression and oral antibiotics ( Augmentin ) at least for 2  weeks. 10/31/2015 -- her home health nurses have not yet ordered  her juxta lites stockings and we will re-order them and talk to them if they have any questions 11/07/2015 -- the home health nurses have not got her juxta lites stockings but they have got her the 20-30 mm compression stockings the patient says is going to be difficult for her to [facility]. [name], [name] ([id number]) 11/21/2015 -- she has not been very compliant and not wearing her compression stockings nor has she been using her juxta lites or using her lymphedema pumps. She has a new place opened up again on her right lower extremity. Electronic Signature(s) Signed: 12/08/2015 1:00:03 PM By: Evlyn Kanner MD, FACS Entered By: Evlyn Kanner on 12/08/2015 13:00:02 [name], [name] ([id number]) -------------------------------------------------------------------------------- Physical Exam Details Patient Name: [name], [name]. Date of Service: 12/08/2015 12:45 PM Medical Record Patient Account Number: [id number] Number: Afful, RN, BSN, Treating RN: 08-18-1940 (75 y.o. [name] Date of Birth/Sex: Female) Other Clinician: Primary Care Physician: Elizabeth Sauer Treating Evlyn Kanner Referring Physician: Elizabeth Sauer Physician/Extender: Tania Ade in Treatment: 10 Constitutional . Pulse regular. Respirations normal and unlabored. Afebrile. . Eyes Nonicteric. Reactive to light. Ears, Nose, Mouth, and Throat Lips, teeth, and gums WNL.Marland Kitchen Moist mucosa without lesions. Neck supple and nontender. No palpable supraclavicular or cervical adenopathy. Normal sized without goiter. Respiratory WNL. No retractions.. Cardiovascular Pedal Pulses WNL. No clubbing, cyanosis or edema. Lymphatic No adneopathy. No adenopathy. No adenopathy. Musculoskeletal Adexa without tenderness or enlargement.. Digits and nails w/o clubbing, cyanosis, infection, petechiae, ischemia, or inflammatory conditions.. Integumentary (Hair, Skin) No  suspicious lesions. No crepitus or fluctuance. No peri-wound warmth or erythema. No masses.Marland Kitchen Psychiatric Judgement and insight Intact.. No evidence of depression, anxiety, or agitation.. Notes her lymphedema is much better today and her ulceration is all completely healed. Electronic Signature(s) Signed: 12/08/2015 1:15:23 PM By: Evlyn Kanner MD, FACS Entered By: Evlyn Kanner on 12/08/2015 13:15:23 [name], [name] ([id number]) -------------------------------------------------------------------------------- Physician Orders Details Patient Name: [name]. Date of Service: 12/08/2015 12:45 PM Medical Record Patient Account Number: [id number] Number: Afful, RN, BSN, Treating RN: 07-16-1940 (74 y.o. 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 o Compression Pump: Use compression pump on right lower extremity for 30 minutes, twice daily. Discharge From [facility] Services o Discharge from Wound Care Center - Treatment Completed Electronic Signature(s) Signed: 12/08/2015 4:11:18 PM By: Evlyn Kanner MD, FACS Signed: 12/08/2015 5:34:58 PM  By: Elpidio Eric BSN, RN Entered By: Elpidio Eric on 12/08/2015 13:10:31 [place], [name] ([id number]) -------------------------------------------------------------------------------- Problem List Details Patient Name: [name], [name]. Date of Service: 12/08/2015 12:45 PM Medical Record Patient Account Number: [id number] Number: Afful, RN, BSN, Treating RN: Nov 26, 1940 (74 y.o. [place] [name] Date of Birth/Sex: Female) Other Clinician: Primary Care Physician: Elizabeth Sauer Treating Evlyn Kanner Referring Physician: Elizabeth Sauer Physician/Extender: Tania Ade in Treatment: 10 Active Problems  ICD-10 Encounter Code Description Active Date Diagnosis I87.031 Postthrombotic syndrome with ulcer and inflammation of 09/26/2015 Yes right lower extremity I87.032 Postthrombotic syndrome with ulcer and inflammation of 09/26/2015 Yes left lower extremity L97.222 Non-pressure chronic ulcer of left calf with fat layer 09/26/2015 Yes exposed L97.212 Non-pressure chronic ulcer of right calf with fat layer 09/26/2015 Yes exposed E66.3 Overweight 09/26/2015 Yes Inactive Problems Resolved Problems ICD-10 Code Description Active Date Resolved Date L03.116 Cellulitis of left lower limb 10/03/2015 10/03/2015 L03.115 Cellulitis of right lower limb 10/03/2015 10/03/2015 [name], [name]. ([id number]) Electronic Signature(s) Signed: 12/08/2015 12:59:49 PM By: Evlyn KannerBritto, Yanis Juma MD, FACS Entered By: Evlyn KannerBritto, Lateia Fraser on 12/08/2015 12:59:48 Sitar, Danaisha Elbert EwingsL. ([id number]) -------------------------------------------------------------------------------- Progress Note Details Patient Name: Poupard, Marsi [name]. Date of Service: 12/08/2015 12:45 PM Medical Record Patient Account Number: [id number] Number: Afful, RN, BSN, Treating RN: Aug 05, 1940 (74 y.o. [place] Sinkita Date of Birth/Sex: Female) Other Clinician: Primary Care Physician: Elizabeth SauerJones, Deanna Treating Evlyn KannerBritto, Sativa Gelles Referring Physician: Elizabeth SauerJones,  Deanna Physician/Extender: Tania AdeWeeks in Treatment: 10 Subjective Chief Complaint Information obtained from Patient Patient presents for treatment of an open ulcer due to venous insufficiency which has been continuing this way for several months History of Present Illness (HPI) The following HPI elements were documented for the patient's wound: Location: bilateral lower extremity wounds Quality: Patient reports experiencing heaviness to affected area(s). Severity: Patient states wound are getting worse. Duration: Patient has had the wound for > 8 months prior to seeking treatment at the wound center Timing: Pain in wound is Intermittent (comes and goes Context: The wound would happen gradually Modifying Factors: Other treatment(s) tried include:Unna's boots and treatment by Dr. Gilda CreaseSchnier at the vascular surgery office Associated Signs and Symptoms: Patient reports having increase discharge. 75 year old patient who is a poor historian but most of the history is being obtained from Dr. Levora DredgeGregory Schnier her vascular surgeon who has been treating her for about 8 months to a year. the patient has a postphlebitic syndrome both lower extremities and has been under his care for a while. The ABIs done in 2013 were normal and he does not suspect any vascular arterial compromise as the resting ABI on the right was 1.03 on the left was 1.02. An ultrasound of the lower extremity bilaterally did not reveal any DVT or superficial thrombophlebitis bilaterally. Patient's initial problem started when she had a hematoma for right lower extremity due to being on Coumadin and then developed a lot of swelling and has been having Unna's boots for a long while. However recently her left lower extremity started swelling and oozing profusely and the patient did not want to have Unna's boots applied and hence Dr. Gilda CreaseSchnier has referred her to us for possible failure of 4 layer wraps with Profore. past medical history does  not include diabetes mellitus but she has a cardiac issue and has had some element of CHF. She has been treated with lymphedema pumps in the past. 10/03/2015 -- the patient's lower extremities have been weeping very significantly and she has had a lot of swelling and redness since the last few days. She was seen by Dr. Gilda CreaseSchnier this morning and he had recommended admission to the hospital but the patient was unwilling to have that done and came here for a second opinion. [name], [name]. ([id number]) 10/10/2015 -- was admitted to the hospital March 27 to 10/07/2015 for bilateral cellulitis of lower extremities and also had chronic A. fib, chronic systolic heart failure, venous insufficiency of the legs and coronary artery disease. She was given  5 days of vancomycin and ampicillin and sulbactam. She was seen by Dr. Wyn Quaker and while she was inpatient and he recommended continuing with compression and oral antibiotics ( Augmentin ) at least for 2 weeks. 10/31/2015 -- her home health nurses have not yet ordered her juxta lites stockings and we will re-order them and talk to them if they have any questions 11/07/2015 -- the home health nurses have not got her juxta lites stockings but they have got her the 20-30 mm compression stockings the patient says is going to be difficult for her to [facility]. 11/21/2015 -- she has not been very compliant and not wearing her compression stockings nor has she been using her juxta lites or using her lymphedema pumps. She has a new place opened up again on her right lower extremity. Objective Constitutional Pulse regular. Respirations normal and unlabored. Afebrile. Vitals Time Taken: 12:55 PM, Height: 67 in, Weight: 189 lbs, BMI: 29.6, Pulse: 77 bpm, Respiratory Rate: 18 breaths/min, Blood Pressure: 115/66 mmHg. Eyes Nonicteric. Reactive to light. Ears, Nose, Mouth, and Throat Lips, teeth, and gums WNL.Marland Kitchen Moist mucosa without lesions. Neck supple and nontender. No  palpable supraclavicular or cervical adenopathy. Normal sized without goiter. Respiratory WNL. No retractions.. Cardiovascular Pedal Pulses WNL. No clubbing, cyanosis or edema. Lymphatic No adneopathy. No adenopathy. No adenopathy. Musculoskeletal Adexa without tenderness or enlargement.. Digits and nails w/o clubbing, cyanosis, infection, petechiae, ischemia, or inflammatory conditions.Marland Kitchen Psychiatric [name], [name] ([id number]) Judgement and insight Intact.. No evidence of depression, anxiety, or agitation.. General Notes: her lymphedema is much better today and her ulceration is all completely healed. Integumentary (Hair, Skin) No suspicious lesions. No crepitus or fluctuance. No peri-wound warmth or erythema. No masses.. Wound #1 status is Healed - Epithelialized. Original cause of wound was Gradually Appeared. The wound is located on the Left,Circumferential Lower Leg. The wound measures 0cm length x 0cm width x 0cm depth; 0cm^2 area and 0cm^3 volume. The wound is limited to skin breakdown. There is no tunneling or undermining noted. There is a none present amount of drainage noted. The wound margin is flat and intact. There is no granulation within the wound bed. There is no necrotic tissue within the wound bed. The periwound skin appearance exhibited: Localized Edema, Dry/Scaly. The periwound skin appearance did not exhibit: Callus, Crepitus, Excoriation, Fluctuance, Friable, Induration, Rash, Scarring, Maceration, Moist, Atrophie Blanche, Cyanosis, Ecchymosis, Hemosiderin Staining, Mottled, Pallor, Rubor, Erythema. Periwound temperature was noted as No Abnormality. Wound #2 status is Healed - Epithelialized. Original cause of wound was Gradually Appeared. The wound is located on the Right,Circumferential Lower Leg. The wound measures 0cm length x 0cm width x 0cm depth; 0cm^2 area and 0cm^3 volume. The wound is limited to skin breakdown. There is no tunneling or undermining noted.  There is a none present amount of drainage noted. The wound margin is flat and intact. There is no granulation within the wound bed. There is no necrotic tissue within the wound bed. The periwound skin appearance exhibited: Localized Edema, Dry/Scaly. The periwound skin appearance did not exhibit: Callus, Crepitus, Excoriation, Fluctuance, Friable, Induration, Rash, Scarring, Maceration, Moist, Atrophie Blanche, Cyanosis, Ecchymosis, Hemosiderin Staining, Mottled, Pallor, Rubor, Erythema. Periwound temperature was noted as No Abnormality. Assessment Active Problems ICD-10 I87.031 - Postthrombotic syndrome with ulcer and inflammation of right lower extremity I87.032 - Postthrombotic syndrome with ulcer and inflammation of left lower extremity L97.222 - Non-pressure chronic ulcer of left calf with fat layer exposed L97.212 - Non-pressure chronic ulcer of right calf with fat layer  exposed E66.3 - Overweight Plan Edema Control: [name], [name]. ([id number]) Patient to wear own compression stockings Elevate legs to the level of the heart and pump ankles as often as possible Compression Pump: Use compression pump on left lower extremity for 30 minutes, twice daily. Compression Pump: Use compression pump on right lower extremity for 30 minutes, twice daily. Discharge From [facility] Services: Discharge from Wound Care Center - Treatment Completed I have recommended we continue use her compression stockings all day and to use her lymph pumps twice a day for an hour each. I spent a great deal of time discussing with her the need to elevate and exercise and also use a lymph pumps regularly. her wounds are completely healed and we have discharged her from the wound care services and will see her back as needed. Electronic Signature(s) Signed: 12/08/2015 1:16:37 PM By: Evlyn Kanner MD, FACS Entered By: Evlyn Kanner on 12/08/2015 13:16:37 Ponciano, [name]  ([id number]) -------------------------------------------------------------------------------- SuperBill Details Patient Name: [name]. Date of Service: 12/08/2015 Medical Record Patient Account Number: [id number] Number: Afful, RN, BSN, Treating RN: 1941-01-25 (75 y.o. 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 E66.3 Overweight Facility Procedures CPT4 Code: [id number] Description: 57322 - WOUND CARE VISIT-LEV 2 EST PT Modifier: Quantity: 1 Physician Procedures CPT4: Description Modifier Quantity Code [id number] - WC PHYS LEVEL 3 - EST PT 1 ICD-10 Description Diagnosis I87.031 Postthrombotic syndrome with ulcer and inflammation of right lower extremity L97.222 Non-pressure chronic ulcer of left calf with fat  layer exposed I87.032 Postthrombotic syndrome with ulcer and inflammation of left lower extremity L97.212 Non-pressure chronic ulcer of right calf with fat layer exposed Electronic Signature(s) Signed: 12/08/2015 1:16:51 PM By: Evlyn Kanner MD, FACS Entered By: Evlyn Kanner on 12/08/2015 13:16:51

## 2015-12-09 NOTE — Progress Notes (Signed)
LAURAANN, MISSEY ([id number]) Visit Report for 12/08/2015 Arrival Information Details Patient Name: FREDDA, [name]. Date of Service: 12/08/2015 12:45 PM Medical Record Patient Account Number: [id number] Number: Afful, RN, BSN, Treating RN: 03-Nov-1940 (75 y.o. [place] S[place] Date of Birth/Sex: Female) Other Clinician: Primary Care Physician: Elizabeth Sauer Treating Britto, Errol Referring Physician: Elizabeth Sauer Physician/Extender: Tania Ade in Treatment: 10 Visit Information History Since Last Visit Added or deleted any medications: No Patient Arrived: [place] Any new allergies or adverse reactions: No Arrival Time: 12:51 Had a fall or experienced change in No Accompanied By: self activities of daily living that may affect Transfer Assistance: None risk of falls: Patient Identification Verified: Yes Signs or symptoms of abuse/neglect since last No Secondary Verification Process Yes visito Completed: Hospitalized since last visit: No Patient Requires Transmission-Based No Has Dressing in Place as Prescribed: Yes Precautions: Has Compression in Place as Prescribed: Yes Patient Has Alerts: Yes Pain Present Now: No Electronic Signature(s) Signed: 12/08/2015 5:34:58 PM By: Elpidio Eric BSN, RN Entered By: Elpidio Eric on 12/08/2015 12:51:23 Glore, Levora Angel ([id number]) -------------------------------------------------------------------------------- Clinic Level of Care Assessment Details Patient Name: [name] Date of Service: 12/08/2015 12:45 PM Medical Record Patient Account Number: [id number] Number: Afful, RN, BSN, Treating RN: [birth year] (75 y.o. [place] Sink Date of Birth/Sex: Female) Other Clinician: Primary Care Physician: Elizabeth Sauer Treating Britto, Errol Referring Physician: Elizabeth Sauer Physician/Extender: Tania Ade in Treatment: 10 Clinic Level of Care Assessment Items TOOL 4 Quantity Score []  - Use when only an EandM is performed on FOLLOW-UP visit  0 ASSESSMENTS - Nursing Assessment / Reassessment X - Reassessment of Co-morbidities (includes updates in patient status) 1 10 X - Reassessment of Adherence to Treatment Plan 1 5 ASSESSMENTS - Wound and Skin Assessment / Reassessment []  - Simple Wound Assessment / Reassessment - one wound 0 []  - Complex Wound Assessment / Reassessment - multiple wounds 0 []  - Dermatologic / Skin Assessment (not related to wound area) 0 ASSESSMENTS - Focused Assessment []  - Circumferential Edema Measurements - multi extremities 0 []  - Nutritional Assessment / Counseling / Intervention 0 X - Lower Extremity Assessment (monofilament, tuning fork, pulses) 1 5 []  - Peripheral Arterial Disease Assessment (using hand held doppler) 0 ASSESSMENTS - Ostomy and/or Continence Assessment and Care []  - Incontinence Assessment and Management 0 []  - Ostomy Care Assessment and Management (repouching, etc.) 0 PROCESS - Coordination of Care X - Simple Patient / Family Education for ongoing care 1 15 []  - Complex (extensive) Patient / Family Education for ongoing care 0 []  - Staff obtains Chiropractor, Records, Test Results / Process Orders 0 []  - Staff telephones HHA, Nursing Homes / Clarify orders / etc 0 Ayuso, Gretna L. ([id number]) []  - Routine Transfer to another Facility (non-emergent condition) 0 []  - Routine Hospital Admission (non-emergent condition) 0 []  - New Admissions / Manufacturing engineer / Ordering NPWT, Apligraf, etc. 0 []  - Emergency Hospital Admission (emergent condition) 0 []  - Simple Discharge Coordination 0 []  - Complex (extensive) Discharge Coordination 0 PROCESS - Special Needs []  - Pediatric / Minor Patient Management 0 []  - Isolation Patient Management 0 []  - Hearing / Language / Visual special needs 0 []  - Assessment of Community assistance (transportation, D/C planning, etc.) 0 []  - Additional assistance / Altered mentation 0 []  - Support Surface(s) Assessment (bed, cushion, seat, etc.)  0 INTERVENTIONS - Wound Cleansing / Measurement X - Simple Wound Cleansing - one wound 1 5 []  - Complex Wound Cleansing - multiple wounds 0 X - Wound  Imaging (photographs - any number of wounds) 1 5  - Wound Tracing (instead of photographs) 0  - Simple Wound Measurement - one wound 0  - Complex Wound Measurement - multiple wounds 0 INTERVENTIONS - Wound Dressings  - Small Wound Dressing one or multiple wounds 0  - Medium Wound Dressing one or multiple wounds 0  - Large Wound Dressing one or multiple wounds 0  - Application of Medications - topical 0  - Application of Medications - injection 0 Wishart, Vasti L. ([id number]) INTERVENTIONS - Miscellaneous  - External ear exam 0  - Specimen Collection (cultures, biopsies, blood, body fluids, etc.) 0  - Specimen(s) / Culture(s) sent or taken to Lab for analysis 0  - Patient Transfer (multiple staff / Michiel Sites Lift / Similar devices) 0  - Simple Staple / Suture removal (25 or less) 0  - Complex Staple / Suture removal (26 or more) 0  - Hypo / Hyperglycemic Management (close monitor of Blood Glucose) 0  - Ankle / Brachial Index (ABI) - do not check if billed separately 0 X - Vital Signs 1 5 Has the patient been seen at the hospital within the last three years: Yes Total Score: 50 Level Of Care: New/Established - Level 2 Electronic Signature(s) Signed: 12/08/2015 5:34:58 PM By: Elpidio Eric BSN, RN Entered By: Elpidio Eric on 12/08/2015 13:11:28 Lagerquist, Levora Angel ([id number]) -------------------------------------------------------------------------------- Encounter Discharge Information Details Patient Name: [name] Date of Service: 12/08/2015 12:45 PM Medical Record Patient Account Number: [id number] Number: Afful, RN, BSN, Treating RN: September 28, 1940 (75 y.o. [place] Sink Date of Birth/Sex: Female) Other Clinician: Primary Care Physician: Elizabeth Sauer Treating Evlyn Kanner Referring Physician:  Elizabeth Sauer Physician/Extender: Tania Ade in Treatment: 10 Encounter Discharge Information Items Schedule Follow-up Appointment: No Medication Reconciliation completed No and provided to Patient/Care Nerine Pulse: Patient Clinical Summary of Care: Declined Electronic Signature(s) Signed: 12/08/2015 1:20:26 PM By: Gwenlyn Perking Entered By: Gwenlyn Perking on 12/08/2015 13:20:26 Staszewski, Levora Angel ([id number]) -------------------------------------------------------------------------------- Lower Extremity Assessment Details Patient Name: Azpeitia, Alexya L. Date of Service: 12/08/2015 12:45 PM Medical Record Patient Account Number: [id number] Number: Afful, RN, BSN, Treating RN: 10-Sep-1940 (74 y.o. [place] Sink Date of Birth/Sex: Female) Other Clinician: Primary Care Physician: Elizabeth Sauer Treating Britto, Errol Referring Physician: Elizabeth Sauer Physician/Extender: Tania Ade in Treatment: 10 Edema Assessment Assessed: [Left: No] [Right: No] Edema: [Left: Yes] [Right: Yes] Calf Left: Right: Point of Measurement: 34 cm From Medial Instep 44 cm 37.8 cm Ankle Left: Right: Point of Measurement: 11 cm From Medial Instep 32 cm 33 cm Vascular Assessment Claudication: Claudication Assessment [Left:None] [Right:None] Pulses: Posterior Tibial Dorsalis Pedis Palpable: [Left:Yes] [Right:Yes] Extremity colors, hair growth, and conditions: Extremity Color: [Left:Mottled] [Right:Mottled] Hair Growth on Extremity: [Left:No] [Right:No] Temperature of Extremity: [Left:Warm] [Right:Warm] Capillary Refill: [Left:< 3 seconds] [Right:< 3 seconds] Electronic Signature(s) Signed: 12/08/2015 5:34:58 PM By: Elpidio Eric BSN, RN Entered By: Elpidio Eric on 12/08/2015 12:50:57 Oran, Elaine Elbert Ewings ([id number]) -------------------------------------------------------------------------------- Pain Assessment Details Patient Name: Oesterling, Yusra L. Date of Service: 12/08/2015 12:45 PM Medical Record Patient Account  Number: [id number] Number: Afful, RN, BSN, Treating RN: 1941/03/01 (74 y.o. [place] Sink Date of Birth/Sex: Female) Other Clinician: Primary Care Physician: Elizabeth Sauer Treating Evlyn Kanner Referring Physician: Elizabeth Sauer Physician/Extender: Tania Ade in Treatment: 10 Active Problems Location of Pain Severity and Description of Pain Patient Has Paino No Site Locations With Dressing Change: No Pain Management and Medication Current Pain Management: Electronic Signature(s) Signed: 12/08/2015 5:34:58 PM By: Elpidio Eric BSN, RN Entered By: Elpidio Eric on 12/08/2015  12:53:32 JAMYRAH, SAUR ([id number]) -------------------------------------------------------------------------------- Wound Assessment Details Patient Name: Lininger, Rolinda L. Date of Service: 12/08/2015 12:45 PM Medical Record Patient Account Number: [id number] Number: Afful, RN, BSN, Treating RN: 1940/12/19 (74 y.o. [place] Sink Date of Birth/Sex: Female) Other Clinician: Primary Care Physician: Elizabeth Sauer Treating Britto, Errol Referring Physician: Elizabeth Sauer Physician/Extender: Tania Ade in Treatment: 10 Wound Status Wound Number: 1 Primary Lymphedema Etiology: Wound Location: Left Lower Leg - Circumfernential Wound Healed - Epithelialized Status: Wounding Event: Gradually Appeared Comorbid Cataracts, Deep Vein Thrombosis, Date Acquired: 09/07/2014 History: Hypertension, Peripheral Venous Weeks Of Treatment: 10 Disease Clustered Wound: No Photos Photo Uploaded By: Elpidio Eric on 12/08/2015 14:59:09 Wound Measurements Length: (cm) 0 % Reduction in Width: (cm) 0 % Reduction in Depth: (cm) 0 Epithelializati Area: (cm) 0 Tunneling: Volume: (cm) 0 Undermining: Area: 100% Volume: 100% on: Large (67-100%) No No Wound Description Classification: Partial Thickness Foul Odor Afte Wound Margin: Flat and Intact Exudate Amount: None Present r Cleansing: No Wound Bed Granulation Amount: None  Present (0%) Exposed Structure Necrotic Amount: None Present (0%) Fascia Exposed: No Fat Layer Exposed: No Tendon Exposed: No Roundy, Marlyce L. ([id number]) Muscle Exposed: No Joint Exposed: No Bone Exposed: No Limited to Skin Breakdown Periwound Skin Texture Texture Color No Abnormalities Noted: No No Abnormalities Noted: No Callus: No Atrophie Blanche: No Crepitus: No Cyanosis: No Excoriation: No Ecchymosis: No Fluctuance: No Erythema: No Friable: No Hemosiderin Staining: No Induration: No Mottled: No Localized Edema: Yes Pallor: No Rash: No Rubor: No Scarring: No Temperature / Pain Moisture Temperature: No Abnormality No Abnormalities Noted: No Dry / Scaly: Yes Maceration: No Moist: No Wound Preparation Ulcer Cleansing: Other: SOAP AND WATER, Topical Anesthetic Applied: None Electronic Signature(s) Signed: 12/08/2015 5:34:58 PM By: Elpidio Eric BSN, RN Entered By: Elpidio Eric on 12/08/2015 13:01:53 Zelek, Levora Angel ([id number]) -------------------------------------------------------------------------------- Wound Assessment Details Patient Name: Bencivenga, Sayuri L. Date of Service: 12/08/2015 12:45 PM Medical Record Patient Account Number: [id number] Number: Afful, RN, BSN, Treating RN: August 30, 1940 (74 y.o.  Sink Date of Birth/Sex: Female) Other Clinician: Primary Care Physician: Elizabeth Sauer Treating Britto, Errol Referring Physician: Elizabeth Sauer Physician/Extender: Tania Ade in Treatment: 10 Wound Status Wound Number: 2 Primary Lymphedema Etiology: Wound Location: Right Lower Leg - Circumfernential Wound Healed - Epithelialized Status: Wounding Event: Gradually Appeared Comorbid Cataracts, Deep Vein Thrombosis, Date Acquired: 09/07/2014 History: Hypertension, Peripheral Venous Weeks Of Treatment: 10 Disease Clustered Wound: No Photos Photo Uploaded By: Elpidio Eric on 12/08/2015 14:59:40 Wound Measurements Length: (cm) 0 % Reduction  in Width: (cm) 0 % Reduction in Depth: (cm) 0 Epithelializati Area: (cm) 0 Tunneling: Volume: (cm) 0 Undermining: Area: 100% Volume: 100% on: Large (67-100%) No No Wound Description Classification: Partial Thickness Foul Odor Afte Wound Margin: Flat and Intact Exudate Amount: None Present r Cleansing: No Wound Bed Granulation Amount: None Present (0%) Exposed Structure Necrotic Amount: None Present (0%) Fascia Exposed: No Fat Layer Exposed: No Tendon Exposed: No Jenison, Cipriana L. ([id number]) Muscle Exposed: No Joint Exposed: No Bone Exposed: No Limited to Skin Breakdown Periwound Skin Texture Texture Color No Abnormalities Noted: No No Abnormalities Noted: No Callus: No Atrophie Blanche: No Crepitus: No Cyanosis: No Excoriation: No Ecchymosis: No Fluctuance: No Erythema: No Friable: No Hemosiderin Staining: No Induration: No Mottled: No Localized Edema: Yes Pallor: No Rash: No Rubor: No Scarring: No Temperature / Pain Moisture Temperature: No Abnormality No Abnormalities Noted: No Dry / Scaly: Yes Maceration: No Moist: No Wound Preparation Ulcer Cleansing: Other: soap and water, Topical Anesthetic Applied: None Electronic  Signature(s) Signed: 12/08/2015 5:34:58 PM By: Elpidio EricAfful, Rita BSN, RN Entered By: Elpidio EricAfful, Rita on 12/08/2015 13:02:15 Henandez, Levora AngelANNIE L. ([id number]) -------------------------------------------------------------------------------- Vitals Details Patient Name: [name], Rashmi L. Date of Service: 12/08/2015 12:45 PM Medical Record Patient Account Number: [id number] Number: Afful, RN, BSN, Treating RN: 12/13/1940 (74 y.o. [place] Date of Birth/Sex: Female) Other Clinician: Primary Care Physician: Elizabeth SauerJones, Deanna Treating Britto, Errol Referring Physician: Elizabeth SauerJones, Deanna Physician/Extender: Tania AdeWeeks in Treatment: 10 Vital Signs Time Taken: 12:55 Pulse (bpm): 77 Height (in): 67 Respiratory Rate (breaths/min): 18 Weight (lbs):  189 Blood Pressure (mmHg): 115/66 Body Mass Index (BMI): 29.6 Reference Range: 80 - 120 mg / dl Electronic Signature(s) Signed: 12/08/2015 5:34:58 PM By: Elpidio EricAfful, Rita BSN, RN Entered By: Elpidio EricAfful, Rita on 12/08/2015 12:55:53

## 2015-12-15 ENCOUNTER — Other Ambulatory Visit: Payer: Self-pay

## 2015-12-15 DIAGNOSIS — D509 Iron deficiency anemia, unspecified: Secondary | ICD-10-CM | POA: Diagnosis not present

## 2015-12-15 DIAGNOSIS — I272 Other secondary pulmonary hypertension: Secondary | ICD-10-CM | POA: Diagnosis not present

## 2015-12-15 DIAGNOSIS — M159 Polyosteoarthritis, unspecified: Secondary | ICD-10-CM | POA: Diagnosis not present

## 2015-12-15 DIAGNOSIS — I5022 Chronic systolic (congestive) heart failure: Secondary | ICD-10-CM | POA: Diagnosis not present

## 2015-12-15 DIAGNOSIS — Z87891 Personal history of nicotine dependence: Secondary | ICD-10-CM | POA: Diagnosis not present

## 2015-12-15 DIAGNOSIS — Z9181 History of falling: Secondary | ICD-10-CM | POA: Diagnosis not present

## 2015-12-15 DIAGNOSIS — I110 Hypertensive heart disease with heart failure: Secondary | ICD-10-CM | POA: Diagnosis not present

## 2015-12-15 DIAGNOSIS — L97811 Non-pressure chronic ulcer of other part of right lower leg limited to breakdown of skin: Secondary | ICD-10-CM | POA: Diagnosis not present

## 2015-12-15 DIAGNOSIS — I872 Venous insufficiency (chronic) (peripheral): Secondary | ICD-10-CM | POA: Diagnosis not present

## 2015-12-15 DIAGNOSIS — I340 Nonrheumatic mitral (valve) insufficiency: Secondary | ICD-10-CM | POA: Diagnosis not present

## 2015-12-15 DIAGNOSIS — I482 Chronic atrial fibrillation: Secondary | ICD-10-CM | POA: Diagnosis not present

## 2015-12-15 MED ORDER — GABAPENTIN 300 MG PO CAPS: 300.0000 mg | ORAL_CAPSULE | Freq: Two times a day (BID) | ORAL | Status: AC

## 2015-12-21 DIAGNOSIS — M159 Polyosteoarthritis, unspecified: Secondary | ICD-10-CM | POA: Diagnosis not present

## 2015-12-21 DIAGNOSIS — L97811 Non-pressure chronic ulcer of other part of right lower leg limited to breakdown of skin: Secondary | ICD-10-CM | POA: Diagnosis not present

## 2015-12-21 DIAGNOSIS — Z9181 History of falling: Secondary | ICD-10-CM | POA: Diagnosis not present

## 2015-12-21 DIAGNOSIS — I110 Hypertensive heart disease with heart failure: Secondary | ICD-10-CM | POA: Diagnosis not present

## 2015-12-21 DIAGNOSIS — D509 Iron deficiency anemia, unspecified: Secondary | ICD-10-CM | POA: Diagnosis not present

## 2015-12-21 DIAGNOSIS — I272 Other secondary pulmonary hypertension: Secondary | ICD-10-CM | POA: Diagnosis not present

## 2015-12-21 DIAGNOSIS — I872 Venous insufficiency (chronic) (peripheral): Secondary | ICD-10-CM | POA: Diagnosis not present

## 2015-12-21 DIAGNOSIS — I340 Nonrheumatic mitral (valve) insufficiency: Secondary | ICD-10-CM | POA: Diagnosis not present

## 2015-12-21 DIAGNOSIS — Z87891 Personal history of nicotine dependence: Secondary | ICD-10-CM | POA: Diagnosis not present

## 2015-12-21 DIAGNOSIS — I482 Chronic atrial fibrillation: Secondary | ICD-10-CM | POA: Diagnosis not present

## 2015-12-21 DIAGNOSIS — I5022 Chronic systolic (congestive) heart failure: Secondary | ICD-10-CM | POA: Diagnosis not present

## 2015-12-26 DIAGNOSIS — I110 Hypertensive heart disease with heart failure: Secondary | ICD-10-CM | POA: Diagnosis not present

## 2015-12-26 DIAGNOSIS — Z9181 History of falling: Secondary | ICD-10-CM | POA: Diagnosis not present

## 2015-12-26 DIAGNOSIS — I340 Nonrheumatic mitral (valve) insufficiency: Secondary | ICD-10-CM | POA: Diagnosis not present

## 2015-12-26 DIAGNOSIS — Z87891 Personal history of nicotine dependence: Secondary | ICD-10-CM | POA: Diagnosis not present

## 2015-12-26 DIAGNOSIS — L97811 Non-pressure chronic ulcer of other part of right lower leg limited to breakdown of skin: Secondary | ICD-10-CM | POA: Diagnosis not present

## 2015-12-26 DIAGNOSIS — I5022 Chronic systolic (congestive) heart failure: Secondary | ICD-10-CM | POA: Diagnosis not present

## 2015-12-26 DIAGNOSIS — I272 Other secondary pulmonary hypertension: Secondary | ICD-10-CM | POA: Diagnosis not present

## 2015-12-26 DIAGNOSIS — I872 Venous insufficiency (chronic) (peripheral): Secondary | ICD-10-CM | POA: Diagnosis not present

## 2015-12-26 DIAGNOSIS — I482 Chronic atrial fibrillation: Secondary | ICD-10-CM | POA: Diagnosis not present

## 2015-12-26 DIAGNOSIS — D509 Iron deficiency anemia, unspecified: Secondary | ICD-10-CM | POA: Diagnosis not present

## 2015-12-26 DIAGNOSIS — M159 Polyosteoarthritis, unspecified: Secondary | ICD-10-CM | POA: Diagnosis not present

## 2016-01-05 DIAGNOSIS — Z87891 Personal history of nicotine dependence: Secondary | ICD-10-CM | POA: Diagnosis not present

## 2016-01-05 DIAGNOSIS — M159 Polyosteoarthritis, unspecified: Secondary | ICD-10-CM | POA: Diagnosis not present

## 2016-01-05 DIAGNOSIS — D509 Iron deficiency anemia, unspecified: Secondary | ICD-10-CM | POA: Diagnosis not present

## 2016-01-05 DIAGNOSIS — I872 Venous insufficiency (chronic) (peripheral): Secondary | ICD-10-CM | POA: Diagnosis not present

## 2016-01-05 DIAGNOSIS — I340 Nonrheumatic mitral (valve) insufficiency: Secondary | ICD-10-CM | POA: Diagnosis not present

## 2016-01-05 DIAGNOSIS — Z9181 History of falling: Secondary | ICD-10-CM | POA: Diagnosis not present

## 2016-01-05 DIAGNOSIS — I482 Chronic atrial fibrillation: Secondary | ICD-10-CM | POA: Diagnosis not present

## 2016-01-05 DIAGNOSIS — I272 Other secondary pulmonary hypertension: Secondary | ICD-10-CM | POA: Diagnosis not present

## 2016-01-05 DIAGNOSIS — L97811 Non-pressure chronic ulcer of other part of right lower leg limited to breakdown of skin: Secondary | ICD-10-CM | POA: Diagnosis not present

## 2016-01-05 DIAGNOSIS — I110 Hypertensive heart disease with heart failure: Secondary | ICD-10-CM | POA: Diagnosis not present

## 2016-01-05 DIAGNOSIS — I5022 Chronic systolic (congestive) heart failure: Secondary | ICD-10-CM | POA: Diagnosis not present

## 2016-01-23 DIAGNOSIS — M79609 Pain in unspecified limb: Secondary | ICD-10-CM | POA: Diagnosis not present

## 2016-01-23 DIAGNOSIS — L97209 Non-pressure chronic ulcer of unspecified calf with unspecified severity: Secondary | ICD-10-CM | POA: Diagnosis not present

## 2016-02-06 DIAGNOSIS — I890 Lymphedema, not elsewhere classified: Secondary | ICD-10-CM | POA: Diagnosis not present

## 2016-02-20 DIAGNOSIS — Z961 Presence of intraocular lens: Secondary | ICD-10-CM | POA: Diagnosis not present

## 2016-03-05 ENCOUNTER — Ambulatory Visit (INDEPENDENT_AMBULATORY_CARE_PROVIDER_SITE_OTHER): Payer: Medicare Other | Admitting: Family Medicine

## 2016-03-05 ENCOUNTER — Encounter: Payer: Self-pay | Admitting: Family Medicine

## 2016-03-05 VITALS — BP 130/82 | HR 72 | Temp 98.2°F | Ht 67.0 in | Wt 200.0 lb

## 2016-03-05 DIAGNOSIS — N342 Other urethritis: Secondary | ICD-10-CM

## 2016-03-05 DIAGNOSIS — N760 Acute vaginitis: Secondary | ICD-10-CM

## 2016-03-05 DIAGNOSIS — K5732 Diverticulitis of large intestine without perforation or abscess without bleeding: Secondary | ICD-10-CM

## 2016-03-05 LAB — POCT URINALYSIS DIPSTICK
BILIRUBIN UA: NEGATIVE
GLUCOSE UA: NEGATIVE
KETONES UA: NEGATIVE
Nitrite, UA: NEGATIVE
RBC UA: NEGATIVE
SPEC GRAV UA: 1.01
UROBILINOGEN UA: 0.2
pH, UA: 7

## 2016-03-05 MED ORDER — AMOXICILLIN 500 MG PO CAPS: 500.0000 mg | ORAL_CAPSULE | Freq: Three times a day (TID) | ORAL | 0 refills | Status: AC

## 2016-03-05 MED ORDER — METRONIDAZOLE 500 MG PO TABS: 500.0000 mg | ORAL_TABLET | Freq: Three times a day (TID) | ORAL | 0 refills | Status: AC

## 2016-03-05 MED ORDER — NYSTATIN-TRIAMCINOLONE 100000-0.1 UNIT/GM-% EX OINT: 1.0000 "application " | TOPICAL_OINTMENT | Freq: Two times a day (BID) | CUTANEOUS | 0 refills | Status: AC

## 2016-03-05 MED ORDER — FLUCONAZOLE 150 MG PO TABS: 150.0000 mg | ORAL_TABLET | Freq: Once | ORAL | 0 refills | Status: AC

## 2016-03-05 NOTE — Progress Notes (Signed)
Name: [name]   MRN: [id number]    DOB: April 13, 1941   Date:03/05/2016       Progress Note  Subjective  Chief Complaint  Chief Complaint  Patient presents with  . Urinary Tract Infection    feels the urge to go and can't pee alot, hurts to sit down    Urinary Tract Infection   This is a chronic problem. The current episode started in the past 7 days. The problem occurs every urination. The problem has been waxing and waning. The quality of the pain is described as burning. The pain is at a severity of 4/10. The pain is moderate. There has been no fever. Associated symptoms include frequency and hesitancy. Pertinent negatives include no chills, discharge, flank pain, hematuria, nausea, sweats, urgency or vomiting. Associated symptoms comments: oliguria. She has tried nothing for the symptoms. The treatment provided mild relief.    No problem-specific Assessment & Plan notes found for this encounter.   Past Medical History:  Diagnosis Date  . A-fib (HCC) 12/18/2014  . Atrial fibrillation (HCC)   . Benign essential HTN 10/25/2014  . Bilateral lower extremity edema 12/18/2014  . Carpal tunnel syndrome 12/30/2014  . Cellulitis of leg, right 12/18/2014  . Chronic systolic heart failure (HCC) 05/18/2014   Overview:  Global ef 35%   . Chronic venous insufficiency 12/29/2014  . Degenerative arthritis of hip 06/08/2014  . History of repair of hip joint 09/19/2014  . HLD (hyperlipidemia)   . HTN (hypertension) 12/18/2014  . Hypertension   . Lower extremity edema   . 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 Pulmonary HTN (HCC)   . Valvular heart disease     Past Surgical History:  Procedure Laterality Date  . ABDOMINAL HYSTERECTOMY    . BEDSIDE EVACUATION OF HEMATOMA Right 03/16/2015   Procedure:  EVACUATION OF HEMATOMA;  Surgeon: Renford Dills, MD;  Location: [facility] ORS;  Service: Vascular;  Laterality: Right;  . bilateral wrist surgery      . cataract surgery Bilateral   . I&D EXTREMITY Right 04/06/2015   Procedure: IRRIGATION AND DEBRIDEMENT EXTREMITY;  Surgeon: Renford Dills, MD;  Location: [facility] ORS;  Service: Vascular;  Laterality: Right;  Wound matrix skin graft and wound vac placement  . JOINT REPLACEMENT Left 06/2014   hip  . left hip replacement    . MINOR APPLICATION OF WOUND VAC Right 03/16/2015   Procedure: MINOR APPLICATION OF WOUND VAC;  Surgeon: Renford Dills, MD;  Location: [facility] ORS;  Service: Vascular;  Laterality: Right;    Family History  Problem Relation Age of Onset  . Heart attack Father   . Stroke Mother     Social History   Social History  . Marital status: Unknown    Spouse name: N/A  . Number of children: N/A  . Years of education: N/A   Occupational History  . Not on file.   Social History Main Topics  . Smoking status: Former Smoker    Quit date: 04/01/1984  . Smokeless tobacco: Never Used  . Alcohol use No  . Drug use: No  . Sexual activity: No   Other Topics Concern  . Not on file   Social History Narrative  . No narrative on file    No Known Allergies   Review of Systems  Constitutional: Negative for chills, fever, malaise/fatigue and weight loss.  HENT: Negative for ear discharge, ear pain and sore throat.   Eyes:  Negative for blurred vision.  Respiratory: Negative for cough, sputum production, shortness of breath and wheezing.   Cardiovascular: Negative for chest pain, palpitations and leg swelling.  Gastrointestinal: Negative for abdominal pain, blood in stool, constipation, diarrhea, heartburn, melena, nausea and vomiting.  Genitourinary: Positive for frequency and hesitancy. Negative for dysuria, flank pain, hematuria and urgency.  Musculoskeletal: Negative for back pain, joint pain, myalgias and neck pain.  Skin: Negative for rash.  Neurological: Negative for dizziness, tingling, sensory change, focal weakness and headaches.  Endo/Heme/Allergies: Negative  for environmental allergies and polydipsia. Does not bruise/bleed easily.  Psychiatric/Behavioral: Negative for depression and suicidal ideas. The patient is not nervous/anxious and does not have insomnia.      Objective  Vitals:   03/05/16 1341  BP: 130/82  Pulse: 72  Temp: 98.2 F (36.8 C)  TempSrc: Oral  Weight: 200 lb (90.7 kg)  Height: 5\' 7"  (1.702 m)    Physical Exam  Constitutional: She is well-developed, well-nourished, and in no distress. No distress.  HENT:  Head: Normocephalic and atraumatic.  Right Ear: External ear normal.  Left Ear: External ear normal.  Nose: Nose normal.  Mouth/Throat: Oropharynx is clear and moist.  Eyes: Conjunctivae and EOM are normal. Pupils are equal, round, and reactive to light. Right eye exhibits no discharge. Left eye exhibits no discharge.  Neck: Normal range of motion. Neck supple. No JVD present. No thyromegaly present.  Cardiovascular: Normal rate, regular rhythm, normal heart sounds and intact distal pulses.  Exam reveals no gallop and no friction rub.   No murmur heard. Pulmonary/Chest: Effort normal and breath sounds normal. She has no wheezes. She has no rales.  Abdominal: Soft. Bowel sounds are normal. She exhibits no mass. There is no hepatosplenomegaly. There is tenderness in the left lower quadrant. There is no guarding and no CVA tenderness.    Musculoskeletal: Normal range of motion. She exhibits no edema.  Lymphadenopathy:    She has no cervical adenopathy.  Neurological: She is alert. She has normal reflexes.  Skin: Skin is warm and dry. She is not diaphoretic.  Psychiatric: Mood and affect normal.  Nursing note and vitals reviewed.     Assessment & Plan  Problem List Items Addressed This Visit    None    Visit Diagnoses    Vulvovaginitis    -  Primary   Relevant Medications   fluconazole (DIFLUCAN) 150 MG tablet   nystatin-triamcinolone ointment (MYCOLOG)   Urethritis       Relevant Orders   POCT  urinalysis dipstick (Completed)   Diverticulitis of large intestine without perforation or abscess without bleeding       Relevant Medications   metroNIDAZOLE (FLAGYL) 500 MG tablet   amoxicillin (AMOXIL) 500 MG capsule        Dr. Hayden Rasmusseneanna Greg Eckrich [facility]  03/05/16

## 2016-03-26 DIAGNOSIS — I5022 Chronic systolic (congestive) heart failure: Secondary | ICD-10-CM | POA: Diagnosis not present

## 2016-03-26 DIAGNOSIS — I872 Venous insufficiency (chronic) (peripheral): Secondary | ICD-10-CM | POA: Diagnosis not present

## 2016-03-26 DIAGNOSIS — I10 Essential (primary) hypertension: Secondary | ICD-10-CM | POA: Diagnosis not present

## 2016-03-26 DIAGNOSIS — I340 Nonrheumatic mitral (valve) insufficiency: Secondary | ICD-10-CM | POA: Diagnosis not present

## 2016-03-26 DIAGNOSIS — I482 Chronic atrial fibrillation: Secondary | ICD-10-CM | POA: Diagnosis not present

## 2016-04-23 ENCOUNTER — Encounter (INDEPENDENT_AMBULATORY_CARE_PROVIDER_SITE_OTHER): Payer: Self-pay | Admitting: Vascular Surgery

## 2016-04-23 ENCOUNTER — Ambulatory Visit (INDEPENDENT_AMBULATORY_CARE_PROVIDER_SITE_OTHER): Payer: Medicare Other | Admitting: Vascular Surgery

## 2016-04-23 VITALS — BP 144/84 | HR 94 | Resp 16 | Ht 67.0 in | Wt 209.0 lb

## 2016-04-23 DIAGNOSIS — R600 Localized edema: Secondary | ICD-10-CM

## 2016-04-23 DIAGNOSIS — L03115 Cellulitis of right lower limb: Secondary | ICD-10-CM

## 2016-04-23 DIAGNOSIS — I890 Lymphedema, not elsewhere classified: Secondary | ICD-10-CM

## 2016-04-23 DIAGNOSIS — I872 Venous insufficiency (chronic) (peripheral): Secondary | ICD-10-CM

## 2016-04-23 MED ORDER — CEPHALEXIN 500 MG PO CAPS: 500.0000 mg | ORAL_CAPSULE | Freq: Three times a day (TID) | ORAL | 1 refills | Status: AC

## 2016-04-23 NOTE — Progress Notes (Signed)
MRN : [id number]  [name] is a 75 y.o. ([birth year]) female who presents with chief complaint of  Chief Complaint  Patient presents with  . Follow-up  .  History of Present Illness: Patient is seen for evaluation of leg swelling. The patient first noticed the swelling remotely but is now concerned because of a significant increase in the overall edema. She noted the increase in pain is associated with increased redness and warmth  The patient has not been wearing graduated compression.  The patient denies a history of DVT or PE. There is no prior history of phlebitis. 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   Current Outpatient Prescriptions  Medication Sig Dispense Refill  . acetaminophen (TYLENOL) 325 MG tablet Take 650 mg by mouth every 6 (six) hours as needed.    Marland Kitchen. aspirin EC 81 MG tablet Take 81 mg by mouth daily.    . carvedilol (COREG) 25 MG tablet Take 25 mg by mouth 2 (two) times daily with a meal.     . clotrimazole-betamethasone (LOTRISONE) cream Apply 1 application topically 2 (two) times daily. 30 g 0  . ferrous sulfate 325 (65 FE) MG EC tablet Take 325 mg by mouth 3 (three) times daily with meals.    . furosemide (LASIX) 40 MG tablet Take 40 mg by mouth every morning.    . gabapentin (NEURONTIN) 300 MG capsule Take 1 capsule (300 mg total) by mouth 2 (two) times daily. Reported on 08/22/2015 60 capsule 1  . nystatin-triamcinolone ointment (MYCOLOG) Apply 1 application topically 2 (two) times daily. 30 g 0  . vitamin B-12 (CYANOCOBALAMIN) 100 MCG tablet Take 100 mcg by mouth daily.    Marland Kitchen. amoxicillin (AMOXIL) 500 MG capsule Take 1 capsule (500 mg total) by mouth 3 (three) times daily. (Patient not taking: Reported on 04/23/2016) 21 capsule 0  . cephALEXin (KEFLEX) 500 MG capsule Take 1 capsule (500 mg total) by mouth 3 (three) times daily. 30 capsule 1  . docusate sodium (COLACE) 100 MG capsule Take 2 capsules (200 mg total) by mouth 2 (two) times daily. (Patient  not taking: Reported on 04/23/2016) 10 capsule 0  . feeding supplement (BOOST / RESOURCE BREEZE) LIQD Take 1 Container by mouth daily.    . metroNIDAZOLE (FLAGYL) 500 MG tablet Take 1 tablet (500 mg total) by mouth 3 (three) times daily. (Patient not taking: Reported on 04/23/2016) 21 tablet 0   No current facility-administered medications for this visit.     Past Medical History:  Diagnosis Date  . A-fib (HCC) 12/18/2014  . Atrial fibrillation (HCC)   . Benign essential HTN 10/25/2014  . Bilateral lower extremity edema 12/18/2014  . Carpal tunnel syndrome 12/30/2014  . Cellulitis of leg, right 12/18/2014  . Chronic systolic heart failure (HCC) 05/18/2014   Overview:  Global ef 35%   . Chronic venous insufficiency 12/29/2014  . Degenerative arthritis of hip 06/08/2014  . History of repair of hip joint 09/19/2014  . HLD (hyperlipidemia)   . HTN (hypertension) 12/18/2014  . Hypertension   . Lower extremity edema   . 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 Pulmonary HTN   . Valvular heart disease     Past Surgical History:  Procedure Laterality Date  . ABDOMINAL HYSTERECTOMY    . BEDSIDE EVACUATION OF HEMATOMA Right 03/16/2015   Procedure:  EVACUATION OF HEMATOMA;  Surgeon: Renford DillsGregory G Chasmine Lender, MD;  Location: [facility];  Service: Vascular;  Laterality: Right;  .  bilateral wrist surgery    . cataract surgery Bilateral   . I&D EXTREMITY Right 04/06/2015   Procedure: IRRIGATION AND DEBRIDEMENT EXTREMITY;  Surgeon: Renford Dills, MD;  Location: [facility];  Service: Vascular;  Laterality: Right;  Wound matrix skin graft and wound vac placement  . JOINT REPLACEMENT Left 06/2014   hip  . left hip replacement    . MINOR APPLICATION OF WOUND VAC Right 03/16/2015   Procedure: MINOR APPLICATION OF WOUND VAC;  Surgeon: Renford Dills, MD;  Location: [facility];  Service: Vascular;  Laterality: Right;    Social History Social History  Substance Use  Topics  . Smoking status: Former Smoker    Quit date: 04/01/1984  . Smokeless tobacco: Never Used  . Alcohol use No    Family History Family History  Problem Relation Age of Onset  . Heart attack Father   . Stroke Mother     No Known Allergies   REVIEW OF SYSTEMS (Negative unless checked)  Constitutional: [] Weight loss  [] Fever  [] Chills Cardiac: [] Chest pain   [] Chest pressure   [] Palpitations   [] Shortness of breath at rest   [] Shortness of breath with exertion. Vascular:  [] Pain in legs with walking   [x] Pain in legs at rest    [] Pain in feet at rest    [] History of DVT   [] Phlebitis   [x] Swelling in legs   [x] Varicose veins   [] Non-healing ulcers Pulmonary:   [] Uses home oxygen   [] Productive cough   [] Hemoptysis   [] Wheeze  [] COPD   [] Asthma Neurologic:  [] Dizziness  [] Blackouts   [] Seizures   [] History of stroke   [] History of TIA  [] Aphasia   [] Temporary blindness   [] Dysphagia   [] Weakness or numbness in arm   [] Weakness or numbness in leg Musculoskeletal:  [] Arthritis   [] Joint swelling   [] Joint pain   [] Low back pain Hematologic:  [] Easy bruising  [] Easy bleeding   [] Hypercoagulable state   [] Anemic   Gastrointestinal:  [] Blood in stool   [] Vomiting blood  [] Gastroesophageal reflux/heartburn   [] Difficulty swallowing. Genitourinary:  [] Chronic kidney disease   [] Difficult urination  [] Frequent urination  [] Burning with urination   [] Blood in urine Skin:  [] Rashes   [] Ulcers   Psychological:  [] History of anxiety   []  History of major depression.    Physical Examination  Vitals:   04/23/16 1323  BP: (!) 144/84  Pulse: 94  Resp: 16  Weight: 209 lb (94.8 kg)  Height: 5\' 7"  (1.702 m)   Body mass index is 32.73 kg/m. Gen:  WD/WN, NAD Head: [place]/AT, No temporalis wasting. Ear/Nose/Throat: Hearing grossly intact, nares w/o erythema or drainage, trachea midline Eyes: PERR, EOM appear normal. Sclera non-icteric Neck: Supple, no nuchal rigidity.  No bruit or JVD.    Pulmonary:  Good air movement, equal and clear to auscultation bilaterally.  Cardiac: RRR, normal S1, S2, no Murmurs, rubs or gallops. Vascular:   3+ edema bilaterally with erythremia and tenderness Vessel Right Left  Radial Palpable Palpable  Ulnar Palpable Palpable  Brachial Palpable Palpable  Carotid Palpable Palpable  Aorta Not palpable N/A  Femoral Palpable Palpable  Popliteal Not Palpable Not Palpable  PT Not Palpable Not Palpable  DP Not Palpable Not Palpable   Gastrointestinal: soft, non-rigid/non-distended. No guarding.  Musculoskeletal: M/S 5/5 throughout.  No deformity or atrophy. Neurologic: CN 2-12 intact. Pain and light touch intact in extremities.  Speech is fluent. Motor exam as listed above. Psychiatric: Judgment intact, Mood & affect  appropriate for pt's clinical situation. Dermatologic: erythremia or ulcers noted.  No signs consistent with cellulitis. Lymphatic:  No cervical lymphadenopathy  CBC Lab Results  Component Value Date   WBC 7.0 10/05/2015   HGB 8.3 (L) 10/05/2015   HCT 26.2 (L) 10/05/2015   MCV 75.3 (L) 10/05/2015   PLT 219 10/05/2015    BMET    Component Value Date/Time   NA 136 10/05/2015 0429   NA 134 (L) 07/13/2014 0724   K 3.5 10/05/2015 0429   K 3.7 07/13/2014 0724   CL 100 (L) 10/05/2015 0429   CL 94 (L) 07/13/2014 0724   CO2 31 10/05/2015 0429   CO2 34 (H) 07/13/2014 0724   GLUCOSE 99 10/05/2015 0429   GLUCOSE 86 07/13/2014 0724   BUN 13 10/05/2015 0429   BUN 11 07/13/2014 0724   CREATININE 0.82 10/05/2015 0429   CREATININE 0.86 07/13/2014 0724   CALCIUM 8.2 (L) 10/05/2015 0429   CALCIUM 9.1 07/13/2014 0724   GFRNONAA >60 10/05/2015 0429   GFRNONAA >60 07/13/2014 0724   GFRAA >60 10/05/2015 0429   GFRAA >60 07/13/2014 0724   CrCl cannot be calculated (Patient's most recent lab result is older than the maximum 21 days allowed.).  COAG Lab Results  Component Value Date   INR 1.26 03/31/2015   INR 1.28 03/16/2015   INR  1.18 03/15/2015    Radiology No results found.    Assessment/Plan 1. Cellulitis of right lower extremity Will start Kelfex  Compression as soon as tolerated Follow up in 2 weeks  2. Venous insufficiency of both lower extremities   3. Acquired lymphedema of leg   4. Bilateral lower extremity edema     Levora Dredge, MD  04/23/2016 1:53 PM    This note was created with Dragon medical transcription system.  Any errors from dictation are purely unintentional

## 2016-04-30 ENCOUNTER — Ambulatory Visit (INDEPENDENT_AMBULATORY_CARE_PROVIDER_SITE_OTHER): Payer: Medicare Other | Admitting: Vascular Surgery

## 2016-04-30 ENCOUNTER — Encounter (INDEPENDENT_AMBULATORY_CARE_PROVIDER_SITE_OTHER): Payer: Self-pay | Admitting: Vascular Surgery

## 2016-04-30 VITALS — BP 126/85 | HR 99 | Resp 18 | Ht 66.5 in | Wt 206.0 lb

## 2016-04-30 DIAGNOSIS — E785 Hyperlipidemia, unspecified: Secondary | ICD-10-CM | POA: Diagnosis not present

## 2016-04-30 DIAGNOSIS — L03115 Cellulitis of right lower limb: Secondary | ICD-10-CM

## 2016-04-30 DIAGNOSIS — I872 Venous insufficiency (chronic) (peripheral): Secondary | ICD-10-CM | POA: Diagnosis not present

## 2016-04-30 NOTE — Progress Notes (Signed)
Subjective:    Patient ID: [name], female    DOB: 1940-08-15, 75 y.o.   MRN: [id number] Chief Complaint  Patient presents with  . Re-evaluation    Follow up, one week    Patient presents for a one week cellulitis follow up. States she has been taking her oral ABX and "trying to use her lymphedema pump" daily. Patient states she has farrow wraps however is not able to wear them as she states she does not have the dexterity to place them on. She informs her cellulitis has improved. Her legs are "less red" and "she feels better". Denies any fever, nausea, vomiting or worsening erythema of her legs. No ulcer formation.     Review of Systems  Constitutional: Negative.   HENT: Negative.   Eyes: Negative.   Respiratory: Negative.   Cardiovascular: Positive for leg swelling (Bilateral).  Gastrointestinal: Negative.   Endocrine: Negative.   Genitourinary: Negative.   Musculoskeletal: Negative.   Skin: Negative.   Allergic/Immunologic: Negative.   Neurological: Negative.   Hematological: Negative.   Psychiatric/Behavioral: Negative.        Objective:   Physical Exam  Constitutional: She is oriented to person, place, and time. She appears well-developed and well-nourished.  Obese  HENT:  Head: Normocephalic and atraumatic.  Eyes: Conjunctivae and EOM are normal. Pupils are equal, round, and reactive to light.  Neck: Normal range of motion.  Cardiovascular: Normal rate, regular rhythm and normal heart sounds.   Hard to assess pedal pulses due to edema hower bilateral extremities warm down to feet.   Pulmonary/Chest: Effort normal and breath sounds normal.  Abdominal: Soft. Bowel sounds are normal.  Musculoskeletal: Normal range of motion. She exhibits edema (Bilateral Moderate Pitting Edema).  Neurological: She is alert and oriented to person, place, and time.  Skin: Skin is warm and dry. No erythema.  Psychiatric: She has a normal mood and affect. Her behavior is  normal. Judgment and thought content normal.   BP 126/85   Pulse 99   Resp 18   Ht 5' 6.5" (1.689 m)   Wt 206 lb (93.4 kg)   LMP  (Exact Date)   BMI 32.75 kg/m   Past Medical History:  Diagnosis Date  . A-fib (HCC) 12/18/2014  . Atrial fibrillation (HCC)   . Benign essential HTN 10/25/2014  . Bilateral lower extremity edema 12/18/2014  . Carpal tunnel syndrome 12/30/2014  . Cellulitis of leg, right 12/18/2014  . Chronic systolic heart failure (HCC) 05/18/2014   Overview:  Global ef 35%   . Chronic venous insufficiency 12/29/2014  . Degenerative arthritis of hip 06/08/2014  . History of repair of hip joint 09/19/2014  . HLD (hyperlipidemia)   . HTN (hypertension) 12/18/2014  . Hypertension   . Lower extremity edema   . 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 Pulmonary HTN   . Valvular heart disease    Social History   Social History  . Marital status: Unknown    Spouse name: N/A  . Number of children: N/A  . Years of education: N/A   Occupational History  . Not on file.   Social History Main Topics  . Smoking status: Former Smoker    Quit date: 04/01/1984  . Smokeless tobacco: Never Used  . Alcohol use No  . Drug use: No  . Sexual activity: No   Other Topics Concern  . Not on file   Social History Narrative  . No narrative  on file   Past Surgical History:  Procedure Laterality Date  . ABDOMINAL HYSTERECTOMY    . BEDSIDE EVACUATION OF HEMATOMA Right 03/16/2015   Procedure:  EVACUATION OF HEMATOMA;  Surgeon: Renford DillsGregory G Schnier, MD;  Location: [facility];  Service: Vascular;  Laterality: Right;  . bilateral wrist surgery    . cataract surgery Bilateral   . I&D EXTREMITY Right 04/06/2015   Procedure: IRRIGATION AND DEBRIDEMENT EXTREMITY;  Surgeon: Renford DillsGregory G Schnier, MD;  Location: [facility];  Service: Vascular;  Laterality: Right;  Wound matrix skin graft and wound vac placement  . JOINT REPLACEMENT Left 06/2014   hip  . left  hip replacement    . MINOR APPLICATION OF WOUND VAC Right 03/16/2015   Procedure: MINOR APPLICATION OF WOUND VAC;  Surgeon: Renford DillsGregory G Schnier, MD;  Location: [facility];  Service: Vascular;  Laterality: Right;   Family History  Problem Relation Age of Onset  . Heart attack Father   . Stroke Mother    No Known Allergies     Assessment & Plan:  Patient presents for a one week cellulitis follow up. States she has been taking her oral ABX and "trying to use her lymphedema pump" daily. Patient states she has farrow wraps however is not able to wear them as she states she does not have the dexterity to place them on. She informs her cellulitis has improved. Her legs are "less red" and "she feels better". Denies any fever, nausea, vomiting or worsening erythema of her legs. No ulcer formation.   1. Cellulitis of right lower extremity - Improved Unfortunately patient is unwilling to wear compression which would greatly improve her bilateral lower extremity edema. We had a long conversation in regard to the importance of using her lymphedema pump. I encouraged her to use her pump twice daily for an hour each time. She was also instructed to elevate her legs. Patient to finish ABX course otherwise cellulitis has improved.   2. Chronic venous insufficiency - Stable Encouraged compression, elevation and twice daily use of lymphedema pump however patient is notoriously non-compliant.  3. Hyperlipidemia, unspecified hyperlipidemia type - Stable On ASA for medical optimization. Encouraged good control as its slows the progression of atherosclerotic disease.  Current Outpatient Prescriptions on File Prior to Visit  Medication Sig Dispense Refill  . acetaminophen (TYLENOL) 325 MG tablet Take 650 mg by mouth every 6 (six) hours as needed.    Marland Kitchen. aspirin EC 81 MG tablet Take 81 mg by mouth daily.    . carvedilol (COREG) 25 MG tablet Take 25 mg by mouth 2 (two) times daily with a meal.     . cephALEXin (KEFLEX)  500 MG capsule Take 1 capsule (500 mg total) by mouth 3 (three) times daily. 30 capsule 1  . clotrimazole-betamethasone (LOTRISONE) cream Apply 1 application topically 2 (two) times daily. 30 g 0  . docusate sodium (COLACE) 100 MG capsule Take 2 capsules (200 mg total) by mouth 2 (two) times daily. 10 capsule 0  . feeding supplement (BOOST / RESOURCE BREEZE) LIQD Take 1 Container by mouth daily.    . ferrous sulfate 325 (65 FE) MG EC tablet Take 325 mg by mouth 3 (three) times daily with meals.    . furosemide (LASIX) 40 MG tablet Take 40 mg by mouth every morning.    . gabapentin (NEURONTIN) 300 MG capsule Take 1 capsule (300 mg total) by mouth 2 (two) times daily. Reported on 08/22/2015 60 capsule 1  . nystatin-triamcinolone ointment (  MYCOLOG) Apply 1 application topically 2 (two) times daily. 30 g 0  . vitamin B-12 (CYANOCOBALAMIN) 100 MCG tablet Take 100 mcg by mouth daily.    Marland Kitchen amoxicillin (AMOXIL) 500 MG capsule Take 1 capsule (500 mg total) by mouth 3 (three) times daily. (Patient not taking: Reported on 04/30/2016) 21 capsule 0  . metroNIDAZOLE (FLAGYL) 500 MG tablet Take 1 tablet (500 mg total) by mouth 3 (three) times daily. (Patient not taking: Reported on 04/30/2016) 21 tablet 0   No current facility-administered medications on file prior to visit.     There are no Patient Instructions on file for this visit. Return if symptoms worsen or fail to improve.   Mykell Rawl A Arbor Leer, PA-C

## 2016-06-16 IMAGING — CR DG CHEST 1V PORT
1 series · 1 of 1 positions shown · non-contrast
Comparison: 06/25/2014.

CLINICAL DATA: TACHYPNEA. SHORTNESS OF BREATH. WEAKNESS FOR 2
WEEKS. PRODUCTIVE COUGH.

EXAM:
PORTABLE CHEST - 1 VIEW

[ap]
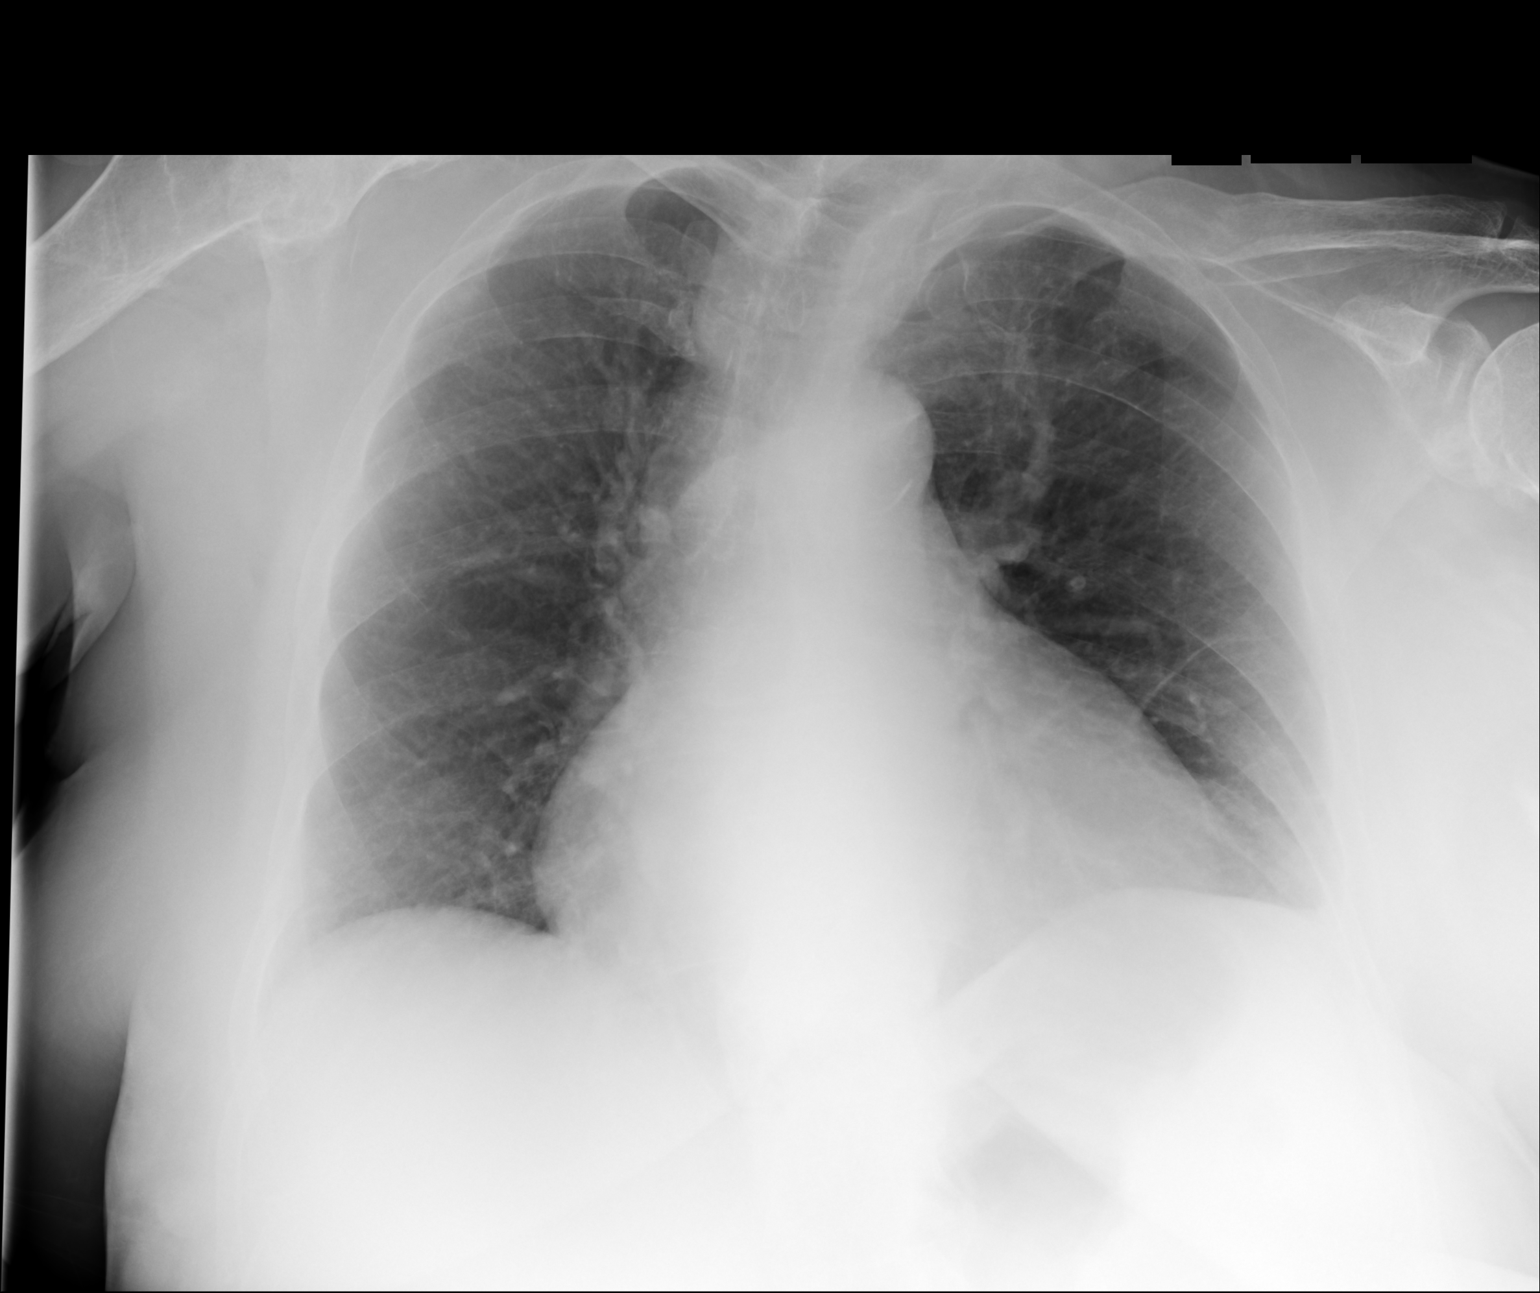

[1 of 1 positions shown; findings below may reference images not displayed]

FINDINGS: CARDIOPERICARDIAL SILHOUETTE IS ENLARGED, SIMILAR TO PRIOR. NO
AIRSPACE DISEASE. NO PLEURAL EFFUSION. LINEAR SUBSEGMENTAL
ATELECTASIS IN THE LEFT MID LUNG. CYSTIC CHANGES OVER THE RIGHT
SHOULDER, COMPATIBLE WITH OSTEOARTHRITIS. OLD LEFT CLAVICLE
FRACTURE. AORTIC ARCH ATHEROSCLEROSIS.
IMPRESSION: Cardiomegaly without failure.

## 2016-06-18 IMAGING — CT CT HEAD WITHOUT CONTRAST
2 series · 15 of 30 positions shown, 19 images · non-contrast
Comparison: Head CT 08/23/2014.

CLINICAL DATA: 73-year-old female with altered level of
consciousness.

EXAM:
CT HEAD WITHOUT CONTRAST
TECHNIQUE: Contiguous axial images were obtained from the base of the skull
through the vertex without intravenous contrast.

[Series 2: soft tissue · axial · 0.42mm/px · z∈[-106,+20]mm · 13 of 31 slices shown, 17 images]
[im 3/31  brain]
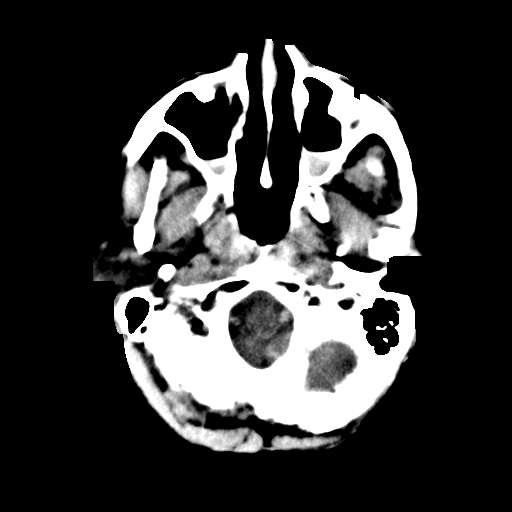
[im 3/31  bone]
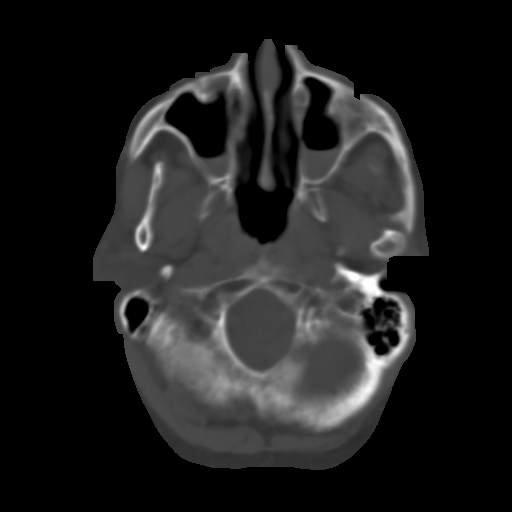
[im 5/31  brain]
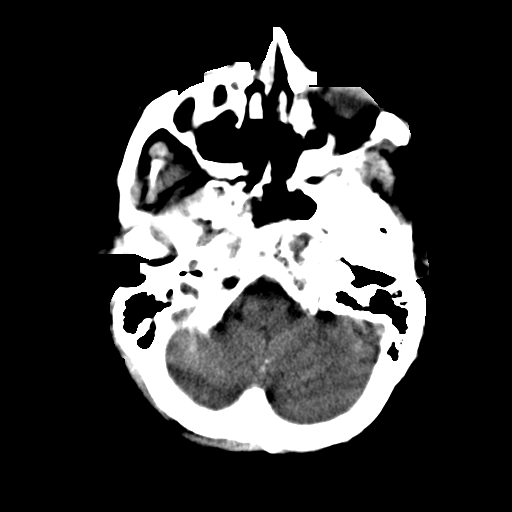
[im 7/31  brain]
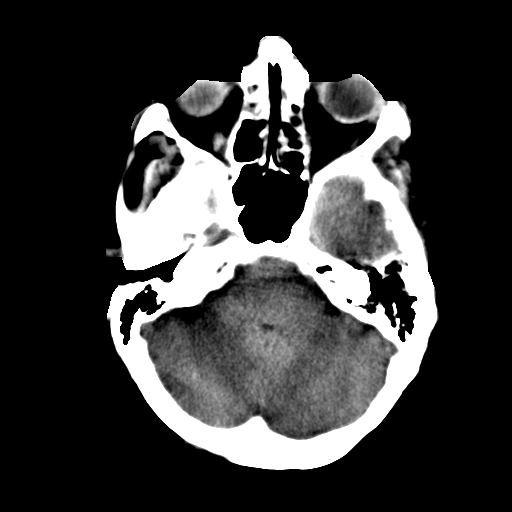
[im 9/31  brain]
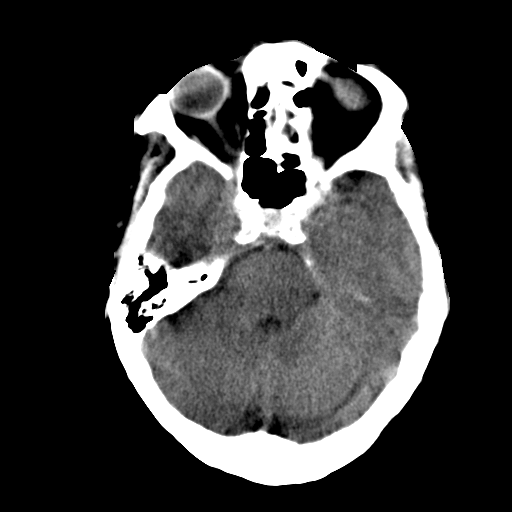
[im 11/31  brain]
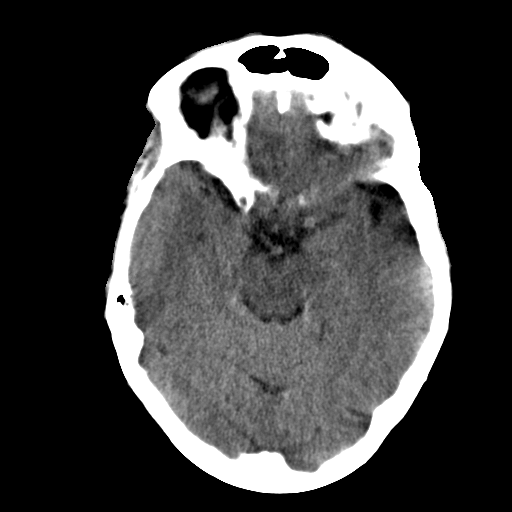
[im 11/31  bone]
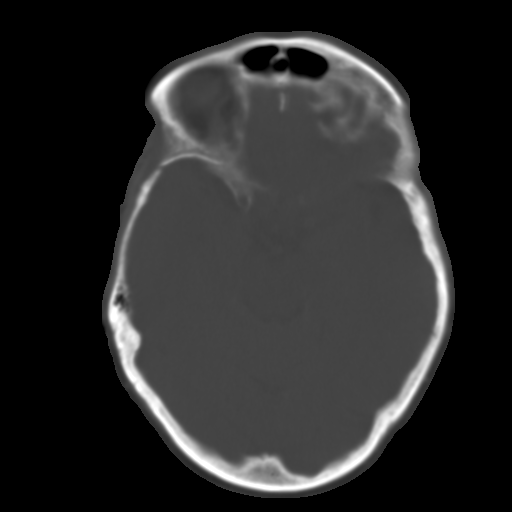
[im 13/31  brain]
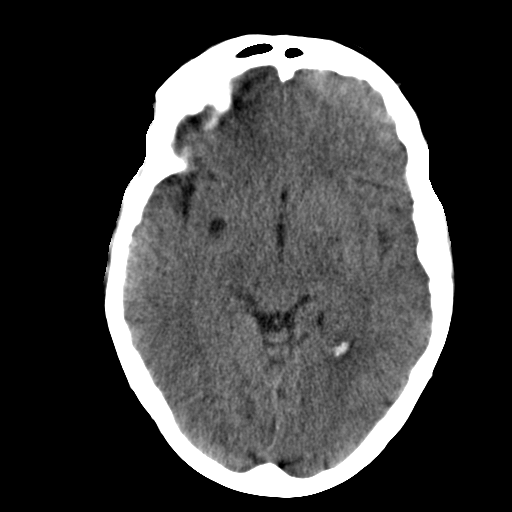
[im 16/31  brain]
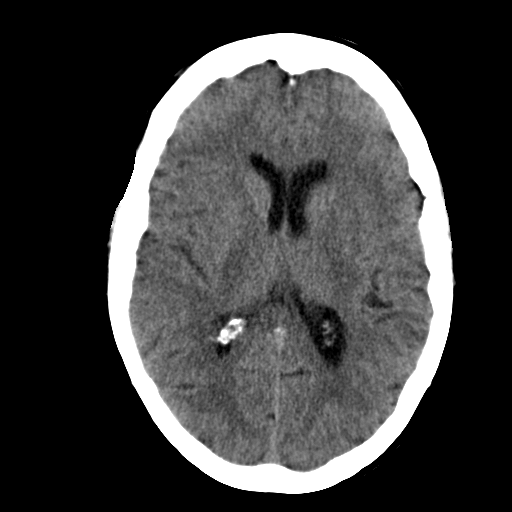
[im 18/31  brain]
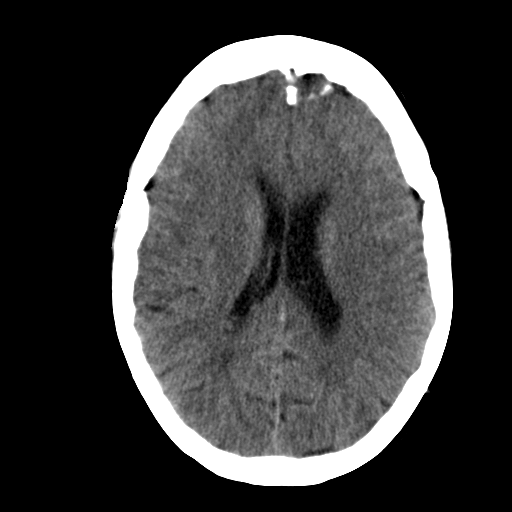
[im 20/31  brain]
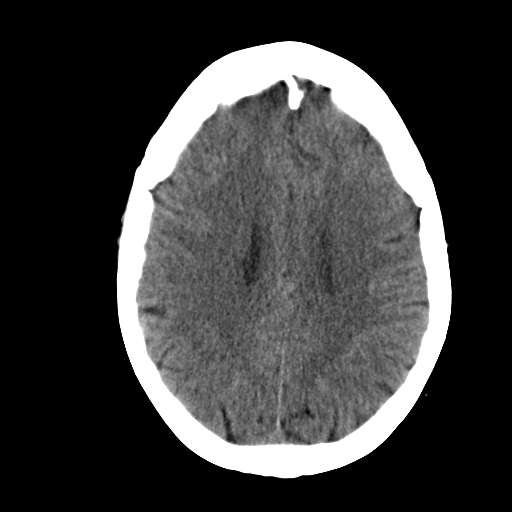
[im 20/31  bone]
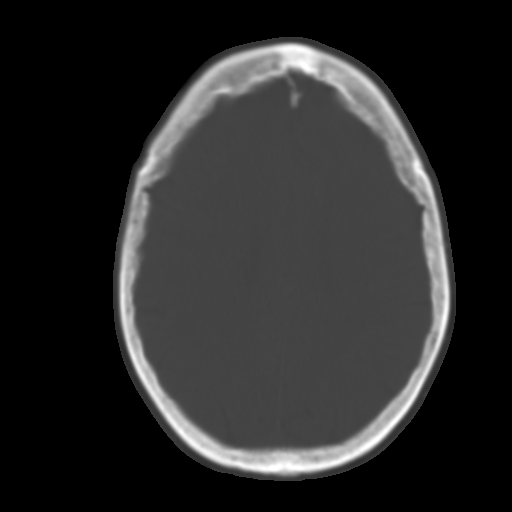
[im 22/31  brain]
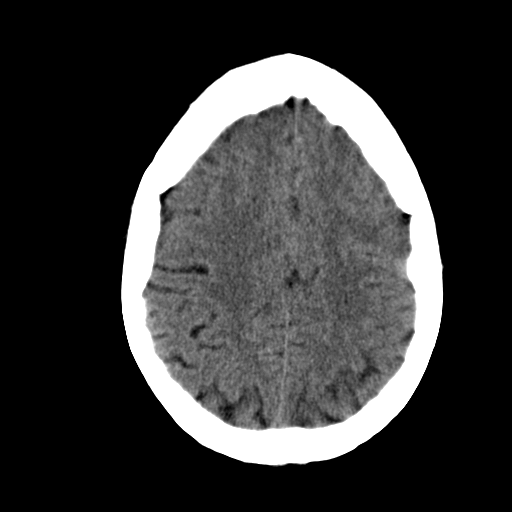
[im 24/31  brain]
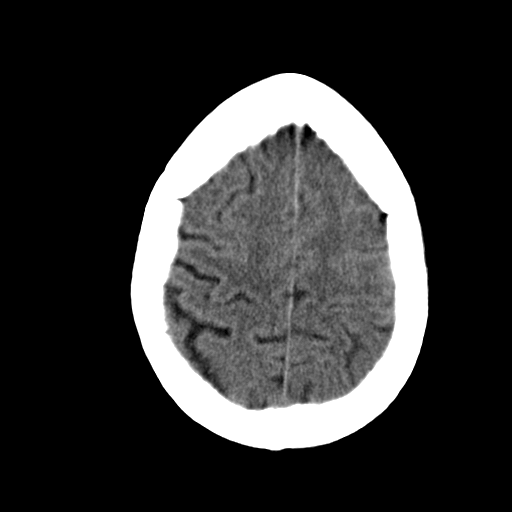
[im 26/31  brain]
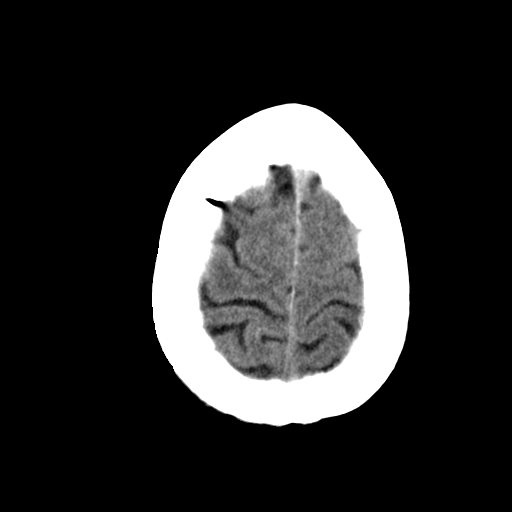
[im 28/31  brain]
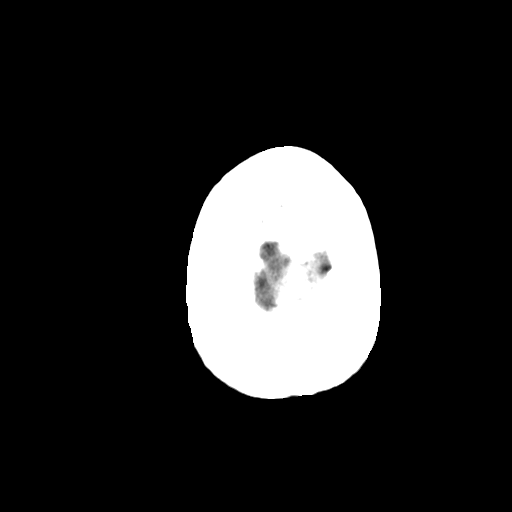
[im 28/31  bone]
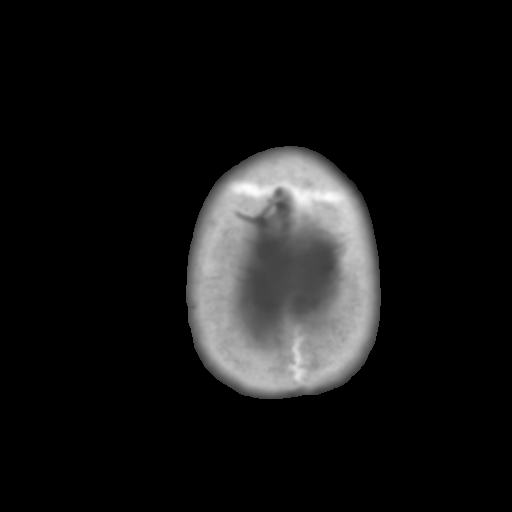

[Series 4: soft tissue recon · axial · 0.42mm/px · z∈[-47,-30]mm · 2 of 33 slices shown]
[im 3/33  brain]
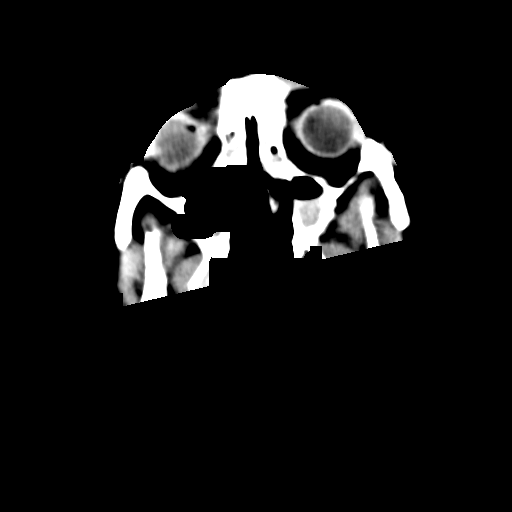
[im 7/33  brain]
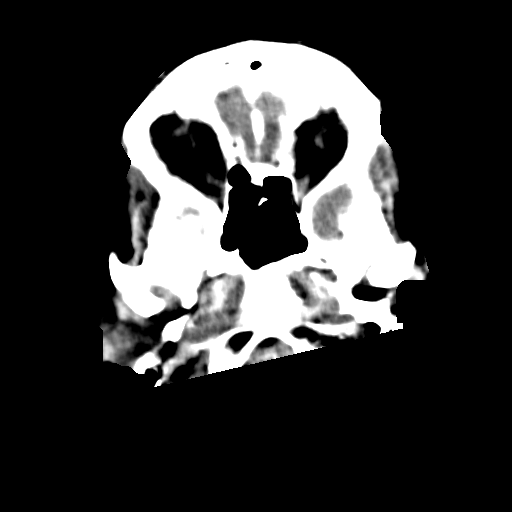

[15 of 30 positions shown; findings below may reference images not displayed]

FINDINGS: In the left frontal region immediately lateral to the falx cerebri
there is a peripherally calcified extra-axial lesion measuring
approximately 1.2 x 1.5 cm, which is similar in retrospect compared
to prior head CTs dating back to 05/04/2012, favored to represent a
small partially calcified meningioma; this is a benign finding and
stable in appearance. Well-defined low-attenuation in the right
basal ganglia (image 13 of series 2) is also unchanged, compatible
with old lacunar infarction. Faint physiologic calcifications in the
basal ganglia bilaterally. No acute intracranial abnormalities.
Specifically, no evidence of acute intracranial hemorrhage, no
definite findings of acute/subacute cerebral ischemia, no mass
effect, and no hydrocephalus or abnormal intra or extra-axial fluid
collections. No acute displaced skull fractures are identified.
Small air-fluid levels are again noted in the maxillary sinuses
bilaterally, and multiple ethmoid sinuses are opacified.
IMPRESSION: 1. No acute intracranial abnormalities.
2. Old lacunar infarct in the right basal ganglia is unchanged.
3. Small peripherally calcified extra-axial lesion in the left
frontal region is similar in retrospect prior examinations, likely
to represent a tiny calcified meningioma.
4. Paranasal sinus disease with evidence of acute maxillary
sinusitis bilaterally redemonstrated.

## 2016-06-25 ENCOUNTER — Ambulatory Visit (INDEPENDENT_AMBULATORY_CARE_PROVIDER_SITE_OTHER): Payer: Medicare Other | Admitting: Vascular Surgery

## 2016-06-25 ENCOUNTER — Encounter (INDEPENDENT_AMBULATORY_CARE_PROVIDER_SITE_OTHER): Payer: Self-pay | Admitting: Vascular Surgery

## 2016-06-25 VITALS — BP 141/88 | HR 105 | Resp 16 | Wt 211.0 lb

## 2016-06-25 DIAGNOSIS — M79605 Pain in left leg: Secondary | ICD-10-CM

## 2016-06-25 DIAGNOSIS — S93402A Sprain of unspecified ligament of left ankle, initial encounter: Secondary | ICD-10-CM | POA: Diagnosis not present

## 2016-06-25 DIAGNOSIS — I482 Chronic atrial fibrillation: Secondary | ICD-10-CM | POA: Diagnosis not present

## 2016-06-25 DIAGNOSIS — I10 Essential (primary) hypertension: Secondary | ICD-10-CM

## 2016-06-25 DIAGNOSIS — I872 Venous insufficiency (chronic) (peripheral): Secondary | ICD-10-CM

## 2016-06-25 DIAGNOSIS — I272 Pulmonary hypertension, unspecified: Secondary | ICD-10-CM | POA: Diagnosis not present

## 2016-06-25 DIAGNOSIS — I890 Lymphedema, not elsewhere classified: Secondary | ICD-10-CM | POA: Diagnosis not present

## 2016-06-25 DIAGNOSIS — S93409A Sprain of unspecified ligament of unspecified ankle, initial encounter: Secondary | ICD-10-CM | POA: Insufficient documentation

## 2016-06-25 DIAGNOSIS — I5022 Chronic systolic (congestive) heart failure: Secondary | ICD-10-CM | POA: Diagnosis not present

## 2016-06-28 ENCOUNTER — Telehealth: Payer: Self-pay

## 2016-06-28 NOTE — Telephone Encounter (Signed)
l mom to inform pt of LE venous DVT appt on 06/29/2016

## 2016-06-28 NOTE — Telephone Encounter (Signed)
This encounter was created in error - please disregard.

## 2016-07-03 ENCOUNTER — Encounter (HOSPITAL_COMMUNITY): Payer: Medicare Other

## 2016-07-09 NOTE — Progress Notes (Signed)
MRN : [id number]  [name] is a 76 y.o. ([birth year]) female who presents with chief complaint of  Chief Complaint  Patient presents with  . Follow-up  .  History of Present Illness: The patient is seen sooner than expected for increased pain in his left ankle.  She notes the pain began after she bent over to pick up something off the floor and stumbled and tisted her ankle.  Current Meds  Medication Sig  . acetaminophen (TYLENOL) 325 MG tablet Take 650 mg by mouth every 6 (six) hours as needed.  Marland Kitchen. amoxicillin (AMOXIL) 500 MG capsule Take 1 capsule (500 mg total) by mouth 3 (three) times daily.  Marland Kitchen. aspirin EC 81 MG tablet Take 81 mg by mouth daily.  . carvedilol (COREG) 25 MG tablet Take 25 mg by mouth 2 (two) times daily with a meal.   . clotrimazole-betamethasone (LOTRISONE) cream Apply 1 application topically 2 (two) times daily.  Marland Kitchen. docusate sodium (COLACE) 100 MG capsule Take 2 capsules (200 mg total) by mouth 2 (two) times daily.  . feeding supplement (BOOST / RESOURCE BREEZE) LIQD Take 1 Container by mouth daily.  . furosemide (LASIX) 40 MG tablet Take 40 mg by mouth every morning.  . gabapentin (NEURONTIN) 300 MG capsule Take 1 capsule (300 mg total) by mouth 2 (two) times daily. Reported on 08/22/2015  . nystatin-triamcinolone ointment (MYCOLOG) Apply 1 application topically 2 (two) times daily.  . vitamin B-12 (CYANOCOBALAMIN) 100 MCG tablet Take 100 mcg by mouth daily.    Past Medical History:  Diagnosis Date  . A-fib (HCC) 12/18/2014  . Atrial fibrillation (HCC)   . Benign essential HTN 10/25/2014  . Bilateral lower extremity edema 12/18/2014  . Carpal tunnel syndrome 12/30/2014  . Cellulitis of leg, right 12/18/2014  . Chronic systolic heart failure (HCC) 05/18/2014   Overview:  Global ef 35%   . Chronic venous insufficiency 12/29/2014  . Degenerative arthritis of hip 06/08/2014  . History of repair of hip joint 09/19/2014  . HLD (hyperlipidemia)   . HTN  (hypertension) 12/18/2014  . Hypertension   . Lower extremity edema   . 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 Pulmonary HTN   . Valvular heart disease     Past Surgical History:  Procedure Laterality Date  . ABDOMINAL HYSTERECTOMY    . BEDSIDE EVACUATION OF HEMATOMA Right 03/16/2015   Procedure:  EVACUATION OF HEMATOMA;  Surgeon: Renford DillsGregory G Lille Karim, MD;  Location: [facility];  Service: Vascular;  Laterality: Right;  . bilateral wrist surgery    . cataract surgery Bilateral   . I&D EXTREMITY Right 04/06/2015   Procedure: IRRIGATION AND DEBRIDEMENT EXTREMITY;  Surgeon: Renford DillsGregory G Pamella Samons, MD;  Location: [facility];  Service: Vascular;  Laterality: Right;  Wound matrix skin graft and wound vac placement  . JOINT REPLACEMENT Left 06/2014   hip  . left hip replacement    . MINOR APPLICATION OF WOUND VAC Right 03/16/2015   Procedure: MINOR APPLICATION OF WOUND VAC;  Surgeon: Renford DillsGregory G Latoyia Tecson, MD;  Location: [facility];  Service: Vascular;  Laterality: Right;    Social History Social History  Substance Use Topics  . Smoking status: Former Smoker    Quit date: 04/01/1984  . Smokeless tobacco: Never Used  . Alcohol use No    Family History Family History  Problem Relation Age of Onset  . Heart attack Father   . Stroke Mother   No family history of  bleeding/clotting disorders, porphyria or autoimmune disease   No Known Allergies   REVIEW OF SYSTEMS (Negative unless checked)  Constitutional: [] Weight loss  [] Fever  [] Chills Cardiac: [] Chest pain   [] Chest pressure   [] Palpitations   [] Shortness of breath when laying flat   [] Shortness of breath with exertion. Vascular:  [] Pain in legs with walking   [] Pain in legs at rest  [] History of DVT   [] Phlebitis   [] Swelling in legs   [] Varicose veins   [] Non-healing ulcers Pulmonary:   [] Uses home oxygen   [] Productive cough   [] Hemoptysis   [] Wheeze  [] COPD   [] Asthma Neurologic:  [] Dizziness    [] Seizures   [] History of stroke   [] History of TIA  [] Aphasia   [] Vissual changes   [] Weakness or numbness in arm   [] Weakness or numbness in leg Musculoskeletal:   [] Joint swelling   [] Joint pain   [] Low back pain Hematologic:  [] Easy bruising  [] Easy bleeding   [] Hypercoagulable state   [] Anemic Gastrointestinal:  [] Diarrhea   [] Vomiting  [] Gastroesophageal reflux/heartburn   [] Difficulty swallowing. Genitourinary:  [] Chronic kidney disease   [] Difficult urination  [] Frequent urination   [] Blood in urine Skin:  [] Rashes   [] Ulcers  Psychological:  [] History of anxiety   []  History of major depression.  Physical Examination  Vitals:   2016-03-18 1520  BP: (!) 141/88  Pulse: (!) 105  Resp: 16  Weight: 211 lb (95.7 kg)   Body mass index is 33.55 kg/m. Gen: WD/WN, NAD Head: [place]/AT, No temporalis wasting.  Ear/Nose/Throat: Hearing grossly intact, nares w/o erythema or drainage, poor dentition Eyes: PER, EOMI, sclera nonicteric.  Neck: Supple, no masses.  No bruit or JVD.  Pulmonary:  Good air movement, clear to auscultation bilaterally, no use of accessory muscles.  Cardiac: RRR, normal S1, S2, no Murmurs. Vascular:  3+ edema bilaterally with increase swelling of the left, no ecchymosis; tender to palpation Vessel Right Left  Radial Palpable Palpable  Ulnar Palpable Palpable  Brachial Palpable Palpable  Carotid Palpable Palpable  Femoral Palpable Palpable  Popliteal Palpable Palpable  PT Palpable Palpable  DP Palpable Palpable   Gastrointestinal: soft, non-distended. No guarding/no peritoneal signs.  Musculoskeletal: M/S 5/5 throughout.  No deformity or atrophy.  Neurologic: CN 2-12 intact. Pain and light touch intact in extremities.  Symmetrical.  Speech is fluent. Motor exam as listed above. Psychiatric: Judgment intact, Mood & affect appropriate for pt's clinical situation. Dermatologic: No rashes or ulcers noted.  No changes consistent with cellulitis. Lymph : No Cervical  lymphadenopathy, no lichenification or skin changes of chronic lymphedema.  CBC Lab Results  Component Value Date   WBC 7.0 10/05/2015   HGB 8.3 (L) 10/05/2015   HCT 26.2 (L) 10/05/2015   MCV 75.3 (L) 10/05/2015   PLT 219 10/05/2015    BMET    Component Value Date/Time   NA 136 10/05/2015 0429   NA 134 (L) 07/13/2014 0724   K 3.5 10/05/2015 0429   K 3.7 07/13/2014 0724   CL 100 (L) 10/05/2015 0429   CL 94 (L) 07/13/2014 0724   CO2 31 10/05/2015 0429   CO2 34 (H) 07/13/2014 0724   GLUCOSE 99 10/05/2015 0429   GLUCOSE 86 07/13/2014 0724   BUN 13 10/05/2015 0429   BUN 11 07/13/2014 0724   CREATININE 0.82 10/05/2015 0429   CREATININE 0.86 07/13/2014 0724   CALCIUM 8.2 (L) 10/05/2015 0429   CALCIUM 9.1 07/13/2014 0724   GFRNONAA >60 10/05/2015 0429   GFRNONAA >60  07/13/2014 0724   GFRAA >60 10/05/2015 0429   GFRAA >60 07/13/2014 0724   CrCl cannot be calculated (Patient's most recent lab result is older than the maximum 21 days allowed.).  COAG Lab Results  Component Value Date   INR 1.26 03/31/2015   INR 1.28 03/16/2015   INR 1.18 03/15/2015    Radiology No results found.   Assessment/Plan 1. Sprain of left ankle, unspecified ligament, initial encounter I believe her increased pain is secondary to her recent trauma - DG Ankle Complete Left; Future - Ambulatory referral to Orthopedic Surgery  2. Leg pain, left Combination of #1 and #3 - VAS [place] LOWER EXTREMITY VENOUS (DVT); Future  3. Venous insufficiency of both lower extremities No surgery or intervention at this point in time.    I have reviewed my discussion with the patient regarding venous insufficiency and secondary lymph edema and why it  causes symptoms. I have discussed with the patient the chronic skin changes that accompany these problems and the long term sequela such as ulceration and infection.  Patient will continue wearing graduated compression stockings class 1 (20-30 mmHg) on a daily basis a  prescription was given to the patient to keep this updated. The patient will  put the stockings on first thing in the morning and removing them in the evening. The patient is instructed specifically not to sleep in the stockings.  In addition, behavioral modification including elevation during the day will be continued.  Diet and salt restriction was also discussed.  Previous duplex ultrasound of the lower extremities shows normal deep venous system, superficial reflux was not present.   4. Benign essential HTN Continue antihypertensive medications as already ordered and reviewed, no changes at this time.  Continue statin as ordered and reviewed, no changes at this time  5. Acquired lymphedema of leg See #3    Levora Dredge, MD  July 03, 2016 3:50 PM

## 2016-07-09 NOTE — Progress Notes (Signed)
fol

## 2016-07-09 DEATH — deceased

## 2016-07-26 ENCOUNTER — Ambulatory Visit (INDEPENDENT_AMBULATORY_CARE_PROVIDER_SITE_OTHER): Payer: Self-pay | Admitting: Vascular Surgery

## 2017-01-04 IMAGING — CR DG TIBIA/FIBULA 2V*R*
1 series · 2 of 2 positions shown · non-contrast
Comparison: None.

CLINICAL DATA: Status post fall, with mid right lateral lower leg
pain. Initial encounter.

EXAM:
RIGHT TIBIA AND FIBULA - 2 VIEW

[Series 1: dg tibia/fibula right · 0.14mm/px · 2 of 2 slices shown]
[im 1/2]
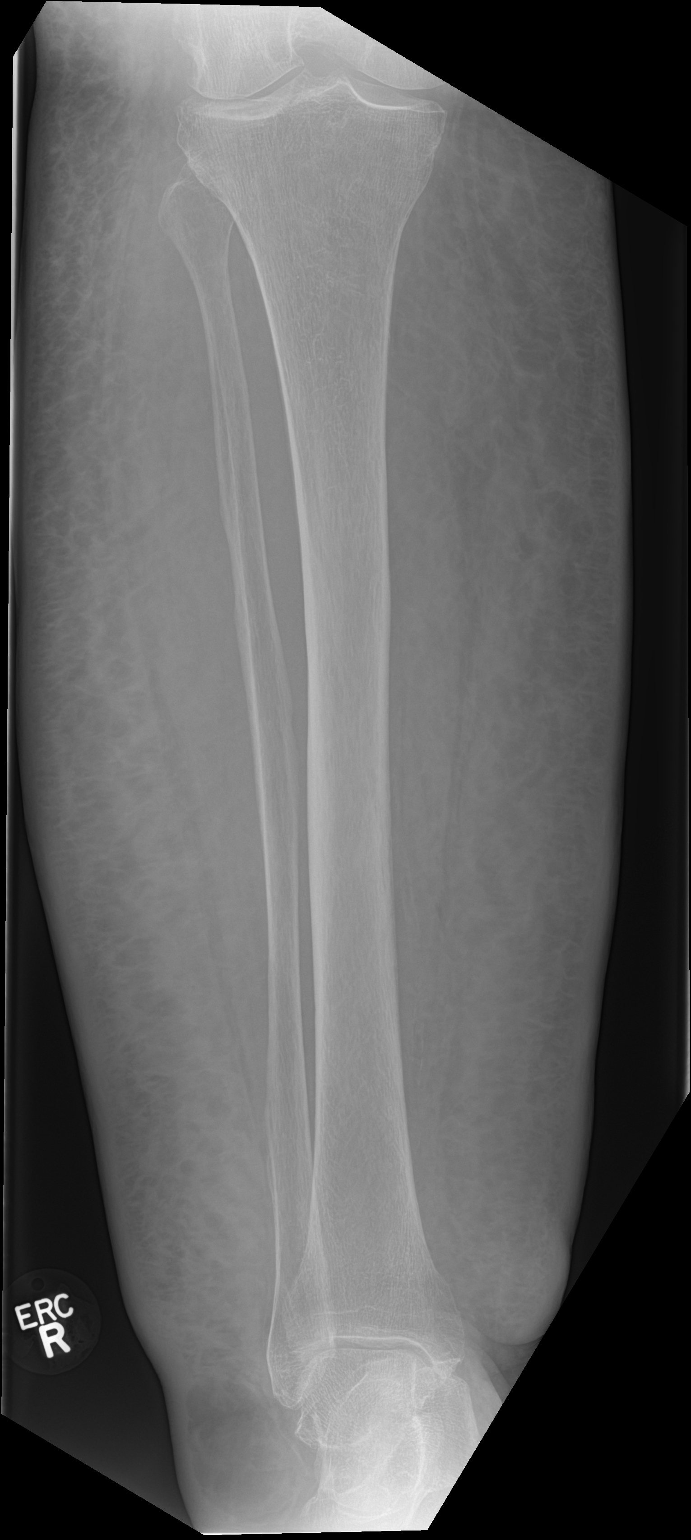
[im 2/2]
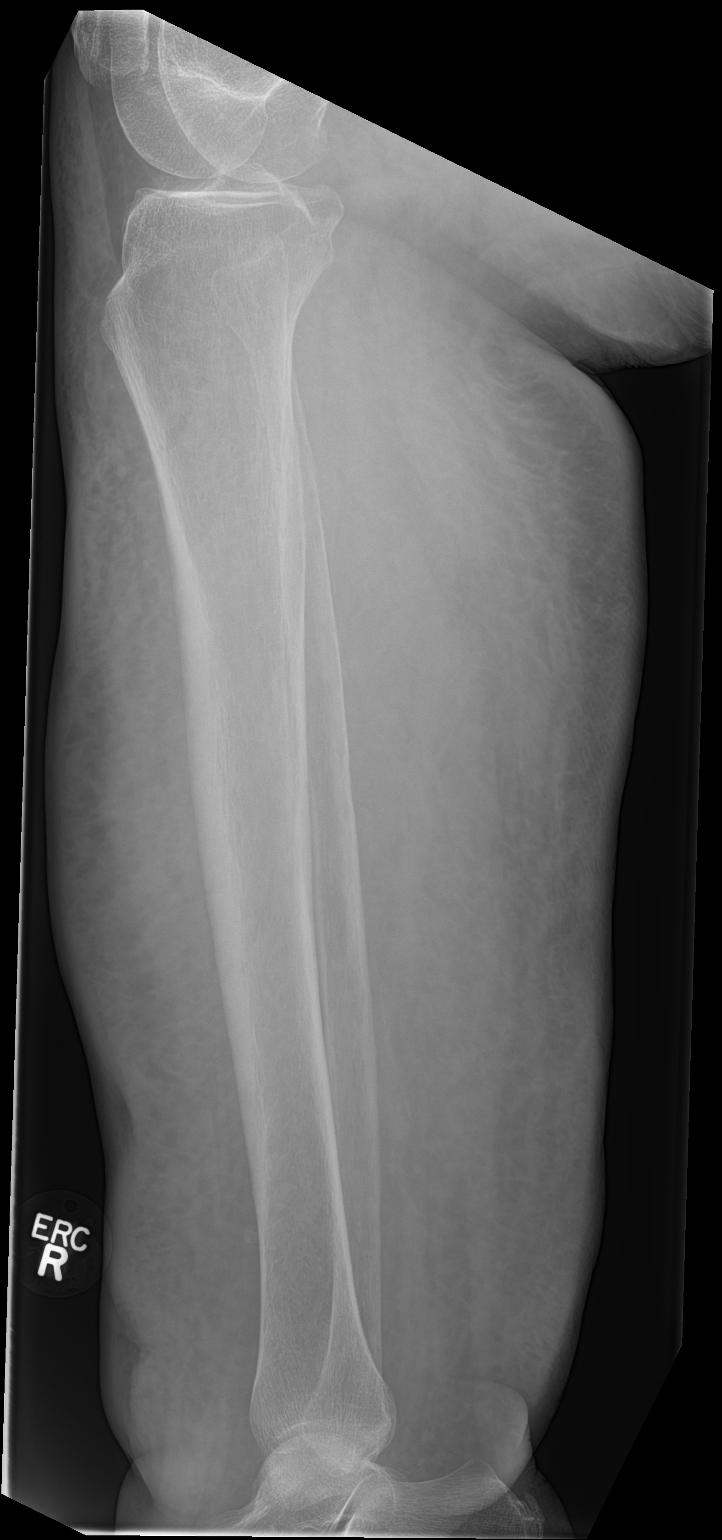

[2 of 2 positions shown; findings below may reference images not displayed]

FINDINGS: There is no evidence of fracture or dislocation. The tibia and
fibula appear intact. The ankle mortise is incompletely assessed,
but appears grossly unremarkable. The knee joint is not fully
characterized. Marginal osteophytes are seen at the lateral
compartment. Diffuse chronic soft tissue swelling is suggested.
IMPRESSION: No evidence of fracture or dislocation.

## 2017-07-28 IMAGING — CR DG CHEST 2V
2 series · 3 of 3 positions shown · non-contrast
Comparison: March 31, 2015.

CLINICAL DATA: Shortness of breath.

EXAM:
CHEST  2 VIEW

[Series 2: chest lat · 0.14mm/px · 2 of 2 slices shown]
[im 1/2]
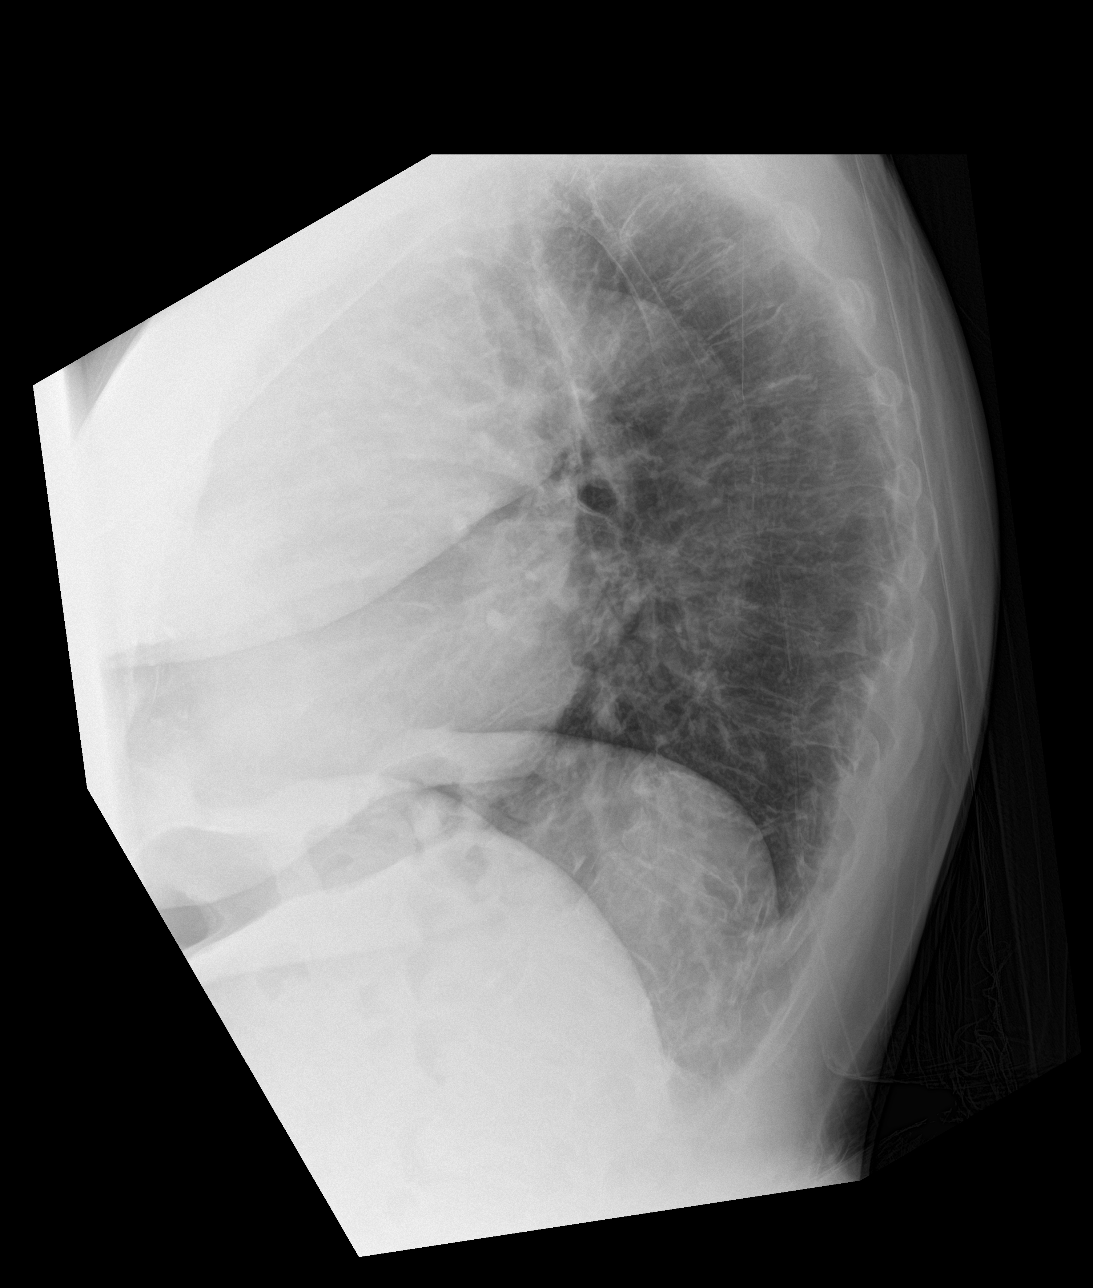
[im 2/2]
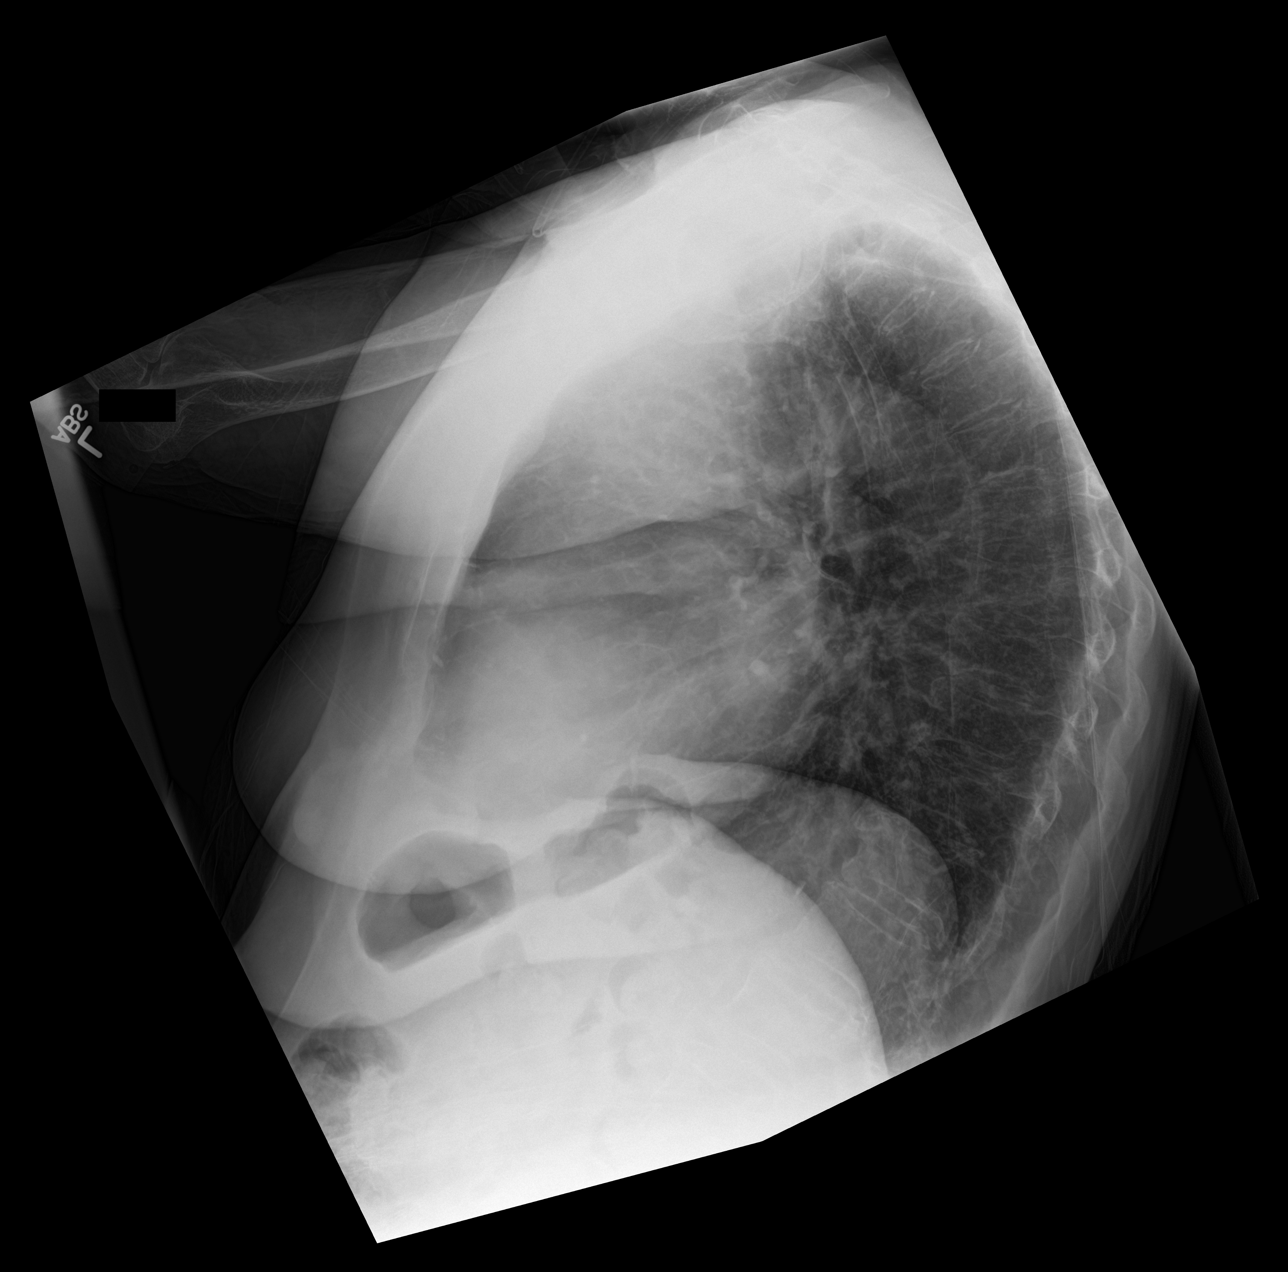

[chest ap]
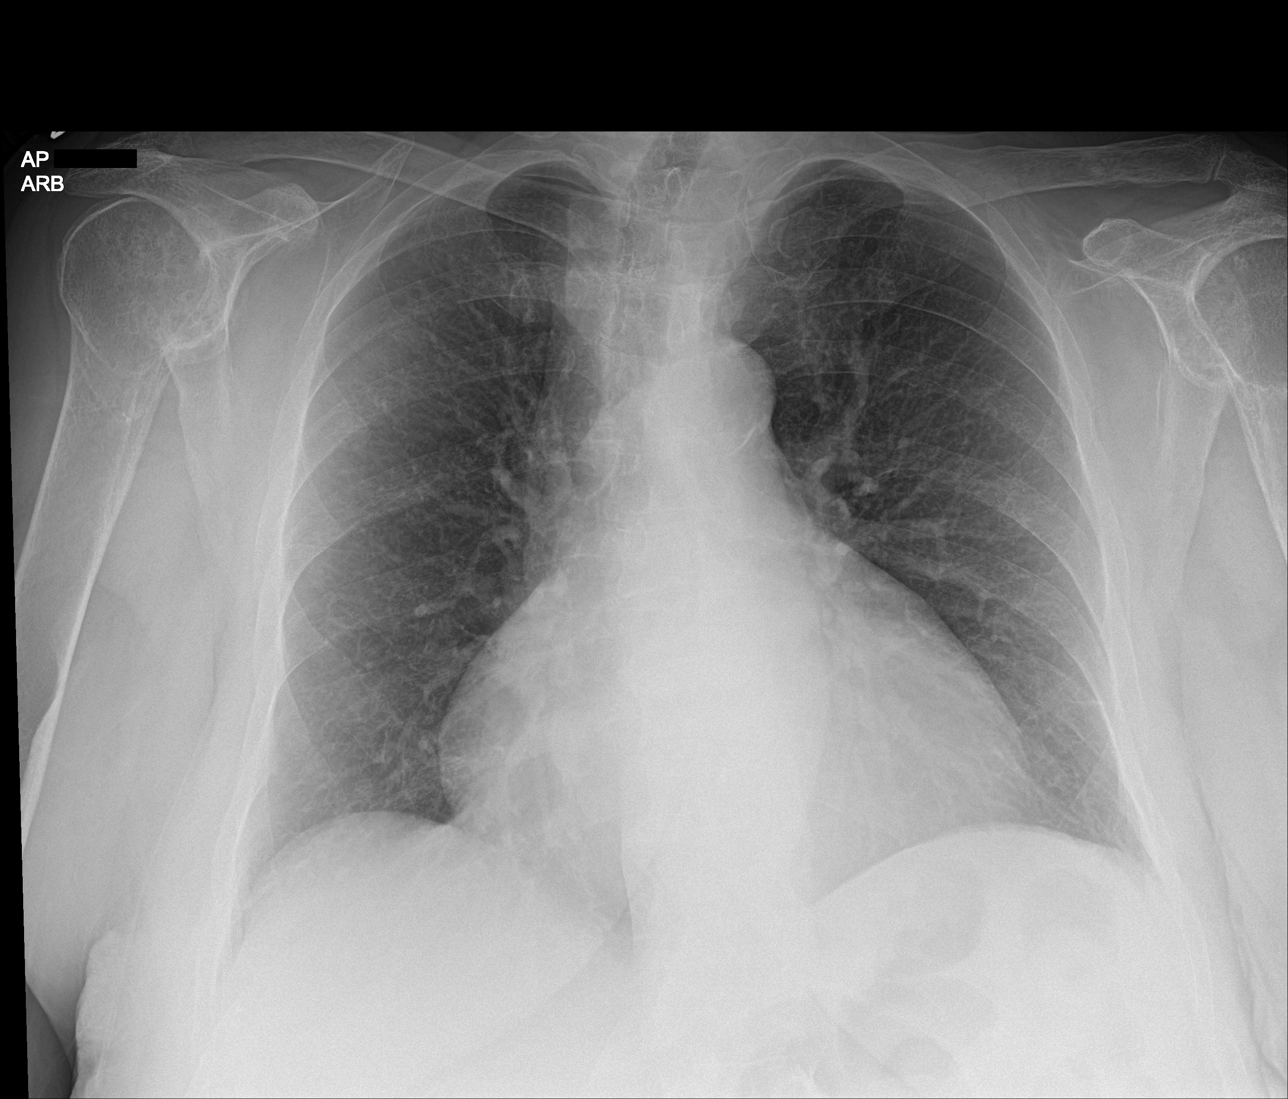

[3 of 3 positions shown; findings below may reference images not displayed]

FINDINGS: Stable cardiomegaly. No pneumothorax or pleural effusion is noted.
No acute pulmonary disease is noted. Bony thorax is unremarkable.
IMPRESSION: No active cardiopulmonary disease.
